# Patient Record
Sex: Female | Born: 1990 | Race: White | Hispanic: No
Health system: Southern US, Academic
[De-identification: ages and names within clinical notes are randomized; demographics above are authoritative.]

## PROBLEM LIST (undated history)

## (undated) ENCOUNTER — Encounter
Attending: Pharmacist Clinician (PhC)/ Clinical Pharmacy Specialist | Primary: Pharmacist Clinician (PhC)/ Clinical Pharmacy Specialist

## (undated) ENCOUNTER — Encounter

## (undated) ENCOUNTER — Encounter: Attending: Hematology & Oncology | Primary: Hematology & Oncology

## (undated) ENCOUNTER — Ambulatory Visit: Payer: PRIVATE HEALTH INSURANCE

## (undated) ENCOUNTER — Telehealth
Attending: Pharmacist Clinician (PhC)/ Clinical Pharmacy Specialist | Primary: Pharmacist Clinician (PhC)/ Clinical Pharmacy Specialist

## (undated) ENCOUNTER — Ambulatory Visit

## (undated) ENCOUNTER — Telehealth: Attending: Hematology & Oncology | Primary: Hematology & Oncology

## (undated) ENCOUNTER — Inpatient Hospital Stay: Payer: Self-pay

## (undated) DIAGNOSIS — F319 Bipolar disorder, unspecified: Secondary | ICD-10-CM

## (undated) DIAGNOSIS — J45909 Unspecified asthma, uncomplicated: Secondary | ICD-10-CM

## (undated) DIAGNOSIS — F329 Major depressive disorder, single episode, unspecified: Secondary | ICD-10-CM

## (undated) DIAGNOSIS — F1990 Other psychoactive substance use, unspecified, uncomplicated: Secondary | ICD-10-CM

## (undated) DIAGNOSIS — F32A Depression, unspecified: Secondary | ICD-10-CM

## (undated) DIAGNOSIS — F1290 Cannabis use, unspecified, uncomplicated: Secondary | ICD-10-CM

## (undated) DIAGNOSIS — R569 Unspecified convulsions: Secondary | ICD-10-CM

## (undated) DIAGNOSIS — F419 Anxiety disorder, unspecified: Secondary | ICD-10-CM

## (undated) DIAGNOSIS — D649 Anemia, unspecified: Secondary | ICD-10-CM

## (undated) DIAGNOSIS — I959 Hypotension, unspecified: Secondary | ICD-10-CM

## (undated) DIAGNOSIS — A6000 Herpesviral infection of urogenital system, unspecified: Secondary | ICD-10-CM

## (undated) HISTORY — DX: Bipolar disorder, unspecified: F31.9

## (undated) HISTORY — DX: Anxiety disorder, unspecified: F41.9

## (undated) HISTORY — DX: Depression, unspecified: F32.A

## (undated) HISTORY — DX: Cannabis use, unspecified, uncomplicated: F12.90

## (undated) HISTORY — DX: Major depressive disorder, single episode, unspecified: F32.9

---
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
## 2011-04-06 ENCOUNTER — Ambulatory Visit: Payer: Self-pay

## 2013-01-12 ENCOUNTER — Emergency Department: Payer: Self-pay | Admitting: Emergency Medicine

## 2013-01-12 LAB — CBC
HCT: 43.8 % (ref 35.0–47.0)
MCH: 31.7 pg (ref 26.0–34.0)
Platelet: 268 10*3/uL (ref 150–440)
RBC: 4.73 10*6/uL (ref 3.80–5.20)

## 2013-01-12 LAB — COMPREHENSIVE METABOLIC PANEL
Albumin: 4 g/dL (ref 3.4–5.0)
Alkaline Phosphatase: 87 U/L (ref 50–136)
Co2: 20 mmol/L — ABNORMAL LOW (ref 21–32)
EGFR (Non-African Amer.): >60
Glucose: 86 mg/dL (ref 65–99)
Osmolality: 277 (ref 275–301)
Potassium: 4 mmol/L (ref 3.5–5.1)
SGPT (ALT): 19 U/L (ref 12–78)
Sodium: 139 mmol/L (ref 136–145)
Total Protein: 8.3 g/dL — ABNORMAL HIGH (ref 6.4–8.2)

## 2013-01-12 LAB — URINALYSIS, COMPLETE
Bilirubin,UR: NEGATIVE
Blood: NEGATIVE
Glucose,UR: NEGATIVE mg/dL (ref 0–75)
Ketone: NEGATIVE
Nitrite: NEGATIVE
Protein: NEGATIVE
RBC,UR: 9 /HPF (ref 0–5)
Specific Gravity: 1.027 (ref 1.003–1.030)
WBC UR: 12 /HPF (ref 0–5)

## 2013-01-12 LAB — DRUG SCREEN, URINE
Amphetamines, Ur Screen: NEGATIVE (ref ?–1000)
Barbiturates, Ur Screen: NEGATIVE (ref ?–200)
Cocaine Metabolite,Ur ~~LOC~~: POSITIVE (ref ?–300)
MDMA (Ecstasy)Ur Screen: NEGATIVE (ref ?–500)
Methadone, Ur Screen: NEGATIVE (ref ?–300)
Phencyclidine (PCP) Ur S: NEGATIVE (ref ?–25)

## 2013-01-12 LAB — HCG, QUANTITATIVE, PREGNANCY: Beta Hcg, Quant.: <1 m[IU]/mL — ABNORMAL LOW

## 2013-01-12 LAB — TSH: Thyroid Stimulating Horm: 0.76 u[IU]/mL

## 2013-01-21 ENCOUNTER — Emergency Department: Payer: Self-pay | Admitting: Emergency Medicine

## 2013-01-21 LAB — BASIC METABOLIC PANEL
Anion Gap: 6 — ABNORMAL LOW (ref 7–16)
BUN: 7 mg/dL (ref 7–18)
Chloride: 104 mmol/L (ref 98–107)
Co2: 29 mmol/L (ref 21–32)
Creatinine: 0.78 mg/dL (ref 0.60–1.30)
Osmolality: 275 (ref 275–301)

## 2013-01-21 LAB — CBC
HCT: 38.9 % (ref 35.0–47.0)
MCHC: 34.6 g/dL (ref 32.0–36.0)
Platelet: 190 10*3/uL (ref 150–440)
RBC: 4.23 10*6/uL (ref 3.80–5.20)
WBC: 6 10*3/uL (ref 3.6–11.0)

## 2013-01-21 LAB — CARBAMAZEPINE LEVEL, TOTAL: Carbamazepine: 9.1 ug/mL (ref 4.0–12.0)

## 2013-02-06 ENCOUNTER — Emergency Department: Payer: Self-pay | Admitting: Emergency Medicine

## 2013-06-16 ENCOUNTER — Emergency Department: Payer: Self-pay | Admitting: Emergency Medicine

## 2013-06-16 LAB — URINALYSIS, COMPLETE
BACTERIA: NONE SEEN
BILIRUBIN, UR: NEGATIVE
Blood: NEGATIVE
Glucose,UR: NEGATIVE mg/dL (ref 0–75)
Ketone: NEGATIVE
NITRITE: NEGATIVE
PH: 7 (ref 4.5–8.0)
Protein: NEGATIVE
RBC, UR: NONE SEEN /HPF (ref 0–5)
Specific Gravity: 1.005 (ref 1.003–1.030)
Squamous Epithelial: 18
WBC UR: 4 /HPF (ref 0–5)

## 2013-06-16 LAB — COMPREHENSIVE METABOLIC PANEL
ALK PHOS: 70 U/L
ALT: 12 U/L (ref 12–78)
ANION GAP: 7 (ref 7–16)
AST: 10 U/L — AB (ref 15–37)
Albumin: 4.2 g/dL (ref 3.4–5.0)
BUN: 7 mg/dL (ref 7–18)
Bilirubin,Total: 0.4 mg/dL (ref 0.2–1.0)
CO2: 27 mmol/L (ref 21–32)
CREATININE: 0.83 mg/dL (ref 0.60–1.30)
Calcium, Total: 9.3 mg/dL (ref 8.5–10.1)
Chloride: 104 mmol/L (ref 98–107)
EGFR (Non-African Amer.): >60
Glucose: 83 mg/dL (ref 65–99)
OSMOLALITY: 273 (ref 275–301)
Potassium: 3.4 mmol/L — ABNORMAL LOW (ref 3.5–5.1)
Sodium: 138 mmol/L (ref 136–145)
TOTAL PROTEIN: 7.7 g/dL (ref 6.4–8.2)

## 2013-06-16 LAB — GC/CHLAMYDIA PROBE AMP

## 2013-06-16 LAB — CBC
HCT: 42.2 % (ref 35.0–47.0)
HGB: 14.3 g/dL (ref 12.0–16.0)
MCH: 31.3 pg (ref 26.0–34.0)
MCHC: 33.9 g/dL (ref 32.0–36.0)
MCV: 92 fL (ref 80–100)
PLATELETS: 236 10*3/uL (ref 150–440)
RBC: 4.58 10*6/uL (ref 3.80–5.20)
RDW: 12.9 % (ref 11.5–14.5)
WBC: 9.5 10*3/uL (ref 3.6–11.0)

## 2013-06-16 LAB — WET PREP, GENITAL

## 2013-06-16 LAB — LIPASE, BLOOD: LIPASE: 70 U/L — AB (ref 73–393)

## 2013-10-30 ENCOUNTER — Emergency Department: Payer: Self-pay | Admitting: Emergency Medicine

## 2013-10-30 LAB — URINALYSIS, COMPLETE
BILIRUBIN, UR: NEGATIVE
BLOOD: NEGATIVE
Glucose,UR: NEGATIVE mg/dL (ref 0–75)
Ketone: NEGATIVE
Nitrite: NEGATIVE
PH: 5 (ref 4.5–8.0)
RBC,UR: 27 /HPF (ref 0–5)
Specific Gravity: 1.03 (ref 1.003–1.030)
WBC UR: 196 /HPF (ref 0–5)

## 2013-10-30 LAB — PREGNANCY, URINE: PREGNANCY TEST, URINE: NEGATIVE m[IU]/mL

## 2013-12-23 ENCOUNTER — Emergency Department: Payer: Self-pay | Admitting: Emergency Medicine

## 2013-12-31 ENCOUNTER — Emergency Department: Payer: Self-pay | Admitting: Emergency Medicine

## 2014-01-01 LAB — CBC WITH DIFFERENTIAL/PLATELET
Basophil #: 0 10*3/uL (ref 0.0–0.1)
Basophil %: 0.1 %
EOS PCT: 0 %
Eosinophil #: 0 10*3/uL (ref 0.0–0.7)
HCT: 39.5 % (ref 35.0–47.0)
HGB: 13.6 g/dL (ref 12.0–16.0)
Lymphocyte #: 1.4 10*3/uL (ref 1.0–3.6)
Lymphocyte %: 12.5 %
MCH: 32.2 pg (ref 26.0–34.0)
MCHC: 34.3 g/dL (ref 32.0–36.0)
MCV: 94 fL (ref 80–100)
Monocyte #: 0.5 x10 3/mm (ref 0.2–0.9)
Monocyte %: 4.4 %
Neutrophil #: 9.6 10*3/uL — ABNORMAL HIGH (ref 1.4–6.5)
Neutrophil %: 83 %
PLATELETS: 226 10*3/uL (ref 150–440)
RBC: 4.21 10*6/uL (ref 3.80–5.20)
RDW: 12.8 % (ref 11.5–14.5)
WBC: 11.6 10*3/uL — ABNORMAL HIGH (ref 3.6–11.0)

## 2014-01-01 LAB — URINALYSIS, COMPLETE
Bacteria: NONE SEEN
Bilirubin,UR: NEGATIVE
Blood: NEGATIVE
GLUCOSE, UR: NEGATIVE mg/dL (ref 0–75)
Leukocyte Esterase: NEGATIVE
Nitrite: NEGATIVE
PH: 6 (ref 4.5–8.0)
PROTEIN: NEGATIVE
RBC,UR: <1 /HPF (ref 0–5)
Specific Gravity: 1.011 (ref 1.003–1.030)
Squamous Epithelial: 5
WBC UR: 2 /HPF (ref 0–5)

## 2014-01-01 LAB — COMPREHENSIVE METABOLIC PANEL
Albumin: 3.8 g/dL (ref 3.4–5.0)
Alkaline Phosphatase: 59 U/L
Anion Gap: 10 (ref 7–16)
BUN: 4 mg/dL — ABNORMAL LOW (ref 7–18)
Bilirubin,Total: 0.3 mg/dL (ref 0.2–1.0)
CHLORIDE: 104 mmol/L (ref 98–107)
CO2: 25 mmol/L (ref 21–32)
Calcium, Total: 8.9 mg/dL (ref 8.5–10.1)
Creatinine: 0.49 mg/dL — ABNORMAL LOW (ref 0.60–1.30)
GLUCOSE: 82 mg/dL (ref 65–99)
Osmolality: 274 (ref 275–301)
Potassium: 3.3 mmol/L — ABNORMAL LOW (ref 3.5–5.1)
SGOT(AST): 10 U/L — ABNORMAL LOW (ref 15–37)
SGPT (ALT): 15 U/L
SODIUM: 139 mmol/L (ref 136–145)
Total Protein: 7.6 g/dL (ref 6.4–8.2)

## 2014-01-01 LAB — HCG, QUANTITATIVE, PREGNANCY: BETA HCG, QUANT.: 87575 m[IU]/mL — AB

## 2014-01-01 LAB — LIPASE, BLOOD: Lipase: 63 U/L — ABNORMAL LOW (ref 73–393)

## 2014-02-10 ENCOUNTER — Emergency Department: Payer: Self-pay | Admitting: Student

## 2014-02-10 LAB — COMPREHENSIVE METABOLIC PANEL
ALT: 17 U/L
AST: 10 U/L — AB (ref 15–37)
Albumin: 3.3 g/dL — ABNORMAL LOW (ref 3.4–5.0)
Alkaline Phosphatase: 55 U/L
Anion Gap: 8 (ref 7–16)
BUN: 4 mg/dL — ABNORMAL LOW (ref 7–18)
Bilirubin,Total: 0.3 mg/dL (ref 0.2–1.0)
CHLORIDE: 105 mmol/L (ref 98–107)
Calcium, Total: 8.7 mg/dL (ref 8.5–10.1)
Co2: 25 mmol/L (ref 21–32)
Creatinine: 0.57 mg/dL — ABNORMAL LOW (ref 0.60–1.30)
EGFR (African American): >60
Glucose: 89 mg/dL (ref 65–99)
Osmolality: 272 (ref 275–301)
POTASSIUM: 3.3 mmol/L — AB (ref 3.5–5.1)
Sodium: 138 mmol/L (ref 136–145)
TOTAL PROTEIN: 7.2 g/dL (ref 6.4–8.2)

## 2014-02-10 LAB — URINALYSIS, COMPLETE
BILIRUBIN, UR: NEGATIVE
BLOOD: NEGATIVE
GLUCOSE, UR: NEGATIVE mg/dL (ref 0–75)
KETONE: NEGATIVE
Leukocyte Esterase: NEGATIVE
NITRITE: NEGATIVE
PH: 7 (ref 4.5–8.0)
Protein: NEGATIVE
RBC,UR: 2 /HPF (ref 0–5)
SPECIFIC GRAVITY: 1.017 (ref 1.003–1.030)
Squamous Epithelial: 2

## 2014-02-10 LAB — CBC WITH DIFFERENTIAL/PLATELET
BASOS ABS: 0 10*3/uL (ref 0.0–0.1)
BASOS PCT: 0.3 %
EOS PCT: 0 %
Eosinophil #: 0 10*3/uL (ref 0.0–0.7)
HCT: 38.6 % (ref 35.0–47.0)
HGB: 13.2 g/dL (ref 12.0–16.0)
Lymphocyte #: 1.4 10*3/uL (ref 1.0–3.6)
Lymphocyte %: 15.2 %
MCH: 33 pg (ref 26.0–34.0)
MCHC: 34.3 g/dL (ref 32.0–36.0)
MCV: 96 fL (ref 80–100)
MONOS PCT: 6.8 %
Monocyte #: 0.6 x10 3/mm (ref 0.2–0.9)
NEUTROS ABS: 7.1 10*3/uL — AB (ref 1.4–6.5)
Neutrophil %: 77.7 %
Platelet: 209 10*3/uL (ref 150–440)
RBC: 4.02 10*6/uL (ref 3.80–5.20)
RDW: 13.2 % (ref 11.5–14.5)
WBC: 9.1 10*3/uL (ref 3.6–11.0)

## 2014-02-10 LAB — HCG, QUANTITATIVE, PREGNANCY: BETA HCG, QUANT.: 28380 m[IU]/mL — AB

## 2014-02-10 LAB — LIPASE, BLOOD: LIPASE: 74 U/L (ref 73–393)

## 2014-02-12 ENCOUNTER — Emergency Department: Payer: Self-pay | Admitting: Emergency Medicine

## 2014-02-12 LAB — CBC WITH DIFFERENTIAL/PLATELET
BASOS ABS: 0 10*3/uL (ref 0.0–0.1)
BASOS PCT: 0.4 %
EOS ABS: 0 10*3/uL (ref 0.0–0.7)
Eosinophil %: 0.1 %
HCT: 36.2 % (ref 35.0–47.0)
HGB: 12.3 g/dL (ref 12.0–16.0)
Lymphocyte #: 1.1 10*3/uL (ref 1.0–3.6)
Lymphocyte %: 11.6 %
MCH: 32.5 pg (ref 26.0–34.0)
MCHC: 33.9 g/dL (ref 32.0–36.0)
MCV: 96 fL (ref 80–100)
MONOS PCT: 5.7 %
Monocyte #: 0.6 x10 3/mm (ref 0.2–0.9)
NEUTROS ABS: 8.1 10*3/uL — AB (ref 1.4–6.5)
Neutrophil %: 82.2 %
Platelet: 213 10*3/uL (ref 150–440)
RBC: 3.78 10*6/uL — AB (ref 3.80–5.20)
RDW: 13.3 % (ref 11.5–14.5)
WBC: 9.8 10*3/uL (ref 3.6–11.0)

## 2014-02-12 LAB — URINALYSIS, COMPLETE
BLOOD: NEGATIVE
Bilirubin,UR: NEGATIVE
Glucose,UR: NEGATIVE mg/dL (ref 0–75)
Ketone: NEGATIVE
Leukocyte Esterase: NEGATIVE
NITRITE: NEGATIVE
PH: 7 (ref 4.5–8.0)
Protein: NEGATIVE
SPECIFIC GRAVITY: 1.005 (ref 1.003–1.030)
Squamous Epithelial: 2
WBC UR: <1 /HPF (ref 0–5)

## 2014-02-12 LAB — COMPREHENSIVE METABOLIC PANEL
ALT: 17 U/L
ANION GAP: 5 — AB (ref 7–16)
Albumin: 3 g/dL — ABNORMAL LOW (ref 3.4–5.0)
Alkaline Phosphatase: 52 U/L
BILIRUBIN TOTAL: 0.2 mg/dL (ref 0.2–1.0)
BUN: 5 mg/dL — ABNORMAL LOW (ref 7–18)
CO2: 29 mmol/L (ref 21–32)
Calcium, Total: 8.5 mg/dL (ref 8.5–10.1)
Chloride: 106 mmol/L (ref 98–107)
Creatinine: 0.54 mg/dL — ABNORMAL LOW (ref 0.60–1.30)
EGFR (African American): >60
Glucose: 105 mg/dL — ABNORMAL HIGH (ref 65–99)
Osmolality: 277 (ref 275–301)
Potassium: 3.3 mmol/L — ABNORMAL LOW (ref 3.5–5.1)
SGOT(AST): 18 U/L (ref 15–37)
SODIUM: 140 mmol/L (ref 136–145)
Total Protein: 6.6 g/dL (ref 6.4–8.2)

## 2014-02-12 LAB — HCG, QUANTITATIVE, PREGNANCY: Beta Hcg, Quant.: 28935 m[IU]/mL — ABNORMAL HIGH

## 2014-03-16 NOTE — L&D Delivery Note (Signed)
Deliver Note   Date of Delivery:   07/20/2014 Primary OB:   [facility]  Gestational Age/EDD: 6071w3d by 07/24/2014, by Last Menstrual Period  Antepartum complications:  OB History    Gravida Para Term Preterm AB TAB SAB Ectopic Multiple Living   2 1 1       1       Delivered By:   Vena AustriaStaebler, Adhya Cocco MD  Delivery Type:  NSVD  Anesthesia:   Epidural    Intrapartum complications:  GBS:    Negative (05/06 0820) Laceration:     none Episiotomy:    none Placenta:    Spontaneous Estimated Blood Loss:   400mL Baby:    Liveborn female , APGAR (1 MIN): 9   APGAR (5 MINS): 9   APGAR (10 MINS):  , weight  7lbs 0oz   Deliver Details   At  a female infant was delivered via  NSVD (Presentation: OA  ).  APGAR: , ; weight 7lbs 0oz.   Placenta status delivered spontaneously and intact,  3 Vessel Cord,  with no complications: Marland Kitchen.    Mom to postpartum.  Baby to Couplet care / Skin to Skin.

## 2014-05-15 LAB — OB RESULTS CONSOLE ABO/RH: RH Type: POSITIVE

## 2014-05-15 LAB — OB RESULTS CONSOLE HGB/HCT, BLOOD
HCT: 33 %
Hemoglobin: 11.2 g/dL

## 2014-05-15 LAB — OB RESULTS CONSOLE VARICELLA ZOSTER ANTIBODY, IGG: Varicella: IMMUNE

## 2014-05-15 LAB — OB RESULTS CONSOLE RPR: RPR: NONREACTIVE

## 2014-05-15 LAB — OB RESULTS CONSOLE RUBELLA ANTIBODY, IGM: Rubella: IMMUNE

## 2014-05-15 LAB — OB RESULTS CONSOLE HEPATITIS B SURFACE ANTIGEN: Hepatitis B Surface Ag: NEGATIVE

## 2014-05-15 LAB — OB RESULTS CONSOLE GC/CHLAMYDIA
CHLAMYDIA, DNA PROBE: NEGATIVE
GC PROBE AMP, GENITAL: NEGATIVE

## 2014-05-15 LAB — OB RESULTS CONSOLE HIV ANTIBODY (ROUTINE TESTING): HIV: NONREACTIVE

## 2014-05-15 LAB — OB RESULTS CONSOLE ANTIBODY SCREEN: Antibody Screen: NEGATIVE

## 2014-05-18 ENCOUNTER — Observation Stay: Payer: Self-pay | Admitting: Obstetrics & Gynecology

## 2014-06-10 ENCOUNTER — Observation Stay: Payer: Self-pay | Admitting: Obstetrics and Gynecology

## 2014-06-10 LAB — BASIC METABOLIC PANEL
Anion Gap: 9 (ref 7–16)
BUN: 8 mg/dL
CHLORIDE: 104 mmol/L
Calcium, Total: 8.5 mg/dL — ABNORMAL LOW
Co2: 24 mmol/L
Creatinine: 0.41 mg/dL — ABNORMAL LOW
Glucose: 79 mg/dL
Potassium: 3.6 mmol/L
SODIUM: 137 mmol/L

## 2014-06-10 LAB — URINALYSIS, COMPLETE
BLOOD: NEGATIVE
Glucose,UR: NEGATIVE mg/dL (ref 0–75)
Leukocyte Esterase: NEGATIVE
NITRITE: NEGATIVE
PH: 6 (ref 4.5–8.0)
Specific Gravity: 1.031 (ref 1.003–1.030)
Squamous Epithelial: 3
WBC UR: 2 /HPF (ref 0–5)

## 2014-06-10 LAB — AMYLASE: Amylase: 44 U/L

## 2014-06-10 LAB — LIPASE, BLOOD: Lipase: 19 U/L — ABNORMAL LOW

## 2014-06-11 ENCOUNTER — Observation Stay: Payer: Self-pay

## 2014-06-11 LAB — HEPATIC FUNCTION PANEL A (ARMC)
ALT: 9 U/L — AB
AST: 15 U/L
Albumin: 2.9 g/dL — ABNORMAL LOW
Alkaline Phosphatase: 111 U/L
Bilirubin, Direct: <0.1 mg/dL
Bilirubin,Total: 0.4 mg/dL
Total Protein: 6 g/dL — ABNORMAL LOW

## 2014-06-11 LAB — URINALYSIS, COMPLETE
BLOOD: NEGATIVE
Bilirubin,UR: NEGATIVE
Glucose,UR: NEGATIVE mg/dL (ref 0–75)
NITRITE: NEGATIVE
Ph: 6 (ref 4.5–8.0)
RBC,UR: 2 /HPF (ref 0–5)
SPECIFIC GRAVITY: 1.023 (ref 1.003–1.030)
Squamous Epithelial: 33
WBC UR: 18 /HPF (ref 0–5)

## 2014-07-05 ENCOUNTER — Observation Stay
Admit: 2014-07-05 | Disposition: A | Discharge status: Home / Self Care | Payer: Self-pay | Attending: Obstetrics and Gynecology | Admitting: Obstetrics and Gynecology

## 2014-07-06 NOTE — Consult Note (Signed)
Brief Consult Note: Diagnosis: Bipolar disorder NOS, polysubstance abuse.   Patient was seen by consultant.   Consult note dictated.   Recommend further assessment or treatment.   Orders entered.   Comments: Ms. Bailey Cabrera has a h/o depression, mood instability, psychosis and substance abuse. She came to the ER asking for help with "bipolar". She declines substance abuse treatment. She is not suicidal or homicidal.   PLAN: 1. The patient does not meet criteria for IVC. Please discharge as appropriate.  2. Will start Tegretol and Risperdal for mood stabilization and Trazodone for sleep. RX given.  3. She will follow up with [facility]. Info was given.  Electronic Signatures: Kristine LineaPucilowska, Adama Ferber (MD)  (Signed 31-Oct-14 12:14)  Authored: Brief Consult Note   Last Updated: 31-Oct-14 12:14 by Kristine LineaPucilowska, Akeema Broder (MD)

## 2014-07-06 NOTE — Consult Note (Signed)
PATIENT NAME:  Bailey Cabrera, Bailey Cabrera MR#:  [id number] DATE OF BIRTH:  [birth year]  DATE OF ADMISSION:  01/12/2013 DATE OF CONSULTATION:  01/13/2013  REFERRING PHYSICIAN:  Dr. Francene Castle. CONSULTING PHYSICIAN:  Isrrael Fluckiger B. Montie Gelardi, MD  REASON FOR CONSULTATION: To evaluate a patient with substance abuse.     IDENTIFYING DATA: Bailey Cabrera is a 24 year old female with a history of substance abuse.   CHIEF COMPLAINT: "I want to see my baby."   HISTORY OF PRESENT ILLNESS: Bailey Cabrera had an argument with her husband. Her grandmother, who felt that the husband was threatening, called police, and the husband was taken to the hospital. The patient came to the hospital later asking for cocaine detox. She reportedly has a bad cocaine habit, and in the past 3 days, she used $1500 worth of cocaine. She stole the money from her grandmother to pay for the drug.  Once in the Emergency Room, however, she changed her mind and no longer wanted substance abuse treatment. To the contrary, she minimized her habit and wanted to be discharged to home to be with her son.  She believes that her grandmother needs to work tonight, and the baby will have no caretaker. She does not believe that social services have ever been involved, but our Education officer, museum did call DSS child protective services to check on this family and possibly provide them with additional resources. The patient reports that she has been diagnosed with bipolar disorder, and the past  was treated with Celexa. She reports having auditory hallucinations. At times, the voices telling her to get more drugs. She has passing thoughts of suicide, but denies any current suicidal ideations. The patient had been a patient at [facility] but had stopped going there 1 year ago, as she was unhappy with the services. She now believes that she must be started on medication for bipolar. Her husband is bipolar, and she seems to recognize symptoms of the illness in herself now. She  believes that the husband does better when on medications, but he has been off medications for 2 weeks, and she believes the argument last night was caused by medication noncompliance. She fails to acknowledge that they were using cocaine for the past 3 days to no end.   PAST PSYCHIATRIC HISTORY: She has never been hospitalized. There are no suicide attempts. She is no a patient at [facility], but has every intention to start working with [facility] and spoke with Lanae Boast about it already.   FAMILY PSYCHIATRIC HISTORY: Both her mother and father are bipolar.   PAST MEDICAL HISTORY: None.   ALLERGIES: No known drug allergies.   MEDICATIONS ON ADMISSION: Lexapro 20 mg, Xanax 0.5 mg as needed, albuterol. It is unlikely that the patient was prescribed Xanax or Lexapro, as she is unable to indicate a provider.   SOCIAL HISTORY: She is married. She has a 34-year-old. She is unemployed. She lives with her grandmother. There is a conflict with her husband, who is on probation and possibly in trouble with the law now.   REVIEW OF SYSTEMS:  CONSTITUTIONAL: No fevers or chills. No weight changes.  EYES: No double or blurred vision.  ENT: No hearing loss.  RESPIRATORY: No shortness of breath or cough.  CARDIOVASCULAR: No chest pain or orthopnea.  GASTROINTESTINAL: No abdominal pain, nausea, vomiting or diarrhea.   GENITOURINARY: No incontinence or frequency.  ENDOCRINE: No heat or cold intolerance.  LYMPHATIC: No anemia or easy bruising.  INTEGUMENTARY: No acne or rash.  MUSCULOSKELETAL: No  muscle or joint pain.  NEUROLOGIC: No tingling or weakness.  PSYCHIATRIC: See history of present illness for details.   PHYSICAL EXAMINATION: VITAL SIGNS: Blood pressure 99/62, pulse 76, respirations 18, temperature 97.8.  GENERAL: This is a young, slender female in no acute distress. The rest of the physical examination is deferred to her primary attending.   LABORATORY DATA: Chemistries are within normal limits. Blood  alcohol level is 0. LFTs within normal limits. TSH 0.76. Urine tox screen positive for cocaine and cannabinoids. CBC within normal limits. Urinalysis is suggestive of urinary tract infection with leukocyte esterase 1+ and 12 white blood cells per field. Urine pregnancy test is negative. Beta hCG subunit less than 1.   MENTAL STATUS EXAMINATION: The patient is alert and oriented to person, place, time and situation. She is pleasant, polite and cooperative. She is well groomed. She is wearing hospital scrubs. She maintains good eye contact. Her speech is of normal rate, rhythm and volume. Mood is anxious with tearful affect. Thought process is logical and goal oriented. Thought content: She denies suicidal or homicidal ideation. There are no delusions or paranoia. There are no auditory or visual hallucinations. Her cognition is grossly intact. She registers three out of three and recalls three out of three objects after five minutes. She can spell "world" forward and backward. She knows the current president. Her insight and judgment are limited.   DIAGNOSES: AXIS I: Mood disorder, not otherwise specified. Cocaine dependence, Cannabinoid dependence.  AXIS II: Deferred.  AXIS III: Deferred.  AXIS IV: Mental illness, treatment compliance, substance abuse, family conflict.  AXIS V: Global Assessment of Functioning 45.   PLAN: The patient does not meet criteria for involuntary inpatient psychiatric commitment. Please discharge as appropriate. I will start Tegretol and Risperdal for mood stabilization and trazodone for sleep. Prescriptions were given. The patient will follow up with [facility]. She received information from [place] from community support team of [facility], who met with her in the Emergency Room. The patient declines substance abuse treatment and minimizes her use, in spite of evidence to the contrary and the fact that she stole money from her grandmother to support her habit. Social worker DSS to make them  aware of the problem this young family may experience.    ____________________________ Wardell Honour. Bary Leriche, MD jbp:dmm D: 01/13/2013 20:39:03 ET T: 01/13/2013 21:16:34 ET JOB#: [id number]  cc: Darelle Kings B. Bary Leriche, MD, <Dictator> Clovis Fredrickson MD ELECTRONICALLY SIGNED 01/15/2013 22:09

## 2014-07-09 ENCOUNTER — Observation Stay
Admit: 2014-07-09 | Disposition: A | Discharge status: Home / Self Care | Payer: Self-pay | Attending: Certified Nurse Midwife | Admitting: Certified Nurse Midwife

## 2014-07-09 LAB — PIH PROFILE
Anion Gap: 8 (ref 7–16)
BUN: 10 mg/dL
CALCIUM: 8.4 mg/dL — AB
CHLORIDE: 104 mmol/L
CREATININE: 0.56 mg/dL
Co2: 22 mmol/L
EGFR (African American): >60
Glucose: 118 mg/dL — ABNORMAL HIGH
HCT: 35.4 % (ref 35.0–47.0)
HGB: 11.8 g/dL — ABNORMAL LOW (ref 12.0–16.0)
MCH: 29.6 pg (ref 26.0–34.0)
MCHC: 33.2 g/dL (ref 32.0–36.0)
MCV: 89 fL (ref 80–100)
PLATELETS: 260 10*3/uL (ref 150–440)
Potassium: 3.3 mmol/L — ABNORMAL LOW
RBC: 3.97 10*6/uL (ref 3.80–5.20)
RDW: 14.9 % — AB (ref 11.5–14.5)
SGOT(AST): 23 U/L
Sodium: 134 mmol/L — ABNORMAL LOW
Uric Acid: 5.7 mg/dL
WBC: 13.3 10*3/uL — ABNORMAL HIGH (ref 3.6–11.0)

## 2014-07-09 LAB — DRUG SCREEN, URINE
AMPHETAMINES, UR SCREEN: NEGATIVE
BENZODIAZEPINE, UR SCRN: NEGATIVE
Barbiturates, Ur Screen: NEGATIVE
Cannabinoid 50 Ng, Ur ~~LOC~~: NEGATIVE
Cocaine Metabolite,Ur ~~LOC~~: NEGATIVE
MDMA (Ecstasy)Ur Screen: NEGATIVE
METHADONE, UR SCREEN: NEGATIVE
OPIATE, UR SCREEN: POSITIVE
Phencyclidine (PCP) Ur S: NEGATIVE
Tricyclic, Ur Screen: NEGATIVE

## 2014-07-09 LAB — PROTEIN / CREATININE RATIO, URINE
Creatinine, Urine Random: 266 mg/dL (ref 30–125)
PROTEIN, URINE: 25 mg/dL (ref 0–9)
Protein/Creat. Ratio: 94 mg/gCREAT (ref 0–200)

## 2014-07-20 ENCOUNTER — Inpatient Hospital Stay: Payer: Medicaid Other | Admitting: Registered Nurse

## 2014-07-20 ENCOUNTER — Inpatient Hospital Stay
Admission: EM | Admit: 2014-07-20 | Discharge: 2014-07-22 | DRG: 775 | Disposition: A | Discharge status: Home / Self Care | Payer: Medicaid Other | Attending: Obstetrics and Gynecology | Admitting: Obstetrics and Gynecology

## 2014-07-20 DIAGNOSIS — F319 Bipolar disorder, unspecified: Secondary | ICD-10-CM | POA: Insufficient documentation

## 2014-07-20 DIAGNOSIS — Z3A39 39 weeks gestation of pregnancy: Secondary | ICD-10-CM | POA: Diagnosis present

## 2014-07-20 DIAGNOSIS — F1110 Opioid abuse, uncomplicated: Secondary | ICD-10-CM | POA: Diagnosis present

## 2014-07-20 DIAGNOSIS — O99344 Other mental disorders complicating childbirth: Secondary | ICD-10-CM | POA: Diagnosis present

## 2014-07-20 DIAGNOSIS — D62 Acute posthemorrhagic anemia: Secondary | ICD-10-CM | POA: Diagnosis present

## 2014-07-20 DIAGNOSIS — F1290 Cannabis use, unspecified, uncomplicated: Secondary | ICD-10-CM | POA: Diagnosis not present

## 2014-07-20 DIAGNOSIS — F419 Anxiety disorder, unspecified: Secondary | ICD-10-CM | POA: Diagnosis present

## 2014-07-20 DIAGNOSIS — A6000 Herpesviral infection of urogenital system, unspecified: Secondary | ICD-10-CM | POA: Diagnosis present

## 2014-07-20 DIAGNOSIS — F329 Major depressive disorder, single episode, unspecified: Secondary | ICD-10-CM | POA: Diagnosis present

## 2014-07-20 DIAGNOSIS — F32A Depression, unspecified: Secondary | ICD-10-CM | POA: Insufficient documentation

## 2014-07-20 DIAGNOSIS — F1210 Cannabis abuse, uncomplicated: Secondary | ICD-10-CM | POA: Diagnosis present

## 2014-07-20 DIAGNOSIS — O358XX0 Maternal care for other (suspected) fetal abnormality and damage, not applicable or unspecified: Principal | ICD-10-CM | POA: Diagnosis present

## 2014-07-20 DIAGNOSIS — O35EXX0 Maternal care for other (suspected) fetal abnormality and damage, fetal genitourinary anomalies, not applicable or unspecified: Secondary | ICD-10-CM | POA: Diagnosis present

## 2014-07-20 HISTORY — DX: Anemia, unspecified: D64.9

## 2014-07-20 HISTORY — DX: Herpesviral infection of urogenital system, unspecified: A60.00

## 2014-07-20 HISTORY — DX: Unspecified convulsions: R56.9

## 2014-07-20 LAB — CBC
HCT: 37.8 % (ref 35.0–47.0)
HEMOGLOBIN: 12.3 g/dL (ref 12.0–16.0)
MCH: 29 pg (ref 26.0–34.0)
MCHC: 32.6 g/dL (ref 32.0–36.0)
MCV: 89 fL (ref 80.0–100.0)
Platelets: 239 10*3/uL (ref 150–440)
RBC: 4.24 MIL/uL (ref 3.80–5.20)
RDW: 15.4 % — ABNORMAL HIGH (ref 11.5–14.5)
WBC: 11.4 10*3/uL — ABNORMAL HIGH (ref 3.6–11.0)

## 2014-07-20 LAB — TYPE AND SCREEN
ABO/RH(D): A POS
Antibody Screen: NEGATIVE

## 2014-07-20 LAB — OB RESULTS CONSOLE GBS: GBS: NEGATIVE

## 2014-07-20 MED ORDER — BUPIVACAINE HCL (PF) 0.25 % IJ SOLN
INTRAMUSCULAR | Status: DC | PRN
  Administered 2014-07-20 (×2): 4 mL

## 2014-07-20 MED ORDER — MISOPROSTOL 200 MCG PO TABS
800.0000 ug | ORAL_TABLET | Freq: Once | ORAL | Status: DC | PRN
  Filled 2014-07-20: qty 4

## 2014-07-20 MED ORDER — SENNOSIDES-DOCUSATE SODIUM 8.6-50 MG PO TABS
2.0000 | ORAL_TABLET | ORAL | Status: DC
  Administered 2014-07-20 – 2014-07-22 (×2): 2 via ORAL
  Filled 2014-07-20 (×2): qty 2

## 2014-07-20 MED ORDER — ONDANSETRON HCL 4 MG/2ML IJ SOLN: 4.0000 mg | INTRAMUSCULAR | Status: DC | PRN

## 2014-07-20 MED ORDER — LIDOCAINE HCL (PF) 1 % IJ SOLN
30.0000 mL | INTRAMUSCULAR | Status: DC | PRN
Start: 2014-07-20 — End: 2014-07-20
  Filled 2014-07-20: qty 30

## 2014-07-20 MED ORDER — OXYCODONE-ACETAMINOPHEN 5-325 MG PO TABS: 1.0000 | ORAL_TABLET | ORAL | Status: DC | PRN

## 2014-07-20 MED ORDER — IBUPROFEN 600 MG PO TABS
600.0000 mg | ORAL_TABLET | Freq: Four times a day (QID) | ORAL | Status: DC
  Administered 2014-07-21 – 2014-07-22 (×6): 600 mg via ORAL
  Filled 2014-07-20 (×6): qty 1

## 2014-07-20 MED ORDER — LIDOCAINE-EPINEPHRINE (PF) 1.5 %-1:200000 IJ SOLN
INTRAMUSCULAR | Status: DC | PRN
  Administered 2014-07-20: 3 mL

## 2014-07-20 MED ORDER — SIMETHICONE 80 MG PO CHEW: 80.0000 mg | CHEWABLE_TABLET | ORAL | Status: DC | PRN

## 2014-07-20 MED ORDER — OXYCODONE-ACETAMINOPHEN 5-325 MG PO TABS
2.0000 | ORAL_TABLET | ORAL | Status: DC | PRN
  Administered 2014-07-20 – 2014-07-22 (×4): 2 via ORAL
  Filled 2014-07-20 (×4): qty 2

## 2014-07-20 MED ORDER — OXYTOCIN 10 UNIT/ML IJ SOLN
INTRAMUSCULAR | Status: AC
  Filled 2014-07-20: qty 2

## 2014-07-20 MED ORDER — FENTANYL 2.5 MCG/ML W/ROPIVACAINE 0.2% IN NS 100 ML EPIDURAL INFUSION (ARMC-ANES)
EPIDURAL | Status: AC
  Administered 2014-07-20: 9 mL/h via EPIDURAL
  Filled 2014-07-20: qty 100

## 2014-07-20 MED ORDER — METHYLERGONOVINE MALEATE 0.2 MG/ML IJ SOLN
INTRAMUSCULAR | Status: AC
  Filled 2014-07-20: qty 1

## 2014-07-20 MED ORDER — AMMONIA AROMATIC IN INHA
RESPIRATORY_TRACT | Status: AC
  Filled 2014-07-20: qty 10

## 2014-07-20 MED ORDER — LANOLIN HYDROUS EX OINT: TOPICAL_OINTMENT | CUTANEOUS | Status: DC | PRN

## 2014-07-20 MED ORDER — BUTORPHANOL TARTRATE 1 MG/ML IJ SOLN: 1.0000 mg | INTRAMUSCULAR | Status: DC | PRN

## 2014-07-20 MED ORDER — ACETAMINOPHEN 325 MG PO TABS: 650.0000 mg | ORAL_TABLET | ORAL | Status: DC | PRN

## 2014-07-20 MED ORDER — LACTATED RINGERS IV SOLN
500.0000 mL | INTRAVENOUS | Status: DC | PRN
  Administered 2014-07-20: 1000 mL via INTRAVENOUS
  Administered 2014-07-20 (×2): 200 mL via INTRAVENOUS

## 2014-07-20 MED ORDER — BENZOCAINE-MENTHOL 20-0.5 % EX AERO: 1.0000 "application " | INHALATION_SPRAY | CUTANEOUS | Status: DC | PRN

## 2014-07-20 MED ORDER — CEFAZOLIN SODIUM-DEXTROSE 2-3 GM-% IV SOLR
INTRAVENOUS | Status: AC
  Filled 2014-07-20: qty 50

## 2014-07-20 MED ORDER — TERBUTALINE SULFATE 1 MG/ML IJ SOLN
0.2500 mg | Freq: Once | INTRAMUSCULAR | Status: DC | PRN
  Filled 2014-07-20: qty 1

## 2014-07-20 MED ORDER — OXYTOCIN 40 UNITS IN LACTATED RINGERS INFUSION - SIMPLE MED: 62.5000 mL/h | INTRAVENOUS | Status: DC

## 2014-07-20 MED ORDER — DIPHENHYDRAMINE HCL 25 MG PO CAPS: 25.0000 mg | ORAL_CAPSULE | Freq: Four times a day (QID) | ORAL | Status: DC | PRN

## 2014-07-20 MED ORDER — OXYTOCIN 40 UNITS IN LACTATED RINGERS INFUSION - SIMPLE MED
INTRAVENOUS | Status: AC
  Filled 2014-07-20: qty 1000

## 2014-07-20 MED ORDER — IBUPROFEN 600 MG PO TABS
ORAL_TABLET | ORAL | Status: AC
  Administered 2014-07-20: 21:00:00
  Filled 2014-07-20: qty 1

## 2014-07-20 MED ORDER — LACTATED RINGERS IV SOLN
INTRAVENOUS | Status: DC
  Administered 2014-07-20 (×3): via INTRAVENOUS

## 2014-07-20 MED ORDER — ONDANSETRON HCL 4 MG/2ML IJ SOLN: 4.0000 mg | Freq: Four times a day (QID) | INTRAMUSCULAR | Status: DC | PRN

## 2014-07-20 MED ORDER — PRENATAL MULTIVITAMIN CH
1.0000 | ORAL_TABLET | Freq: Every day | ORAL | Status: DC
  Administered 2014-07-21: 1 via ORAL
  Filled 2014-07-20 (×2): qty 1

## 2014-07-20 MED ORDER — MISOPROSTOL 200 MCG PO TABS
ORAL_TABLET | ORAL | Status: AC
  Filled 2014-07-20: qty 4

## 2014-07-20 MED ORDER — LIDOCAINE HCL (PF) 1 % IJ SOLN
INTRAMUSCULAR | Status: AC
  Filled 2014-07-20: qty 30

## 2014-07-20 MED ORDER — ONDANSETRON HCL 4 MG PO TABS: 4.0000 mg | ORAL_TABLET | ORAL | Status: DC | PRN

## 2014-07-20 MED ORDER — OXYTOCIN 40 UNITS IN LACTATED RINGERS INFUSION - SIMPLE MED
1.0000 m[IU]/min | INTRAVENOUS | Status: DC
  Administered 2014-07-20: 1 m[IU]/min via INTRAVENOUS

## 2014-07-20 MED ORDER — CITRIC ACID-SODIUM CITRATE 334-500 MG/5ML PO SOLN
ORAL | Status: AC
  Filled 2014-07-20: qty 15

## 2014-07-20 MED ORDER — OXYTOCIN BOLUS FROM INFUSION
500.0000 mL | INTRAVENOUS | Status: DC
  Administered 2014-07-20: 500 mL via INTRAVENOUS

## 2014-07-20 MED ORDER — SODIUM CHLORIDE 0.9 % IJ SOLN
INTRAMUSCULAR | Status: AC
  Filled 2014-07-20: qty 6

## 2014-07-20 MED ORDER — OXYTOCIN 10 UNIT/ML IJ SOLN: 10.0000 [IU] | Freq: Once | INTRAMUSCULAR | Status: DC | PRN

## 2014-07-20 NOTE — Plan of Care (Signed)
CRNA here for epidural placement/time out called and verified

## 2014-07-20 NOTE — H&P (Signed)
Pt admitted for IOL for advanced cervical dilation and fetal hydronephrosis at 6363w3d. H&P in chart and reviewed. No changes notes. Pt is A+, VI, RI, GBS negative, and has received her TDAP.

## 2014-07-20 NOTE — Anesthesia Procedure Notes (Signed)
Epidural Patient location during procedure: OB Start time: 07/20/2014 1:35 PM End time: 07/20/2014 1:49 PM  Staffing Resident/CRNA: Stormy FabianURTIS, Aqib Lough Performed by: resident/CRNA   Preanesthetic Checklist Completed: patient identified, site marked, surgical consent, pre-op evaluation, timeout performed, IV checked, risks and benefits discussed and monitors and equipment checked  Epidural Patient position: sitting Prep: Betadine Patient monitoring: heart rate, continuous pulse ox and blood pressure Approach: midline Location: L3-L4 Injection technique: LOR saline  Needle:  Needle type: Tuohy  Needle gauge: 18 G Needle length: 9 cm and 9 Needle insertion depth: 5 cm Catheter type: closed end flexible Catheter size: 20 Guage Catheter at skin depth: 9 cm Test dose: negative and 1.5% lidocaine with Epi 1:200 K  Assessment Sensory level: T10 Events: blood not aspirated, injection not painful, no injection resistance, negative IV test and no paresthesia  Additional Notes Pt's history reviewed and consent obtained as per OB consent Patient tolerated the insertion well without complications. Negative SATD, negative IVTD All VSS were obtained and monitored through OBIX and nursing protocols followed.Reason for block:procedure for pain

## 2014-07-20 NOTE — Plan of Care (Signed)
Pt is on left lat side following epidural procedure. Deceleration noted as pt was turned to back for placement of foley catheter.  Pt returned to left side. SVE per RN. 3cm 60 -2. MD paged.  FHR returning to 160's following deceleration.  Pitocin turned off at 1422 per RN next contraction followed again with late deceleration to 110 bpm lasting 50 seconds.  Foley placed at 1424 per RN with pt on left tilt side position.  Initial 300 ml bolus given at beginning of deceleration with second bolus at 1424 of again 300 ml. Will report to Dr. Bonney AidStaebler.

## 2014-07-20 NOTE — Plan of Care (Signed)
Report to next shift RN staff 

## 2014-07-20 NOTE — Progress Notes (Signed)
Subjective:  Feeling comfortable since placement of epidural   Objective:   Filed Vitals:   07/20/14 1545 07/20/14 1555 07/20/14 1605 07/20/14 1722  BP: 108/62 96/56 101/56 101/48  Pulse: 87 76 77 67  Temp:    98 F (36.7 C)  TempSrc:    Oral  Resp:    24  Height:      Weight:       General:  Abdomen: Cervical Exam:  Dilation: 7 Effacement (%): 100 Cervical Position: Anterior Station: 0 Presentation: Vertex Exam by:: Dr. Chauncey CruelStabler 7/C/0   FHT: 125, moderate, +accels,decel to 90's lating 3mins return to baseline with good variability Toco: q765min couplets  Results for orders placed or performed during the hospital encounter of 07/20/14 (from the past 24 hour(s))  OB RESULT CONSOLE Group B Strep     Status: None   Collection Time: 07/20/14  8:20 AM  Result Value Ref Range   GBS Negative   CBC     Status: Abnormal   Collection Time: 07/20/14  8:45 AM  Result Value Ref Range   WBC 11.4 (H) 3.6 - 11.0 K/uL   RBC 4.24 3.80 - 5.20 MIL/uL   Hemoglobin 12.3 12.0 - 16.0 g/dL   HCT 40.937.8 81.135.0 - 91.447.0 %   MCV 89.0 80.0 - 100.0 fL   MCH 29.0 26.0 - 34.0 pg   MCHC 32.6 32.0 - 36.0 g/dL   RDW 78.215.4 (H) 95.611.5 - 21.314.5 %   Platelets 239 150 - 440 K/uL  Type and screen     Status: None   Collection Time: 07/20/14  8:47 AM  Result Value Ref Range   ABO/RH(D) A POS    Antibody Screen NEG    Sample Expiration 07/23/2014     Assessment:   23 y.o. G2P1001 1815w3d elective IOL   Plan:   1) Labor - continue active management.  FSE, IUPC in place pitocin off  2) Fetus - category II tracing, continue to monitor closely - fetal hydronephrosis follow up postpartum - 19lbs weight gain this pregnancy - Growth at 38 weeks 33289g (7lbs 4oz) c/w 53.4%ile - Pelvis tested to 7lbs 14oz (Vacuum assisted)    3) Substance use - THC positive previously this pregnancy, opiate positive 07/09/14 - send UDS this admission  4) TDAP given 05/28/14  5) Anxiety/Depression - needs follow up  postpartum  6) Disposition - pending delivery

## 2014-07-20 NOTE — Plan of Care (Signed)
Dr. Chauncey CruelStabler to room with decele. SVE 7 cm dilation, Zero station 100% effacement, reposition patient.

## 2014-07-20 NOTE — Progress Notes (Signed)
Subjective:  Feeling comfortable since placement of epidural   Objective:   Vitals: Blood pressure 101/54, pulse 78, temperature 98 F (36.7 C), temperature source Oral, resp. rate 20, height 5\' 1"  (1.549 m), weight 154 lb (69.854 kg), last menstrual period 10/17/2013. General:  Abdomen: Cervical Exam:  Dilation: 5 Effacement (%): 70 Station: -1 Presentation: Vertex Exam by:: Dr. Chauncey CruelStabler 5/80/-1   FHT: 125, moderate, +accels,decel to 60's lasting 4 mins return to baseline with good variability Toco: q845min  Results for orders placed or performed during the hospital encounter of 07/20/14 (from the past 24 hour(s))  OB RESULT CONSOLE Group B Strep     Status: None   Collection Time: 07/20/14  8:20 AM  Result Value Ref Range   GBS Negative   CBC     Status: Abnormal   Collection Time: 07/20/14  8:45 AM  Result Value Ref Range   WBC 11.4 (H) 3.6 - 11.0 K/uL   RBC 4.24 3.80 - 5.20 MIL/uL   Hemoglobin 12.3 12.0 - 16.0 g/dL   HCT 82.937.8 56.235.0 - 13.047.0 %   MCV 89.0 80.0 - 100.0 fL   MCH 29.0 26.0 - 34.0 pg   MCHC 32.6 32.0 - 36.0 g/dL   RDW 86.515.4 (H) 78.411.5 - 69.614.5 %   Platelets 239 150 - 440 K/uL  Type and screen     Status: None   Collection Time: 07/20/14  8:47 AM  Result Value Ref Range   ABO/RH(D) A POS    Antibody Screen NEG    Sample Expiration 07/23/2014     Assessment:   24 y.o. G2P1001 3267w3d elective IOL   Plan:   1) Labor - continue active management.  FSE, IUPC placed, pitocin off at present  2) Fetus - category II tracing, continue to monitor closely - fetal hydronephrosis follow up postpartum - 19lbs weight gain this pregnancy - Growth at 38 weeks 33289g (7lbs 4oz) c/w 53.4%ile - Pelvis tested to 7lbs 14oz (Vacuum assisted)    3) Substance use - THC positive previously this pregnancy, opiate positive 07/09/14 - send UDS this admission  4) TDAP given 05/28/14  5) Anxiety/Depression - needs follow up postpartum  6) Disposition - pending delivery

## 2014-07-20 NOTE — Anesthesia Preprocedure Evaluation (Addendum)
Anesthesia Evaluation  Patient identified by MRN, date of birth, ID band Patient awake    Reviewed: Allergy & Precautions, NPO status , Patient's Chart, lab work & pertinent test results  Airway Mallampati: I  TM Distance: >3 FB     Dental no notable dental hx.    Pulmonary neg pulmonary ROS, Current Smoker,  breath sounds clear to auscultation  Pulmonary exam normal       Cardiovascular hypertension, Normal cardiovascular exam    Neuro/Psych Seizures -,  PSYCHIATRIC DISORDERS    GI/Hepatic GERD-  Medicated and Controlled,  Endo/Other  negative endocrine ROS  Renal/GU The patient denies any renal issues.   negative genitourinary   Musculoskeletal   Abdominal   Peds  Hematology  (+) anemia ,   Anesthesia Other Findings   Reproductive/Obstetrics (+) Pregnancy                           Anesthesia Physical Anesthesia Plan  ASA: II  Anesthesia Plan: Epidural   Post-op Pain Management:    Induction:   Airway Management Planned:   Additional Equipment:   Intra-op Plan:   Post-operative Plan:   Informed Consent: I have reviewed the patients History and Physical, chart, labs and discussed the procedure including the risks, benefits and alternatives for the proposed anesthesia with the patient or authorized representative who has indicated his/her understanding and acceptance.     Plan Discussed with: Anesthesiologist  Anesthesia Plan Comments:        Anesthesia Quick Evaluation

## 2014-07-20 NOTE — Progress Notes (Signed)
Subjective:  Feeling comfortable since placement of epidural   Objective:   Vitals: Blood pressure 101/54, pulse 78, temperature 98 F (36.7 C), temperature source Oral, resp. rate 20, height 5\' 1"  (1.549 m), weight 154 lb (69.854 kg), last menstrual period 10/17/2013. General:  Abdomen: Cervical Exam:  Dilation: 4 Effacement (%): 50 Station: -2 Presentation: Vertex Exam by:: AMS 4/50/-2 AROM blood tinged fluid, FSE and IUPC placed  FHT: 125, moderate, +accels, recent decels to 90's with a series of 3 similar decels right after the epidural.  Good response to fetal scalp stim Toco: q2-553min  Results for orders placed or performed during the hospital encounter of 07/20/14 (from the past 24 hour(s))  OB RESULT CONSOLE Group B Strep     Status: None   Collection Time: 07/20/14  8:20 AM  Result Value Ref Range   GBS Negative   CBC     Status: Abnormal   Collection Time: 07/20/14  8:45 AM  Result Value Ref Range   WBC 11.4 (H) 3.6 - 11.0 K/uL   RBC 4.24 3.80 - 5.20 MIL/uL   Hemoglobin 12.3 12.0 - 16.0 g/dL   HCT 16.137.8 09.635.0 - 04.547.0 %   MCV 89.0 80.0 - 100.0 fL   MCH 29.0 26.0 - 34.0 pg   MCHC 32.6 32.0 - 36.0 g/dL   RDW 40.915.4 (H) 81.111.5 - 91.414.5 %   Platelets 239 150 - 440 K/uL  Type and screen     Status: None   Collection Time: 07/20/14  8:47 AM  Result Value Ref Range   ABO/RH(D) A POS    Antibody Screen NEG    Sample Expiration 07/23/2014     Assessment:   23 y.o. G2P1001 8652w3d elective IOL   Plan:   1) Labor - continue active management.  FSE, IUPC placed, pitocin off at present  2) Fetus - category II tracing, continue to monitor closely - fetal hydronephrosis follow up postpartum - 19lbs weight gain this pregnancy - Growth at 38 weeks 33289g (7lbs 4oz) c/w 53.4%ile - Pelvis tested to 7lbs 14oz (Vacuum assisted)    3) Substance use - THC positive previously this pregnancy, opiate positive 07/09/14 - send UDS this admission  4) TDAP given 05/28/14  5)  Anxiety/Depression - needs follow up postpartum  6) Disposition - pending delivery

## 2014-07-21 LAB — CBC
HCT: 32.4 % — ABNORMAL LOW (ref 35.0–47.0)
Hemoglobin: 10.6 g/dL — ABNORMAL LOW (ref 12.0–16.0)
MCH: 28.9 pg (ref 26.0–34.0)
MCHC: 32.8 g/dL (ref 32.0–36.0)
MCV: 88 fL (ref 80.0–100.0)
PLATELETS: 180 10*3/uL (ref 150–440)
RBC: 3.68 MIL/uL — ABNORMAL LOW (ref 3.80–5.20)
RDW: 15.4 % — AB (ref 11.5–14.5)
WBC: 12.2 10*3/uL — ABNORMAL HIGH (ref 3.6–11.0)

## 2014-07-21 LAB — RPR: RPR: NONREACTIVE

## 2014-07-21 MED ORDER — ALBUTEROL SULFATE (2.5 MG/3ML) 0.083% IN NEBU: 2.5000 mg | INHALATION_SOLUTION | RESPIRATORY_TRACT | Status: DC | PRN

## 2014-07-21 MED ORDER — ALBUTEROL SULFATE HFA 108 (90 BASE) MCG/ACT IN AERS: 2.0000 | INHALATION_SPRAY | RESPIRATORY_TRACT | Status: DC | PRN

## 2014-07-21 MED ORDER — PRENATAL MULTIVITAMIN CH
1.0000 | ORAL_TABLET | Freq: Every day | ORAL | Status: DC
  Administered 2014-07-22: 1 via ORAL

## 2014-07-21 NOTE — Anesthesia Postprocedure Evaluation (Signed)
  Anesthesia Post-op Note  Patient: Bailey Cabrera  Procedure(s) Performed: * No procedures listed *  Anesthesia type:Epidural  Patient location: mother baby unit  Post pain: Pain level controlled  Post assessment: Post-op Vital signs reviewed, Patient's Cardiovascular Status Stable, Respiratory Function Stable, Patent Airway and No signs of Nausea or vomiting  Post vital signs: Reviewed and stable  Last Vitals:  Filed Vitals:   07/21/14 1226  BP: 112/68  Pulse: 78  Temp: 36.7 C  Resp: 20    Level of consciousness: awake, alert  and patient cooperative  Complications: No apparent anesthesia complications

## 2014-07-21 NOTE — Progress Notes (Signed)
   Obstetric Postpartum Daily Progress Note Subjective:  24 y.o. [id number] status post vaginal delivery.  She is ambulating, is tolerating po, is voiding spontaneously.  Her pain is well controlled on PO pain medications. Her lochia is less than menses.   Medications SCHEDULED MEDICATIONS  . ibuprofen  600 mg Oral 4 times per day  . prenatal multivitamin  1 tablet Oral Q1200  . prenatal multivitamin  1 tablet Oral Q1200  . senna-docusate  2 tablet Oral Q24H    MEDICATION INFUSIONS   none  PRN MEDICATIONS  acetaminophen, albuterol, benzocaine-Menthol, diphenhydrAMINE, lanolin, ondansetron **OR** ondansetron (ZOFRAN) IV, oxyCODONE-acetaminophen, oxyCODONE-acetaminophen, simethicone    Objective:    Current Vital Signs 24h Vital Sign Ranges  T 98.1 F (36.7 C) Temp  Avg: 97.9 F (36.6 C)  Min: 97.8 F (36.6 C)  Max: 98.1 F (36.7 C)  BP 112/68 mmHg BP  Min: 84/58  Max: 136/68  HR 78 Pulse  Avg: 82.7  Min: 65  Max: 144  RR 20 Resp  Avg: 19.2  Min: 18  Max: 24  SaO2 99 % Simple Mask SpO2  Avg: 99 %  Min: 99 %  Max: 99 %       24 Hour I/O Current Shift I/O  Time Ins Outs 05/06 0701 - 05/07 0700 In: 762.6 [I.V.:762.6] Out: 1920 [Urine:1420]    General: NAD Pulmonary: no increased work of breathing Abdomen: non-distended, non-tender, fundus firm at level of umbilicus -1 Extremities: no edema, no erythema, no tenderness  Labs:   Recent Labs Lab 07/20/14 0845 07/21/14 0524  WBC 11.4* 12.2*  HGB 12.3 10.6*  HCT 37.8 32.4*  PLT 239 180    Assessment:   23 y.o. G2P2002 postpartum day # 1 s/p SVD   Plan:   1) Acute blood loss anemia - hemodynamically stable and asymptomatic - po ferrous sulfate  2) A POS (05/06 0847) / Rubella Immune / Varicella Immune  3) TDAP status - received during pregnancy  4) Breast feeding / Contraception is TBD  5) history of depression/anxiety/bipolar - 2 week follow up  6) Disposition - home tomorrow.  Conard NovakStephen D. Kyndal Gloster, MD,  FACOG 07/21/2014 12:44 PM

## 2014-07-21 NOTE — Discharge Instructions (Signed)

## 2014-07-21 NOTE — Anesthesia Post-op Follow-up Note (Signed)
  Anesthesia Pain Follow-up Note  Patient: Bailey Cabrera  Day #: 1  Date of Follow-up: 07/21/2014 Time: 9:01 AM  Last Vitals:  Filed Vitals:   07/21/14 0852  BP: 136/68  Pulse: 80  Temp: 36.6 C  Resp: 20    Level of Consciousness: alert  Pain: none   Side Effects:None  Catheter Site Exam:clean, dry, no drainage  Plan: D/C from anesthesia care  Basilio CairoHarvey,  Calina Patrie E

## 2014-07-22 LAB — ABO/RH: ABO/RH(D): A POS

## 2014-07-22 MED ORDER — OXYCODONE-ACETAMINOPHEN 5-325 MG PO TABS: 1.0000 | ORAL_TABLET | Freq: Four times a day (QID) | ORAL | Status: DC | PRN

## 2014-07-22 MED ORDER — IBUPROFEN 600 MG PO TABS: 600.0000 mg | ORAL_TABLET | Freq: Four times a day (QID) | ORAL | Status: DC

## 2014-07-22 NOTE — Discharge Summary (Signed)
Bailey Cabrera    MRN: [id number]   Date of admission: 07/20/2014   Obstetric Discharge Summary Reason for Admission: induction of labor for advanced cervical dilation. Prenatal Procedures: none Intrapartum Procedures: spontaneous vaginal delivery Postpartum Procedures: none Complications-Operative and Postpartum: none  Recent Labs     07/20/14  0845  07/21/14  0524  HCT  37.8  32.4*     Physical Exam:  General: alert and no distress Lochia: appropriate Uterine Fundus: firm at umb Incision: none DVT Evaluation: No evidence of DVT seen on physical exam.  Discharge Diagnoses: Term Pregnancy-delivered  Discharge Information: Date: 07/22/2014 Activity: pelvic rest Diet: routine Medications: Ibuprofen and Percocet Condition: stable Instructions: no driving while taking narcotic pain medication, pelvic rest for six weeks Discharge to: home Follow-up Information    Follow up with Lorrene ReidSTAEBLER, ANDREAS M, MD. Call in 2 weeks.   Specialty:  Obstetrics and Gynecology   Why:  call Monday for an appointment in 2 weeks to follow up for screening for anxiety/depression. Also, schedule six week postpartum appointment.   Contact information:   [address] [place] [id number] [phone]       Newborn Data: Live born female  Birth Weight: 6 lb 15.8 oz (3170 g) APGAR: 9, 9  Conard NovakStephen D. Jackson, MD, [facility] 07/22/2014 2:46 PM

## 2014-07-22 NOTE — Progress Notes (Signed)
Patient discharged home with infant. Vital signs stable, bleeding within normal limits, uterus firm. Discharge instructions, prescriptions, and follow up appointment given to and reviewed with patient. Patient verbalized understanding, all questions answered. Escorted in wheelchair by auxiliary.    

## 2014-07-24 NOTE — H&P (Signed)
L&D Evaluation:  History:  HPI 24yo G2P1001 @ 30.3 by LMP c/w 1st trimester US with EDC of 07/24/14 presents to triage with complaints of nausea/vomiting and groin pain that began around 4pm today after eating [employer].  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   Presents with nausea/vomiting   Patient's Medical History as above   Patient's Surgical History none   Medications Pre Natal Vitamins   Allergies PCN   Social History drugs  +marijuana   Family History Non-Contributory   ROS:  ROS All systems were reviewed.  HEENT, CNS, GI, GU, Respiratory, CV, Renal and Musculoskeletal systems were found to be normal., except as listed in HPI   Exam:  Vital Signs stable   Urine Protein negative dipstick   General no apparent distress   Mental Status clear   Chest clear   Heart normal sinus rhythm   Abdomen gravid, non-tender   Estimated Fetal Weight Average for gestational age   Back no CVAT   Edema no edema   Mebranes Intact   FHT normal rate with no decels, 130 mod +accels no decels   Impression:  Impression dehydration, reactive NST   Plan:  Comments 24yo G2P1001 @ 30.3 with GI upset, dehydration  1. IVF bolus LR 2. phenergan 25 IV q6hrs 3. send cbc and cmp to eval for WBC and electrolyte abnromalities 4. UA reflex UCX   Electronic Signatures: Venora Kautzman, Elenora Fenderhelsea C (MD)  (Signed [id number]-Mar-16 06:28)  Authored: L&D Evaluation   Last Updated: 04-Mar-16 06:28 by Yates Weisgerber, Elenora Fenderhelsea C (MD)

## 2014-07-24 NOTE — H&P (Signed)
L&D Evaluation:  History Expanded:  HPI 23yo G2P1001 with EDD of 5/10/]16 by LMP and 1st trimester US, presents at 34 wks with c/o by LMP c/w 1st trimester US with EDC of 07/24/14 presents with c/o nausea and vomiting. Pt was seen last night in triage for same, able to tolerate some po fluids and d/c home. 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   Blood Type (Maternal) A positive   Group B Strep Results Maternal (Result >5wks must be treated as unknown) unknown/result > 5 weeks ago   Maternal HIV Negative   Maternal Syphilis Ab Nonreactive   Maternal Varicella Immune   Rubella Results (Maternal) immune   Presents with nausea/vomiting   Patient's Medical History seizures during pregnancy   Patient's Surgical History none   Medications Pre Natal Vitamins  albuterol, zofran, valtrex, antacid (?protonix)   Allergies PCN   Social History drugs  +marijuana   Family History Non-Contributory   ROS:  ROS All systems were reviewed.  HEENT, CNS, GI, GU, Respiratory, CV, Renal and Musculoskeletal systems were found to be normal., except as listed in HPI   Exam:  Vital Signs stable   Urine Protein negative dipstick   General no apparent distress   Mental Status clear   Abdomen gravid, non-tender   Estimated Fetal Weight Average for gestational age   Back no CVAT   Edema no edema   Mebranes Intact   FHT normal rate with no decels, 130 mod +accels no decels   Other UA: 1+ ketones   Impression:  Impression reactive NST, IUP at 34 wks, NVP   Plan:  Comments IV fluid bolus  given. Pt able to rest after this and reports feeling much better. Po trial - will d/c home if tolerated Refill Rx'd for Zofran   Electronic Signatures: Vella KohlerBrothers, Auden Wettstein K (CNM)  (Signed 28-Mar-16 15:31)  Authored: L&D Evaluation   Last Updated: 28-Mar-16 15:31 by Vella KohlerBrothers, Rosalyn Archambault K (CNM)

## 2014-07-24 NOTE — H&P (Signed)
L&D Evaluation:  History:  HPI 24yo G2P1001 with EDD of 5/10/]16 by LMP and 1st trimester US, presents at 37.6 weeks presents with c/o green discharge and feeling uncomfortable "down there".  Feeling badly today, reports seeing black spots in vision, headache relieved with tylenol, nausea and vomiting. Having some contractions and noticed a blood tinged mucoid discharge. Vaginal area sore. BP 120/80 at office visit today.  +FM-but less than usual today. Last kept down solids last night"could not eat enough". Has been able to keep down Gatorade and water today.   pregnancy issues: recurrent nausea & vomiting and heartburn. Takes Zofran and Protonix (last took meds yesterday). TWG 17# history of anxiety/depression/bipolar - sees therapist at [facility], no meds history of pre-eclampsia in previous pregnancy with mag toxicity MJ use in this pregnancy-last UDS negative. Currently taking occasional 7.5mg m Vicodin for tooth pain. 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 ?vasovagal episodes. Has not made neuro appts.   Presents with nausea/vomiting, green discharge and soreness/discomfort in vaginal area   Patient's Medical History HSV II, "seizures in pregnancy", anxiety/depression/bipolar   Patient's Surgical History none   Medications Pre Natal Vitamins  albuterol, zofran, valtrex, antacid (?protonix)) and Vicodin 7.5 mgm   Allergies PCN   Social History drugs  +marijuana   Family History Non-Contributory   ROS:  ROS see HPI   Exam:  Vital Signs initial BP 132/102, then 123/74, 114/81,117/75, 109/69, 111/75, 110/70,100/52,   Urine Protein P/C ratio=266mg m   General no apparent distress   Mental Status clear   Chest clear   Heart normal sinus rhythm, no murmur/gallop/rubs   Abdomen gravid, non-tender   Fetal Position vtx on US   Edema no  edema   Reflexes 1+   Pelvic no external lesions, introitus appears hyperemic. Spec exam: white mucoepithelial discharge. Wet prep negative for yeast, clue cells or Trich. CX: tight 3/60%/-1 and very posterior   Mebranes Intact   FHT 140s with accels to 160s to 170s. One questionable mild decel to 120 after accel. Mod variability   Ucx irregular, inconsistent strenth and duration. 2ctxs seen in the last 40 minutes.   Skin dry   Other LABS: BUN/cr=10/0.56, plt=260, SGOT=23, NA+ 134, K+ 3.3 AFI= 4.1+ 4.8+ 2.8+2.4=14.1cm. Left fetal hydronephrosis 24mm with distended fetal bladder   Impression:  Impression IUP at 38 weeks with no evidence of preeclampsia.   Plan:  Comments Will have patient rtn to office in 2 days for BP check. Contractions have spaced out with po hydration. No further N/V while being observed Fetal hydronephrosis-advise peds after delivery. Will see if can get formal ultrasound for growth and to FU  Labor precautions Discussed case and POM with Dr Gomez CleverlyPickins   Electronic Signatures: Trinna BalloonGutierrez, Ahnaf Caponi L (CNM)  (Signed 26-Apr-16 02:37)  Authored: L&D Evaluation   Last Updated: 26-Apr-16 02:37 by Trinna BalloonGutierrez, Delvin Hedeen L (CNM)

## 2014-11-27 ENCOUNTER — Ambulatory Visit
Admission: EM | Admit: 2014-11-27 | Discharge: 2014-11-27 | Disposition: A | Discharge status: Home / Self Care | Payer: Medicaid Other | Attending: Family Medicine | Admitting: Family Medicine

## 2014-11-27 DIAGNOSIS — H6501 Acute serous otitis media, right ear: Secondary | ICD-10-CM | POA: Diagnosis not present

## 2014-11-27 DIAGNOSIS — J4520 Mild intermittent asthma, uncomplicated: Secondary | ICD-10-CM

## 2014-11-27 DIAGNOSIS — J069 Acute upper respiratory infection, unspecified: Secondary | ICD-10-CM | POA: Diagnosis not present

## 2014-11-27 HISTORY — DX: Unspecified asthma, uncomplicated: J45.909

## 2014-11-27 HISTORY — DX: Hypotension, unspecified: I95.9

## 2014-11-27 MED ORDER — ALBUTEROL SULFATE HFA 108 (90 BASE) MCG/ACT IN AERS: 2.0000 | INHALATION_SPRAY | RESPIRATORY_TRACT | Status: DC | PRN

## 2014-11-27 MED ORDER — AZITHROMYCIN 250 MG PO TABS: ORAL_TABLET | ORAL | Status: DC

## 2014-11-27 NOTE — ED Notes (Signed)
Pt states "my blood pressure is always low, that is normal for me."

## 2014-11-27 NOTE — ED Notes (Signed)
Pt states "I have been sick with a cold since Friday. Coughing up green, yellow stuff. No fevers. Severe nasal congestion."

## 2014-11-27 NOTE — ED Provider Notes (Signed)
CSN: [id number]     Arrival date & time 11/27/14  1052 History   First MD Initiated Contact with Patient 11/27/14 1148     Chief Complaint  Patient presents with  . URI   (Consider location/radiation/quality/duration/timing/severity/associated sxs/prior Treatment) HPI Comments: 24 yo female, smoker, with a h/o mild, intermittent asthma, presents with a 4 days h/o productive cough, increased wheezing, nasal congestion, runny nose, pressure in ears, sore throat. Denies any fevers, chills, chest pains, vomiting, diarrhea, rash. States had run out of her albuterol inhaler. States smokes half a pack per day.   The history is provided by the patient.    Past Medical History  Diagnosis Date  . Anemia   . Seizures     During pregnancy; most recent April 2016  . 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 Hypotension   . Asthma    Past Surgical History  Procedure Laterality Date  . Wisdom tooth extraction Bilateral 2011    Post op infection   No family history on file. Social History  Substance Use Topics  . Smoking status: Current Every Day Smoker -- 1.00 packs/day for 5 years    Types: Cigarettes  . Smokeless tobacco: Never Used  . Alcohol Use: No   OB History    Gravida Para Term Preterm AB TAB SAB Ectopic Multiple Living   2 2 2       0 2     Review of Systems  Allergies  Penicillins  Home Medications   Prior to Admission medications   Medication Sig Start Date End Date Taking? Authorizing Provider  valACYclovir (VALTREX) 500 MG tablet Take 500 mg by mouth 2 (two) times daily.   Yes Historical Provider, MD  albuterol (PROVENTIL HFA;VENTOLIN HFA) 108 (90 BASE) MCG/ACT inhaler Inhale 2 puffs into the lungs every 4 (four) hours as needed for wheezing or shortness of breath. 11/27/14   Payton Mccallum, MD  azithromycin (ZITHROMAX Z-PAK) 250 MG tablet 2 tabs po once day 1, then 1 tab po qd for next 4 days 11/27/14   Payton Mccallum, MD  ibuprofen  (ADVIL,MOTRIN) 600 MG tablet Take 1 tablet (600 mg total) by mouth every 6 (six) hours. 07/22/14   Conard Novak, MD  oxyCODONE-acetaminophen (PERCOCET/ROXICET) 5-325 MG per tablet Take 1 tablet by mouth every 6 (six) hours as needed (for pain scale greater than 7). 07/22/14   Conard Novak, MD  Prenatal Vit-Fe Fumarate-FA (MULTIVITAMIN-PRENATAL) 27-0.8 MG TABS tablet Take 1 tablet by mouth daily at 12 noon.    Historical Provider, MD   Meds Ordered and Administered this Visit  Medications - No data to display  BP 88/69 mmHg  Pulse 90  Temp(Src) 97.8 F (36.6 C) (Oral)  Resp 16  Ht 5\' 1"  (1.549 m)  Wt 138 lb (62.596 kg)  BMI 26.09 kg/m2  SpO2 100%  LMP 11/20/2014 No data found.   Physical Exam  Constitutional: She appears well-developed and well-nourished. No distress.  HENT:  Head: Normocephalic and atraumatic.  Right Ear: External ear and ear canal normal. Tympanic membrane is injected, erythematous and bulging. A middle ear effusion is present.  Left Ear: Tympanic membrane, external ear and ear canal normal.  Nose: Mucosal edema and rhinorrhea present. No nose lacerations, sinus tenderness, nasal deformity, septal deviation or nasal septal hematoma. No epistaxis.  No foreign bodies.  Mouth/Throat: Uvula is midline and mucous membranes are normal. Posterior oropharyngeal erythema present. No oropharyngeal exudate, posterior oropharyngeal  edema or tonsillar abscesses.  Eyes: Conjunctivae and EOM are normal. Pupils are equal, round, and reactive to light. Right eye exhibits no discharge. Left eye exhibits no discharge. No scleral icterus.  Neck: Normal range of motion. Neck supple. No thyromegaly present.  Cardiovascular: Normal rate, regular rhythm and normal heart sounds.   Pulmonary/Chest: Effort normal. No respiratory distress. She has wheezes (few mild diffuse expiratory wheezes). She has no rales.  Lymphadenopathy:    She has no cervical adenopathy.  Skin: No rash noted.  She is not diaphoretic.  Nursing note and vitals reviewed.   ED Course  Procedures (including critical care time)  Labs Review Labs Reviewed - No data to display  Imaging Review No results found.   Visual Acuity Review  Right Eye Distance:   Left Eye Distance:   Bilateral Distance:    Right Eye Near:   Left Eye Near:    Bilateral Near:         MDM   1. Right acute serous otitis media, recurrence not specified   2. URI, acute   3. Asthma, mild intermittent, uncomplicated    New Prescriptions   ALBUTEROL (PROVENTIL HFA;VENTOLIN HFA) 108 (90 BASE) MCG/ACT INHALER    Inhale 2 puffs into the lungs every 4 (four) hours as needed for wheezing or shortness of breath.   AZITHROMYCIN (ZITHROMAX Z-PAK) 250 MG TABLET    2 tabs po once day 1, then 1 tab po qd for next 4 days  Plan: 1. diagnosis reviewed with patient 2. rx as per orders; risks, benefits, potential side effects reviewed with patient 3. Recommend supportive treatment with increased fluids, otc analgesics prn 4. Recommend smoking cessation 5.  F/u prn if symptoms worsen or don't improve    Payton Mccallum, MD 11/27/14 1222

## 2015-03-17 NOTE — L&D Delivery Note (Signed)
Operative Delivery Note At 9:12 AM a viable female was delivered via Vaginal, Spontaneous Delivery.  Presentation: vertex; Position: Right,, Occiput,, Anterior; Station: +5.  Delivery of the head: [place]   APGAR: 8, 9; weight  pending.   Placenta status: spontaneous, intact.   Cord:  3VC loose nuchal x 1with the following complications: mild shoulder.  Cord pH: N/A  Anesthesia: Spinal/Epidural Episiotomy:  none Lacerations:  none Suture Repair: N/A Est. Blood Loss (mL):  300mL  Mom to postpartum.  Baby to Couplet care / Skin to Skin.  Krithi Bray M 02/28/2016, 9:27 AM

## 2015-06-19 ENCOUNTER — Encounter: Payer: Self-pay | Admitting: Emergency Medicine

## 2015-06-19 ENCOUNTER — Emergency Department
Admission: EM | Admit: 2015-06-19 | Discharge: 2015-06-19 | Disposition: A | Discharge status: Home / Self Care | Payer: Medicaid Other | Attending: Emergency Medicine | Admitting: Emergency Medicine

## 2015-06-19 DIAGNOSIS — Y929 Unspecified place or not applicable: Secondary | ICD-10-CM | POA: Insufficient documentation

## 2015-06-19 DIAGNOSIS — M545 Low back pain: Secondary | ICD-10-CM | POA: Diagnosis present

## 2015-06-19 DIAGNOSIS — Y93F2 Activity, caregiving, lifting: Secondary | ICD-10-CM | POA: Insufficient documentation

## 2015-06-19 DIAGNOSIS — X500XXA Overexertion from strenuous movement or load, initial encounter: Secondary | ICD-10-CM | POA: Diagnosis not present

## 2015-06-19 DIAGNOSIS — S39012A Strain of muscle, fascia and tendon of lower back, initial encounter: Secondary | ICD-10-CM

## 2015-06-19 DIAGNOSIS — Y999 Unspecified external cause status: Secondary | ICD-10-CM | POA: Diagnosis not present

## 2015-06-19 DIAGNOSIS — Z79899 Other long term (current) drug therapy: Secondary | ICD-10-CM | POA: Insufficient documentation

## 2015-06-19 DIAGNOSIS — F17210 Nicotine dependence, cigarettes, uncomplicated: Secondary | ICD-10-CM | POA: Diagnosis not present

## 2015-06-19 DIAGNOSIS — G40909 Epilepsy, unspecified, not intractable, without status epilepticus: Secondary | ICD-10-CM | POA: Insufficient documentation

## 2015-06-19 DIAGNOSIS — N3000 Acute cystitis without hematuria: Secondary | ICD-10-CM | POA: Insufficient documentation

## 2015-06-19 DIAGNOSIS — J45909 Unspecified asthma, uncomplicated: Secondary | ICD-10-CM | POA: Insufficient documentation

## 2015-06-19 LAB — URINALYSIS COMPLETE WITH MICROSCOPIC (ARMC ONLY)
Bilirubin Urine: NEGATIVE
Glucose, UA: NEGATIVE mg/dL
HGB URINE DIPSTICK: NEGATIVE
Ketones, ur: NEGATIVE mg/dL
Nitrite: NEGATIVE
PH: 6 (ref 5.0–8.0)
PROTEIN: NEGATIVE mg/dL
SPECIFIC GRAVITY, URINE: 1.025 (ref 1.005–1.030)

## 2015-06-19 MED ORDER — DIAZEPAM 5 MG/ML IJ SOLN
5.0000 mg | Freq: Once | INTRAMUSCULAR | Status: AC
  Administered 2015-06-19: 5 mg via INTRAMUSCULAR
  Filled 2015-06-19: qty 2

## 2015-06-19 MED ORDER — SULFAMETHOXAZOLE-TRIMETHOPRIM 800-160 MG PO TABS: 1.0000 | ORAL_TABLET | Freq: Two times a day (BID) | ORAL | Status: DC

## 2015-06-19 MED ORDER — KETOROLAC TROMETHAMINE 30 MG/ML IJ SOLN
30.0000 mg | Freq: Once | INTRAMUSCULAR | Status: AC
  Administered 2015-06-19: 30 mg via INTRAMUSCULAR
  Filled 2015-06-19: qty 1

## 2015-06-19 MED ORDER — OXYCODONE-ACETAMINOPHEN 5-325 MG PO TABS
2.0000 | ORAL_TABLET | Freq: Once | ORAL | Status: AC
  Administered 2015-06-19: 2 via ORAL

## 2015-06-19 MED ORDER — OXYCODONE-ACETAMINOPHEN 5-325 MG PO TABS
ORAL_TABLET | ORAL | Status: AC
  Filled 2015-06-19: qty 2

## 2015-06-19 MED ORDER — IBUPROFEN 800 MG PO TABS: 800.0000 mg | ORAL_TABLET | Freq: Three times a day (TID) | ORAL | Status: DC | PRN

## 2015-06-19 MED ORDER — OXYCODONE-ACETAMINOPHEN 5-325 MG PO TABS: 1.0000 | ORAL_TABLET | ORAL | Status: DC | PRN

## 2015-06-19 MED ORDER — CYCLOBENZAPRINE HCL 10 MG PO TABS: 10.0000 mg | ORAL_TABLET | Freq: Three times a day (TID) | ORAL | Status: DC | PRN

## 2015-06-19 NOTE — ED Notes (Signed)
Pt to ed with c/o right side back pain,  Pt denies injury.  Pt states any movement or activity makes pain worse.

## 2015-06-19 NOTE — ED Provider Notes (Signed)
[facility] Emergency Department Provider Note  ____________________________________________  Time seen: Approximately 2:55 PM  I have reviewed the triage vital signs and the nursing notes.   HISTORY  Chief Complaint Back Pain    HPI Bailey Cabrera is a 25 y.o. female presents for evaluation of back pain which started yesterday. Patient thinks that she pulled a muscle in her back lifting her daughter. Denies any dysuria or painful urination. States no relief with Tylenol or ibuprofen over-the-counter. Describes pain as 8/10 nonradiating.   Past Medical History  Diagnosis Date  . Anemia   . Seizures (HCC)     During pregnancy; most recent April 2016  . 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 Hypotension   . Asthma     Patient Active Problem List   Diagnosis Date Noted  . Labor and delivery indication for care or intervention 07/20/2014  . Marijuana use 07/20/2014  . 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 Anxiety 07/20/2014  . Depression 07/20/2014  . Bipolar 1 disorder (HCC) 07/20/2014  . Fetal hydronephrosis during pregnancy, antepartum 07/20/2014  . Labor and delivery, indication for care 07/20/2014    Past Surgical History  Procedure Laterality Date  . Wisdom tooth extraction Bilateral 2011    Post op infection    Current Outpatient Rx  Name  Route  Sig  Dispense  Refill  . cetirizine (ZYRTEC) 10 MG tablet   Oral   Take 10 mg by mouth daily.         . cyclobenzaprine (FLEXERIL) 10 MG tablet   Oral   Take 1 tablet (10 mg total) by mouth every 8 (eight) hours as needed for muscle spasms.   30 tablet   1   . ibuprofen (ADVIL,MOTRIN) 800 MG tablet   Oral   Take 1 tablet (800 mg total) by mouth every 8 (eight) hours as needed.   30 tablet   0   . oxyCODONE-acetaminophen (ROXICET) 5-325 MG tablet   Oral   Take 1-2 tablets by mouth every 4 (four) hours as needed for severe pain.   15 tablet    0   . Prenatal Vit-Fe Fumarate-FA (MULTIVITAMIN-PRENATAL) 27-0.8 MG TABS tablet   Oral   Take 1 tablet by mouth daily at 12 noon.         . sulfamethoxazole-trimethoprim (BACTRIM DS,SEPTRA DS) 800-160 MG tablet   Oral   Take 1 tablet by mouth 2 (two) times daily.   14 tablet   0   . valACYclovir (VALTREX) 500 MG tablet   Oral   Take 500 mg by mouth 2 (two) times daily.           Allergies Penicillins  History reviewed. No pertinent family history.  Social History Social History  Substance Use Topics  . Smoking status: Current Every Day Smoker -- 1.00 packs/day for 5 years    Types: Cigarettes  . Smokeless tobacco: Never Used  . Alcohol Use: No    Review of Systems Constitutional: No fever/chills Cardiovascular: Denies chest pain. Respiratory: Denies shortness of breath. Gastrointestinal: No abdominal pain.  No nausea, no vomiting.  No diarrhea.  No constipation. Genitourinary: Negative for dysuria. Musculoskeletal: Positive for low back pain. Skin: Negative for rash.  10-point ROS otherwise negative.  ____________________________________________   PHYSICAL EXAM:  VITAL SIGNS: ED Triage Vitals  Enc Vitals Group     BP 06/19/15 1418 115/69 mmHg     Pulse Rate 06/19/15 1418 96  Resp 06/19/15 1418 18     Temp 06/19/15 1418 97.7 F (36.5 C)     Temp Source 06/19/15 1418 Oral     SpO2 06/19/15 1418 100 %     Weight 06/19/15 1418 157 lb (71.215 kg)     Height 06/19/15 1418 5\' 1"  (1.549 m)     Head Cir --      Peak Flow --      Pain Score 06/19/15 1419 8     Pain Loc --      Pain Edu? --      Excl. in GC? --     Constitutional: Alert and oriented. Well appearing and in no acute distress.   Cardiovascular: Normal rate, regular rhythm. Grossly normal heart sounds.  Good peripheral circulation. Respiratory: Normal respiratory effort.  No retractions. Lungs CTAB. Gastrointestinal: Soft and nontender. No distention. No abdominal bruits. No CVA  tenderness. Negative suprapubic tenderness. Musculoskeletal: Positive right flank pain. Neurologic:  Normal speech and language. No gross focal neurologic deficits are appreciated. No gait instability. Skin:  Skin is warm, dry and intact. No rash noted. Psychiatric: Mood and affect are normal. Speech and behavior are normal.  ____________________________________________   LABS (all labs ordered are listed, but only abnormal results are displayed)  Labs Reviewed  URINALYSIS COMPLETEWITH MICROSCOPIC ([facility] ONLY) - Abnormal; Notable for the following:    Color, Urine YELLOW (*)    APPearance CLOUDY (*)    Leukocytes, UA 3+ (*)    Bacteria, UA RARE (*)    Squamous Epithelial / LPF 0-5 (*)    All other components within normal limits    RADIOLOGY  Deferred at this time. ____________________________________________   PROCEDURES  Procedure(s) performed: None  Critical Care performed: No  ____________________________________________   INITIAL IMPRESSION / ASSESSMENT AND PLAN / ED COURSE  Pertinent labs & imaging results that were available during my care of the patient were reviewed by me and considered in my medical decision making (see chart for details).  Acute lumbar sacral strain, UTI. Rx given for Bactrim DS twice a day, except 5/325, I'm 100 mg and Flexeril 10 mg. Patient to follow up PCP or return to ER with any worsening symptomology. Patient reports minimal relief with Toradol and Valium IM given while in the ED. ____________________________________________   FINAL CLINICAL IMPRESSION(S) / ED DIAGNOSES  Final diagnoses:  Lumbar strain, initial encounter  Acute cystitis without hematuria     This chart was dictated using voice recognition software/Dragon. Despite best efforts to proofread, errors can occur which can change the meaning. Any change was purely unintentional.   Evangeline Dakinharles M Beers, PA-C 06/19/15 1541  Evangeline Dakinharles M Beers, PA-C 06/19/15 [phone]

## 2015-06-19 NOTE — Discharge Instructions (Signed)
Lumbosacral Strain Lumbosacral strain is a strain of any of the parts that make up your lumbosacral vertebrae. Your lumbosacral vertebrae are the bones that make up the lower third of your backbone. Your lumbosacral vertebrae are held together by muscles and tough, fibrous tissue (ligaments).  CAUSES  A sudden blow to your back can cause lumbosacral strain. Also, anything that causes an excessive stretch of the muscles in the low back can cause this strain. This is typically seen when people exert themselves strenuously, fall, lift heavy objects, bend, or crouch repeatedly. RISK FACTORS  Physically demanding work.  Participation in pushing or pulling sports or sports that require a sudden twist of the back (tennis, golf, baseball).  Weight lifting.  Excessive lower back curvature.  Forward-tilted pelvis.  Weak back or abdominal muscles or both.  Tight hamstrings. SIGNS AND SYMPTOMS  Lumbosacral strain may cause pain in the area of your injury or pain that moves (radiates) down your leg.  DIAGNOSIS Your health care provider can often diagnose lumbosacral strain through a physical exam. In some cases, you may need tests such as X-ray exams.  TREATMENT  Treatment for your lower back injury depends on many factors that your clinician will have to evaluate. However, most treatment will include the use of anti-inflammatory medicines. HOME CARE INSTRUCTIONS   Avoid hard physical activities (tennis, racquetball, waterskiing) if you are not in proper physical condition for it. This may aggravate or create problems.  If you have a back problem, avoid sports requiring sudden body movements. Swimming and walking are generally safer activities.  Maintain good posture.  Maintain a healthy weight.  For acute conditions, you may put ice on the injured area.  Put ice in a plastic bag.  Place a towel between your skin and the bag.  Leave the ice on for 20 minutes, 2-3 times a day.  When the  low back starts healing, stretching and strengthening exercises may be recommended. SEEK MEDICAL CARE IF:  Your back pain is getting worse.  You experience severe back pain not relieved with medicines. SEEK IMMEDIATE MEDICAL CARE IF:   You have numbness, tingling, weakness, or problems with the use of your arms or legs.  There is a change in bowel or bladder control.  You have increasing pain in any area of the body, including your belly (abdomen).  You notice shortness of breath, dizziness, or feel faint.  You feel sick to your stomach (nauseous), are throwing up (vomiting), or become sweaty.  You notice discoloration of your toes or legs, or your feet get very cold. MAKE SURE YOU:   Understand these instructions.  Will watch your condition.  Will get help right away if you are not doing well or get worse.   This information is not intended to replace advice given to you by your health care provider. Make sure you discuss any questions you have with your health care provider.   Document Released: 12/10/2004 Document Revised: 03/23/2014 Document Reviewed: 10/19/2012 Elsevier Interactive Patient Education 2016 Elsevier Inc.  Urinary Tract Infection Urinary tract infections (UTIs) can develop anywhere along your urinary tract. Your urinary tract is your body's drainage system for removing wastes and extra water. Your urinary tract includes two kidneys, two ureters, a bladder, and a urethra. Your kidneys are a pair of bean-shaped organs. Each kidney is about the size of your fist. They are located below your ribs, one on each side of your spine. CAUSES Infections are caused by microbes, which are microscopic  organisms, including fungi, viruses, and bacteria. These organisms are so small that they can only be seen through a microscope. Bacteria are the microbes that most commonly cause UTIs. SYMPTOMS  Symptoms of UTIs may vary by age and gender of the patient and by the location of  the infection. Symptoms in young women typically include a frequent and intense urge to urinate and a painful, burning feeling in the bladder or urethra during urination. Older women and men are more likely to be tired, shaky, and weak and have muscle aches and abdominal pain. A fever may mean the infection is in your kidneys. Other symptoms of a kidney infection include pain in your back or sides below the ribs, nausea, and vomiting. DIAGNOSIS To diagnose a UTI, your caregiver will ask you about your symptoms. Your caregiver will also ask you to provide a urine sample. The urine sample will be tested for bacteria and white blood cells. White blood cells are made by your body to help fight infection. TREATMENT  Typically, UTIs can be treated with medication. Because most UTIs are caused by a bacterial infection, they usually can be treated with the use of antibiotics. The choice of antibiotic and length of treatment depend on your symptoms and the type of bacteria causing your infection. HOME CARE INSTRUCTIONS  If you were prescribed antibiotics, take them exactly as your caregiver instructs you. Finish the medication even if you feel better after you have only taken some of the medication.  Drink enough water and fluids to keep your urine clear or pale yellow.  Avoid caffeine, tea, and carbonated beverages. They tend to irritate your bladder.  Empty your bladder often. Avoid holding urine for long periods of time.  Empty your bladder before and after sexual intercourse.  After a bowel movement, women should cleanse from front to back. Use each tissue only once. SEEK MEDICAL CARE IF:   You have back pain.  You develop a fever.  Your symptoms do not begin to resolve within 3 days. SEEK IMMEDIATE MEDICAL CARE IF:   You have severe back pain or lower abdominal pain.  You develop chills.  You have nausea or vomiting.  You have continued burning or discomfort with urination. MAKE SURE  YOU:   Understand these instructions.  Will watch your condition.  Will get help right away if you are not doing well or get worse.   This information is not intended to replace advice given to you by your health care provider. Make sure you discuss any questions you have with your health care provider.   Document Released: 12/10/2004 Document Revised: 11/21/2014 Document Reviewed: 04/10/2011 Elsevier Interactive Patient Education 2016 Elsevier Inc.  Urinary Tract Infection Urinary tract infections (UTIs) can develop anywhere along your urinary tract. Your urinary tract is your body's drainage system for removing wastes and extra water. Your urinary tract includes two kidneys, two ureters, a bladder, and a urethra. Your kidneys are a pair of bean-shaped organs. Each kidney is about the size of your fist. They are located below your ribs, one on each side of your spine. CAUSES Infections are caused by microbes, which are microscopic organisms, including fungi, viruses, and bacteria. These organisms are so small that they can only be seen through a microscope. Bacteria are the microbes that most commonly cause UTIs. SYMPTOMS  Symptoms of UTIs may vary by age and gender of the patient and by the location of the infection. Symptoms in young women typically include a frequent and  intense urge to urinate and a painful, burning feeling in the bladder or urethra during urination. Older women and men are more likely to be tired, shaky, and weak and have muscle aches and abdominal pain. A fever may mean the infection is in your kidneys. Other symptoms of a kidney infection include pain in your back or sides below the ribs, nausea, and vomiting. DIAGNOSIS To diagnose a UTI, your caregiver will ask you about your symptoms. Your caregiver will also ask you to provide a urine sample. The urine sample will be tested for bacteria and white blood cells. White blood cells are made by your body to help fight  infection. TREATMENT  Typically, UTIs can be treated with medication. Because most UTIs are caused by a bacterial infection, they usually can be treated with the use of antibiotics. The choice of antibiotic and length of treatment depend on your symptoms and the type of bacteria causing your infection. HOME CARE INSTRUCTIONS  If you were prescribed antibiotics, take them exactly as your caregiver instructs you. Finish the medication even if you feel better after you have only taken some of the medication.  Drink enough water and fluids to keep your urine clear or pale yellow.  Avoid caffeine, tea, and carbonated beverages. They tend to irritate your bladder.  Empty your bladder often. Avoid holding urine for long periods of time.  Empty your bladder before and after sexual intercourse.  After a bowel movement, women should cleanse from front to back. Use each tissue only once. SEEK MEDICAL CARE IF:   You have back pain.  You develop a fever.  Your symptoms do not begin to resolve within 3 days. SEEK IMMEDIATE MEDICAL CARE IF:   You have severe back pain or lower abdominal pain.  You develop chills.  You have nausea or vomiting.  You have continued burning or discomfort with urination. MAKE SURE YOU:   Understand these instructions.  Will watch your condition.  Will get help right away if you are not doing well or get worse.   This information is not intended to replace advice given to you by your health care provider. Make sure you discuss any questions you have with your health care provider.   Document Released: 12/10/2004 Document Revised: 11/21/2014 Document Reviewed: 04/10/2011 Elsevier Interactive Patient Education [employer]2016 Elsevier Inc.

## 2015-06-19 NOTE — ED Notes (Signed)
States she developed right mid back pain which is non radiating yesterday.  Denies any n/v/d or fever   No trauma or urinary sx's

## 2015-08-23 ENCOUNTER — Observation Stay
Admission: AD | Admit: 2015-08-23 | Discharge: 2015-08-23 | Disposition: A | Discharge status: Home / Self Care | Payer: Medicaid Other | Source: Ambulatory Visit | Attending: Obstetrics and Gynecology | Admitting: Obstetrics and Gynecology

## 2015-08-23 ENCOUNTER — Encounter: Payer: Self-pay | Admitting: Advanced Practice Midwife

## 2015-08-23 DIAGNOSIS — F1290 Cannabis use, unspecified, uncomplicated: Secondary | ICD-10-CM | POA: Diagnosis not present

## 2015-08-23 DIAGNOSIS — O99511 Diseases of the respiratory system complicating pregnancy, first trimester: Secondary | ICD-10-CM | POA: Diagnosis not present

## 2015-08-23 DIAGNOSIS — O210 Mild hyperemesis gravidarum: Secondary | ICD-10-CM | POA: Diagnosis present

## 2015-08-23 DIAGNOSIS — F419 Anxiety disorder, unspecified: Secondary | ICD-10-CM | POA: Insufficient documentation

## 2015-08-23 DIAGNOSIS — R569 Unspecified convulsions: Secondary | ICD-10-CM | POA: Diagnosis not present

## 2015-08-23 DIAGNOSIS — O99331 Smoking (tobacco) complicating pregnancy, first trimester: Secondary | ICD-10-CM | POA: Insufficient documentation

## 2015-08-23 DIAGNOSIS — R51 Headache: Secondary | ICD-10-CM | POA: Insufficient documentation

## 2015-08-23 DIAGNOSIS — J45909 Unspecified asthma, uncomplicated: Secondary | ICD-10-CM | POA: Insufficient documentation

## 2015-08-23 DIAGNOSIS — O99321 Drug use complicating pregnancy, first trimester: Secondary | ICD-10-CM | POA: Diagnosis not present

## 2015-08-23 DIAGNOSIS — Z3A13 13 weeks gestation of pregnancy: Secondary | ICD-10-CM | POA: Diagnosis not present

## 2015-08-23 DIAGNOSIS — F319 Bipolar disorder, unspecified: Secondary | ICD-10-CM | POA: Insufficient documentation

## 2015-08-23 DIAGNOSIS — O99341 Other mental disorders complicating pregnancy, first trimester: Secondary | ICD-10-CM | POA: Insufficient documentation

## 2015-08-23 DIAGNOSIS — Z880 Allergy status to penicillin: Secondary | ICD-10-CM | POA: Diagnosis not present

## 2015-08-23 LAB — COMPREHENSIVE METABOLIC PANEL
ALBUMIN: 3.6 g/dL (ref 3.5–5.0)
ALK PHOS: 59 U/L (ref 38–126)
ALT: 17 U/L (ref 14–54)
ANION GAP: 10 (ref 5–15)
AST: 17 U/L (ref 15–41)
BUN: 8 mg/dL (ref 6–20)
CALCIUM: 8.6 mg/dL — AB (ref 8.9–10.3)
CHLORIDE: 105 mmol/L (ref 101–111)
CO2: 21 mmol/L — AB (ref 22–32)
Creatinine, Ser: 0.4 mg/dL — ABNORMAL LOW (ref 0.44–1.00)
GFR calc non Af Amer: >60 mL/min (ref >60)
GLUCOSE: 86 mg/dL (ref 65–99)
POTASSIUM: 3.3 mmol/L — AB (ref 3.5–5.1)
Sodium: 136 mmol/L (ref 135–145)
Total Bilirubin: 0.4 mg/dL (ref 0.3–1.2)
Total Protein: 7 g/dL (ref 6.5–8.1)

## 2015-08-23 LAB — URINALYSIS COMPLETE WITH MICROSCOPIC (ARMC ONLY)
Bilirubin Urine: NEGATIVE
GLUCOSE, UA: NEGATIVE mg/dL
HGB URINE DIPSTICK: NEGATIVE
NITRITE: NEGATIVE
PH: 6 (ref 5.0–8.0)
Protein, ur: 30 mg/dL — AB
SPECIFIC GRAVITY, URINE: 1.029 (ref 1.005–1.030)

## 2015-08-23 MED ORDER — PROMETHAZINE HCL 25 MG/ML IJ SOLN
12.5000 mg | Freq: Four times a day (QID) | INTRAMUSCULAR | Status: DC | PRN
  Administered 2015-08-23: 12.5 mg via INTRAVENOUS
  Filled 2015-08-23 (×2): qty 1

## 2015-08-23 MED ORDER — LACTATED RINGERS IV SOLN
INTRAVENOUS | Status: DC
  Administered 2015-08-23: 19:00:00 via INTRAVENOUS

## 2015-08-23 MED ORDER — PROMETHAZINE HCL 25 MG PO TABS: 12.5000 mg | ORAL_TABLET | Freq: Four times a day (QID) | ORAL | Status: DC | PRN

## 2015-08-23 MED ORDER — ONDANSETRON HCL 4 MG/2ML IJ SOLN
4.0000 mg | Freq: Four times a day (QID) | INTRAMUSCULAR | Status: DC | PRN
  Administered 2015-08-23: 4 mg via INTRAVENOUS
  Filled 2015-08-23: qty 2

## 2015-08-23 MED ORDER — LACTATED RINGERS IV BOLUS (SEPSIS)
1000.0000 mL | Freq: Once | INTRAVENOUS | Status: AC
  Administered 2015-08-23: 1000 mL via INTRAVENOUS

## 2015-08-23 MED ORDER — ACETAMINOPHEN 10 MG/ML IV SOLN
1000.0000 mg | Freq: Once | INTRAVENOUS | Status: AC
  Administered 2015-08-23: 1000 mg via INTRAVENOUS
  Filled 2015-08-23: qty 100

## 2015-08-23 MED ORDER — PROMETHAZINE HCL 25 MG RE SUPP: 12.5000 mg | Freq: Four times a day (QID) | RECTAL | Status: DC | PRN

## 2015-08-23 NOTE — H&P (Signed)
Obstetric History and Physical  Bailey PoliceBrittany S Cabrera is a 25 y.o. G3P2002 at 3748w2d presenting for in-patient management of hyperemesis. Pt was seen in ER on 6/7 pm and received IV fluids and IV Zofran with good relief. However once medication wore off pt did not tolerate po Zofran. Unsure of when last po intake was (possibly 6/7 pm.) Now also c/o headache. 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   Also recently dx with BV and Trichomonas in ER, has not tolerated Flagyl Rx.   Prenatal Course Source of Care: [facility]  with onset of care at 6 weeks Pregnancy complications or risks: Patient Active Problem List   Diagnosis Date Noted  . Labor and delivery indication for care or intervention 07/20/2014  . Marijuana use 07/20/2014  . 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 Anxiety 07/20/2014  . Depression 07/20/2014  . Bipolar 1 disorder (HCC) 07/20/2014  . Fetal hydronephrosis during pregnancy, antepartum 07/20/2014  . Labor and delivery, indication for care 07/20/2014     Prenatal Transfer Tool   Past Medical History  Diagnosis Date  . Anemia   . Seizures (HCC)     During pregnancy; most recent April 2016  . 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 Hypotension   . Asthma     Past Surgical History  Procedure Laterality Date  . Wisdom tooth extraction Bilateral 2011    Post op infection  . Sonohystogram  2012    OB History  Gravida Para Term Preterm AB SAB TAB Ectopic Multiple Living  3 2 2       0 2    # Outcome Date GA Lbr Len/2nd Weight Sex Delivery Anes PTL Lv  3 Current           2 Term 07/20/14 7179w3d / 00:16 6 lb 15.8 oz (3.17 kg) M Vag-Spont EPI  Y  1 Term 04/14/09 698w0d  7 lb 13 oz (3.544 kg) M Vag-Spont EPI N Y      Social History   Social History  . Marital Status: Married    Spouse Name: N/A  . Number of Children: N/A  . Years of Education: N/A   Social History Main Topics  . Smoking status: Current Every Day Smoker -- 1.00 packs/day  for 5 years    Types: Cigarettes  . Smokeless tobacco: Never Used  . Alcohol Use: No  . Drug Use: Yes    Special: Marijuana     Comment: Marijuana, last used x1 month ago  . Sexual Activity: Not Currently    Birth Control/ Protection: Pill   Other Topics Concern  . None   Social History Narrative    History reviewed. No pertinent family history.  Medications: unable to tolerate   Allergies  Allergen Reactions  . Penicillins Hives and Swelling    Throat swells.    Review of Systems: Negative except for what is mentioned in HPI.  Physical Exam: Wt 150 lb (68.04 kg)  LMP 05/19/2015 GENERAL: appears to feel uncomfortable    Pertinent Labs/Studies:   No results found for this or any previous visit (from the past 24 hour(s)).  Assessment : IUP at 5948w2d, hyperemesis  Plan: IV Fluid bolus then maintenance  CMP and UA IV Zofran and Phenergan NPO for now

## 2015-08-23 NOTE — Progress Notes (Signed)
Discharge instructions reviewed with patient and perscription given. Patient verbalized understanding and discharged home.

## 2015-08-23 NOTE — Discharge Summary (Signed)
S: Pt reports feeling much improved after IV fluid bolus and phenergan and zofran. Desires to go home as older children "crying because she's not there." States she's tolerated po fluids and chicken nuggets without problems.   O: VSS Recent Results (from the past 2160 hour(s))  Urinalysis complete, with microscopic     Status: Abnormal  Comprehensive metabolic panel     Status: Abnormal   Collection Time: 08/23/15  4:22 PM  Result Value Ref Range   Sodium 136 135 - 145 mmol/L   Potassium 3.3 (L) 3.5 - 5.1 mmol/L   Chloride 105 101 - 111 mmol/L   CO2 21 (L) 22 - 32 mmol/L   Glucose, Bld 86 65 - 99 mg/dL   BUN 8 6 - 20 mg/dL   Creatinine, Ser 0.40 (L) 0.44 - 1.00 mg/dL   Calcium 8.6 (L) 8.9 - 10.3 mg/dL   Total Protein 7.0 6.5 - 8.1 g/dL   Albumin 3.6 3.5 - 5.0 g/dL   AST 17 15 - 41 U/L   ALT 17 14 - 54 U/L   Alkaline Phosphatase 59 38 - 126 U/L   Total Bilirubin 0.4 0.3 - 1.2 mg/dL   GFR calc non Af Amer >60 >60 mL/min   GFR calc Af Amer >60 >60 mL/min    Comment: (NOTE) The eGFR has been calculated using the CKD EPI equation. This calculation has not been validated in all clinical situations. eGFR's persistently <60 mL/min signify possible Chronic Kidney Disease.    Anion gap 10 5 - 15  Urinalysis complete, with microscopic ([facility] only)     Status: Abnormal   Collection Time: 08/23/15  4:30 PM  Result Value Ref Range   Color, Urine AMBER (A) YELLOW   APPearance HAZY (A) CLEAR   Glucose, UA NEGATIVE NEGATIVE mg/dL   Bilirubin Urine NEGATIVE NEGATIVE   Ketones, ur 2+ (A) NEGATIVE mg/dL   Specific Gravity, Urine 1.029 1.005 - 1.030   Hgb urine dipstick NEGATIVE NEGATIVE   pH 6.0 5.0 - 8.0   Protein, ur 30 (A) NEGATIVE mg/dL   Nitrite NEGATIVE NEGATIVE   Leukocytes, UA 1+ (A) NEGATIVE   RBC / HPF 0-5 0 - 5 RBC/hpf   WBC, UA 6-30 0 - 5 WBC/hpf   Bacteria, UA RARE (A) NONE SEEN   Squamous Epithelial / LPF 6-30 (A) NONE SEEN   Mucous PRESENT    A: IUP at [redacted]w[redacted]d hyperemesis  (resolved)   P: Reviewed original POM to gradually advance diet and try po meds prior to discharge. Pt reaffirms that she wants to go home now and but will return prn. Has Rx for Zofran ODT already at home. Will Rx for Potassium Chloride 20 meq po BID x 2 wks. F/u as scheduled at office next week.  Encouraged to maintain po hydration.

## 2015-11-12 ENCOUNTER — Emergency Department
Admission: EM | Admit: 2015-11-12 | Discharge: 2015-11-12 | Discharge status: Against Medical Advice | Payer: Medicaid Other

## 2015-11-12 NOTE — ED Notes (Signed)
No answer when called from lobby 

## 2015-12-02 ENCOUNTER — Encounter: Payer: Self-pay | Admitting: *Deleted

## 2015-12-02 ENCOUNTER — Inpatient Hospital Stay
Admission: EM | Admit: 2015-12-02 | Discharge: 2015-12-02 | Disposition: A | Discharge status: Home / Self Care | Payer: Medicaid Other | Attending: Advanced Practice Midwife | Admitting: Advanced Practice Midwife

## 2015-12-02 DIAGNOSIS — I959 Hypotension, unspecified: Secondary | ICD-10-CM | POA: Insufficient documentation

## 2015-12-02 DIAGNOSIS — Z8619 Personal history of other infectious and parasitic diseases: Secondary | ICD-10-CM | POA: Insufficient documentation

## 2015-12-02 DIAGNOSIS — Z880 Allergy status to penicillin: Secondary | ICD-10-CM | POA: Diagnosis not present

## 2015-12-02 DIAGNOSIS — M549 Dorsalgia, unspecified: Secondary | ICD-10-CM | POA: Diagnosis not present

## 2015-12-02 DIAGNOSIS — O99333 Smoking (tobacco) complicating pregnancy, third trimester: Secondary | ICD-10-CM | POA: Insufficient documentation

## 2015-12-02 DIAGNOSIS — O26893 Other specified pregnancy related conditions, third trimester: Secondary | ICD-10-CM | POA: Diagnosis present

## 2015-12-02 DIAGNOSIS — R0981 Nasal congestion: Secondary | ICD-10-CM | POA: Insufficient documentation

## 2015-12-02 DIAGNOSIS — R569 Unspecified convulsions: Secondary | ICD-10-CM | POA: Insufficient documentation

## 2015-12-02 DIAGNOSIS — D649 Anemia, unspecified: Secondary | ICD-10-CM | POA: Diagnosis not present

## 2015-12-02 DIAGNOSIS — F17210 Nicotine dependence, cigarettes, uncomplicated: Secondary | ICD-10-CM | POA: Diagnosis not present

## 2015-12-02 DIAGNOSIS — R102 Pelvic and perineal pain: Secondary | ICD-10-CM | POA: Diagnosis not present

## 2015-12-02 DIAGNOSIS — Z3A28 28 weeks gestation of pregnancy: Secondary | ICD-10-CM | POA: Diagnosis not present

## 2015-12-02 NOTE — OB Triage Note (Addendum)
Patient states she has been having back and pelvic pressure for over a week that comes and goes. Pt also complains of head congestion and green mucus from sinus infection that wont go away that also makes her nauseated and feel short of breath at times. Patient thinks she is also having an HSV out breaking and is taking her valtrex- denies any pain or discomfort associated. Denies bleeding, LOF, or any other concerns.

## 2015-12-02 NOTE — Discharge Summary (Signed)
Patient discharged home, discharge instructions given, patient states understanding. Patient left floor in stable condition, denies any other needs at this time. Patient to keep next scheduled OB appointment 9/20

## 2015-12-02 NOTE — Discharge Instructions (Signed)
Drink plenty of fluid and get plenty of rest. Call your provider for any other concerns °

## 2015-12-02 NOTE — Discharge Summary (Signed)
Physician Final Progress Note  Patient ID: DEKLYN TRACHTENBERG MRN: [id number] DOB/AGE: 07-Feb-1991 25 y.o.  Admit date: 12/02/2015 Admitting provider: Tresea Mall, CNM Discharge date: 12/02/2015   Admission Diagnoses: [id number] at [redacted]w[redacted]d with complaints of pelvic pressure, back pain, nasal congestion with drainage that is sometimes green and sometimes clear, cough. She admits positive fetal movement. She denies contractions, LOF, VB. Pt states she was treated for a sinus infection about a month ago, but her symptoms are back. She denies HA, SOB, F/C. Pt admits orange colored urine recently.  Discharge Diagnoses:  Active Problems: IUP with reassuring NST at [redacted]w[redacted]d. Nasal congestion, round ligament pain, back pain  Hospital Course: Pt was admitted for observation and placed on monitors and labs sent  Past Medical History:  Diagnosis Date  . Anemia   . Asthma   . Herpes genitalis 07/20/2014   Has been tx'd since 32weeks for suppresion  . Hypotension   . Seizures (HCC)    During pregnancy; most recent April 2016    Past Surgical History:  Procedure Laterality Date  . sonohystogram  2012  . WISDOM TOOTH EXTRACTION Bilateral 2011   Post op infection    No current facility-administered medications on file prior to encounter.    Current Outpatient Prescriptions on File Prior to Encounter  Medication Sig Dispense Refill  . Prenatal Vit-Fe Fumarate-FA (MULTIVITAMIN-PRENATAL) 27-0.8 MG TABS tablet Take 1 tablet by mouth daily at 12 noon.    . [DISCONTINUED] albuterol (PROVENTIL HFA;VENTOLIN HFA) 108 (90 BASE) MCG/ACT inhaler Inhale 2 puffs into the lungs every 4 (four) hours as needed for wheezing or shortness of breath. 1 Inhaler 0    Allergies  Allergen Reactions  . Penicillins Hives and Swelling    Throat swells.    Social History   Social History  . Marital status: Married    Spouse name: N/A  . Number of children: N/A  . Years of education: N/A   Occupational History   . Not on file.   Social History Main Topics  . Smoking status: Current Every Day Smoker    Packs/day: 1.00    Years: 5.00    Types: Cigarettes  . Smokeless tobacco: Never Used  . Alcohol use No  . Drug use:     Types: Marijuana     Comment: Marijuana, last used x1 month ago  . Sexual activity: Not Currently    Birth control/ protection: Pill   Other Topics Concern  . Not on file   Social History Narrative  . No narrative on file    Physical Exam: BP 114/65 (BP Location: Right Arm)   Pulse (!) 108   Temp 98.1 F (36.7 C) (Oral)   Resp 18   LMP 05/19/2015   Gen: NAD CV: RRR Pulm: CTAB Pelvic: deferred Toco: negative Fetal Well Being: 135 bpm, moderate variability, +accelerations 10x10, -decelerations Ext: no evidence of DVT  Consults: None  Significant Findings/ Diagnostic Studies: labs: pending  Procedures: NST  Discharge Condition: good  Disposition: discharge to home  Diet: Regular diet  Discharge Activity: Activity as tolerated Comfort measures and safe medications of pregnancy reviewed for back pain, round ligament pain and nasal congestion  Discharge Instructions    Discharge activity:  No Restrictions    Complete by:  As directed    Discharge diet:  No restrictions    Complete by:  As directed    No sexual activity restrictions    Complete by:  As directed    Notify physician  for a general feeling that "something is not right"    Complete by:  As directed    Notify physician for increase or change in vaginal discharge    Complete by:  As directed    Notify physician for intestinal cramps, with or without diarrhea, sometimes described as "gas pain"    Complete by:  As directed    Notify physician for leaking of fluid    Complete by:  As directed    Notify physician for low, dull backache, unrelieved by heat or Tylenol    Complete by:  As directed    Notify physician for menstrual like cramps    Complete by:  As directed    Notify physician  for pelvic pressure    Complete by:  As directed    Notify physician for uterine contractions.  These may be painless and feel like the uterus is tightening or the baby is  "balling up"    Complete by:  As directed    Notify physician for vaginal bleeding    Complete by:  As directed    PRETERM LABOR:  Includes any of the follwing symptoms that occur between 20 - [redacted] weeks gestation.  If these symptoms are not stopped, preterm labor can result in preterm delivery, placing your baby at risk    Complete by:  As directed        Medication List    TAKE these medications   multivitamin-prenatal 27-0.8 MG Tabs tablet Take 1 tablet by mouth daily at 12 noon.      Follow-up Information    [facility] [place] [facility]   Why:  go to regular scheduled prenatal appointment Contact information: [address] [place] [address] [phone]           Total time spent taking care of this patient: 15 minutes  Signed: Tresea MallGLEDHILL,Boston Cookson, CNM  12/02/2015, 2:59 PM

## 2015-12-04 LAB — OB RESULTS CONSOLE HIV ANTIBODY (ROUTINE TESTING): HIV: NONREACTIVE

## 2015-12-19 ENCOUNTER — Observation Stay
Admission: EM | Admit: 2015-12-19 | Discharge: 2015-12-19 | Disposition: A | Discharge status: Home / Self Care | Payer: Medicaid Other | Attending: Obstetrics & Gynecology | Admitting: Obstetrics & Gynecology

## 2015-12-19 DIAGNOSIS — A6000 Herpesviral infection of urogenital system, unspecified: Secondary | ICD-10-CM | POA: Diagnosis not present

## 2015-12-19 DIAGNOSIS — O26893 Other specified pregnancy related conditions, third trimester: Secondary | ICD-10-CM | POA: Diagnosis not present

## 2015-12-19 DIAGNOSIS — Z87891 Personal history of nicotine dependence: Secondary | ICD-10-CM | POA: Diagnosis not present

## 2015-12-19 DIAGNOSIS — R109 Unspecified abdominal pain: Secondary | ICD-10-CM | POA: Diagnosis not present

## 2015-12-19 DIAGNOSIS — E860 Dehydration: Secondary | ICD-10-CM | POA: Insufficient documentation

## 2015-12-19 DIAGNOSIS — O98313 Other infections with a predominantly sexual mode of transmission complicating pregnancy, third trimester: Secondary | ICD-10-CM | POA: Insufficient documentation

## 2015-12-19 DIAGNOSIS — Z79899 Other long term (current) drug therapy: Secondary | ICD-10-CM | POA: Insufficient documentation

## 2015-12-19 DIAGNOSIS — Z3A30 30 weeks gestation of pregnancy: Secondary | ICD-10-CM | POA: Insufficient documentation

## 2015-12-19 DIAGNOSIS — O26899 Other specified pregnancy related conditions, unspecified trimester: Secondary | ICD-10-CM | POA: Diagnosis present

## 2015-12-19 MED ORDER — ONDANSETRON HCL 4 MG/2ML IJ SOLN: 4.0000 mg | Freq: Four times a day (QID) | INTRAMUSCULAR | Status: DC | PRN

## 2015-12-19 MED ORDER — ACETAMINOPHEN 325 MG PO TABS: 650.0000 mg | ORAL_TABLET | ORAL | Status: DC | PRN

## 2015-12-19 NOTE — Discharge Summary (Signed)
Physician Discharge Summary  Patient ID: Bailey Cabrera MRN: [id number] DOB/AGE: 1990/05/02 24 y.o.  Admit date: 12/19/2015 Discharge date: 12/19/2015  Admission Diagnoses:  Discharge Diagnoses:  Active Problems:   Abdominal pain affecting pregnancy   Discharged Condition: good  Hospital Course: See H&P.  No s/sx PTL or fetal concerns.  Consults: None  Significant Diagnostic Studies: A NST procedure was performed with FHR monitoring and a normal baseline established, appropriate time of 20-40 minutes of evaluation, and accels >2 seen w 15x15 characteristics.  Results show a REACTIVE NST.   Treatments: none  Discharge Exam: Blood pressure 115/64, pulse (!) 106, temperature 97.7 F (36.5 C), temperature source Oral, resp. rate 14, last menstrual period 05/19/2015, unknown if currently breastfeeding.  Disposition: 01-Home or Self Care     Medication List    TAKE these medications   multivitamin-prenatal 27-0.8 MG Tabs tablet Take 1 tablet by mouth daily at 12 noon.        Signed: Letitia Libraobert Paul Harris 12/19/2015, 5:18 PM

## 2015-12-19 NOTE — H&P (Signed)
Obstetrics  History & Physical   CC: Abdominal pain in pregnancy  HPI:  25 y.o. [id number] @ 4155w4d (02/23/2016, by Last Menstrual Period). Admitted on 12/19/2015:   Patient Active Problem List   Diagnosis Date Noted  . Abdominal pain affecting pregnancy 12/19/2015  . Hyperemesis affecting pregnancy, antepartum 08/23/2015  . Labor and delivery indication for care or intervention 07/20/2014  . Marijuana use 07/20/2014  . 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 Anxiety 07/20/2014  . Depression 07/20/2014  . Bipolar 1 disorder (HCC) 07/20/2014  . Fetal hydronephrosis during pregnancy, antepartum 07/20/2014  . Labor and delivery, indication for care 07/20/2014     Presents for abdominal pain starting 1 hour ago, no d/c or bleeding or LOF.  Started ZPack yesterday for sinus infection and has been having some nausea related to that.  Decreased fetal movements, yet still some mvts felt.  Pain may be work related, very active. [facility]  PMHx:  Past Medical History:  Diagnosis Date  . Anemia   . Asthma   . Herpes genitalis 07/20/2014   Has been tx'd since 32weeks for suppresion  . Hypotension   . Seizures (HCC)    During pregnancy; most recent April 2016   PSHx:  Past Surgical History:  Procedure Laterality Date  . sonohystogram  2012  . WISDOM TOOTH EXTRACTION Bilateral 2011   Post op infection   Medications:  Prescriptions Prior to Admission  Medication Sig Dispense Refill Last Dose  . Prenatal Vit-Fe Fumarate-FA (MULTIVITAMIN-PRENATAL) 27-0.8 MG TABS tablet Take 1 tablet by mouth daily at 12 noon.   Unknown at Unknown time   Allergies: is allergic to penicillins. OBHx:  OB History  Gravida Para Term Preterm AB Living  4 2 2   1 2   SAB TAB Ectopic Multiple Live Births        0 2    # Outcome Date GA Lbr Len/2nd Weight Sex Delivery Anes PTL Lv  4 Current           3 Term 07/20/14 8421w3d / 00:16 6 lb 15.8 oz (3.17 kg) M Vag-Spont EPI  LIV  2 Term 04/14/09 4672w0d   7 lb 13 oz (3.544 kg) M Vag-Spont EPI N LIV  1 AB              NGE:XBMWUXLK/GMWNUUVOZDGUFHx:Negative/unremarkable except as detailed in HPI. Soc Hx: Former smoker, Alcohol: none, Recreational drug use: none and Pregnancy welcomed  Objective:   Vitals:   12/19/15 1451  BP: 115/64  Pulse: (!) 106  Resp: 14  Temp: 97.7 F (36.5 C)   General: Well nourished, well developed female in no acute distress.  Skin: Warm and dry.  Cardiovascular:Regular rate and rhythm. Respiratory: Clear to auscultation bilateral. Normal respiratory effort Abdomen: mild Back- no CVAT or flank T Extr: no edema Neuro/Psych: Normal mood and affect.  Pelvic deferred EFM:FHR: 130s bpm, variability: moderate,  accelerations:  Present,  decelerations:  Absent Toco: None  Assessment & Plan:   25 y.o. [id number] @ 10055w4d, Admitted on 12/19/2015: Abdominal pain affecting pregnancy, dehydration, decreased fetal movement    Fluids, rest, fetal mvt monitoing

## 2015-12-19 NOTE — OB Triage Note (Signed)
Ms. Bailey Cabrera here with c/o abdominal/back pain for 1 hour, reports she started taking z pack yesterday for sinus infections, vomited multiple times yesterday, has only been able to keep down water today. Denies bleeding, LOF, reports decreased but positive fetal movement. Cannot describe how often the pain occurs.

## 2016-01-27 ENCOUNTER — Observation Stay
Admission: EM | Admit: 2016-01-27 | Discharge: 2016-01-27 | Disposition: A | Discharge status: Home / Self Care | Payer: Medicaid Other | Attending: Obstetrics & Gynecology | Admitting: Obstetrics & Gynecology

## 2016-01-27 DIAGNOSIS — Z79899 Other long term (current) drug therapy: Secondary | ICD-10-CM | POA: Insufficient documentation

## 2016-01-27 DIAGNOSIS — A6000 Herpesviral infection of urogenital system, unspecified: Secondary | ICD-10-CM | POA: Diagnosis not present

## 2016-01-27 DIAGNOSIS — O98513 Other viral diseases complicating pregnancy, third trimester: Secondary | ICD-10-CM | POA: Diagnosis not present

## 2016-01-27 DIAGNOSIS — O99333 Smoking (tobacco) complicating pregnancy, third trimester: Secondary | ICD-10-CM | POA: Insufficient documentation

## 2016-01-27 DIAGNOSIS — Z3A36 36 weeks gestation of pregnancy: Secondary | ICD-10-CM | POA: Insufficient documentation

## 2016-01-27 DIAGNOSIS — O219 Vomiting of pregnancy, unspecified: Secondary | ICD-10-CM | POA: Diagnosis not present

## 2016-01-27 DIAGNOSIS — F17210 Nicotine dependence, cigarettes, uncomplicated: Secondary | ICD-10-CM | POA: Insufficient documentation

## 2016-01-27 LAB — URINALYSIS COMPLETE WITH MICROSCOPIC (ARMC ONLY)
BACTERIA UA: NONE SEEN
Bilirubin Urine: NEGATIVE
Glucose, UA: NEGATIVE mg/dL
Hgb urine dipstick: NEGATIVE
KETONES UR: NEGATIVE mg/dL
Leukocytes, UA: NEGATIVE
NITRITE: NEGATIVE
PH: 8 (ref 5.0–8.0)
PROTEIN: NEGATIVE mg/dL
Specific Gravity, Urine: 1.013 (ref 1.005–1.030)

## 2016-01-27 MED ORDER — PANTOPRAZOLE SODIUM 40 MG PO TBEC
40.0000 mg | DELAYED_RELEASE_TABLET | Freq: Every day | ORAL | Status: DC
  Administered 2016-01-27: 40 mg via ORAL
  Filled 2016-01-27: qty 1

## 2016-01-27 MED ORDER — ONDANSETRON HCL 4 MG/2ML IJ SOLN: 4.0000 mg | Freq: Four times a day (QID) | INTRAMUSCULAR | Status: DC

## 2016-01-27 MED ORDER — ONDANSETRON HCL 4 MG/2ML IJ SOLN
INTRAMUSCULAR | Status: AC
Start: 2016-01-27 — End: 2016-01-27
  Administered 2016-01-27: 4 mg
  Filled 2016-01-27: qty 2

## 2016-01-27 MED ORDER — LACTATED RINGERS IV BOLUS (SEPSIS)
500.0000 mL | Freq: Once | INTRAVENOUS | Status: AC
  Administered 2016-01-27: 500 mL via INTRAVENOUS

## 2016-01-27 MED ORDER — LACTATED RINGERS IV SOLN: INTRAVENOUS | Status: DC

## 2016-01-27 NOTE — Discharge Instructions (Signed)
Discharge instructions given. Drink plenty of fluid and rest. Please return if period like bleeding occurs, water breaks, or contractions 3-5 minutes apart with increasing intensity.

## 2016-01-27 NOTE — Discharge Summary (Signed)
Physician Final Progress Note  Patient ID: Bailey PoliceBrittany S Jhaveri MRN: [id number] DOB/AGE: [birth year] 25 y.o.  Admit date: 01/27/2016 Admitting provider: Nadara Mustardobert P Harris, MD Discharge date: 01/27/2016   Admission Diagnoses: [id number] at 3989w1d with c/o nausea and vomiting since last night. Pt states she ate [place] stew yesterday and started vomiting last night. She admits positive fetal movement. She denies contractions, LOF, VB.  Discharge Diagnoses:  Active Problems:   Indication for care in labor or delivery IUP at 6889w1d with reactive nst, nausea and vomiting improving  Hospital Course: pt was admitted for observation, placed on monitors, IV fluids, medications  Past Medical History:  Diagnosis Date  . Anemia   . Asthma   . Herpes genitalis 07/20/2014   Has been tx'd since 32weeks for suppresion  . Hypotension   . Seizures (HCC)    During pregnancy; most recent April 2016    Past Surgical History:  Procedure Laterality Date  . sonohystogram  2012  . WISDOM TOOTH EXTRACTION Bilateral 2011   Post op infection    No current facility-administered medications on file prior to encounter.    Current Outpatient Prescriptions on File Prior to Encounter  Medication Sig Dispense Refill  . Prenatal Vit-Fe Fumarate-FA (MULTIVITAMIN-PRENATAL) 27-0.8 MG TABS tablet Take 1 tablet by mouth daily at 12 noon.    . [DISCONTINUED] albuterol (PROVENTIL HFA;VENTOLIN HFA) 108 (90 BASE) MCG/ACT inhaler Inhale 2 puffs into the lungs every 4 (four) hours as needed for wheezing or shortness of breath. 1 Inhaler 0    Allergies  Allergen Reactions  . Penicillins Hives and Swelling    Throat swells.    Social History   Social History  . Marital status: Married    Spouse name: N/A  . Number of children: N/A  . Years of education: N/A   Occupational History  . Not on file.   Social History Main Topics  . Smoking status: Current Every Day Smoker    Packs/day: 1.00    Years: 5.00   Types: Cigarettes  . Smokeless tobacco: Never Used  . Alcohol use No  . Drug use:     Types: Marijuana     Comment: Marijuana, last used x1 month ago  . Sexual activity: Not Currently    Birth control/ protection: Pill   Other Topics Concern  . Not on file   Social History Narrative  . No narrative on file    Physical Exam: Temp 97.5 F (36.4 C) (Oral)   Resp 18   Ht 5\' 1"  (1.549 m)   Wt 163 lb (73.9 kg)   LMP 05/19/2015   BMI 30.80 kg/m   Gen: NAD CV: RRR Pulm: CTAB Pelvic: deferred Ext: no evidence of DVT Toco: negative Fetal Well Being: 120 bpm, moderate variability, +accelerations, -decelerations  Consults: None  Significant Findings/ Diagnostic Studies: labs:   Results for Bailey PoliceSHAMBLEY, Amijah S (MRN [id number]) as of 01/27/2016 12:38  Ref. Range 01/27/2016 09:12  Appearance Latest Ref Range: CLEAR  CLEAR (A)  Bacteria, UA Latest Ref Range: NONE SEEN  NONE SEEN  Bilirubin Urine Latest Ref Range: NEGATIVE  NEGATIVE  Color, Urine Latest Ref Range: YELLOW  YELLOW (A)  Glucose Latest Ref Range: NEGATIVE mg/dL NEGATIVE  Hgb urine dipstick Latest Ref Range: NEGATIVE  NEGATIVE  Hyaline Casts, UA Unknown PRESENT  Ketones, ur Latest Ref Range: NEGATIVE mg/dL NEGATIVE  Leukocytes, UA Latest Ref Range: NEGATIVE  NEGATIVE  Mucous Unknown PRESENT  Nitrite Latest Ref Range: NEGATIVE  NEGATIVE  pH Latest  Ref Range: 5.0 - 8.0  8.0  Protein Latest Ref Range: NEGATIVE mg/dL NEGATIVE  RBC / HPF Latest Ref Range: 0 - 5 RBC/hpf 0-5  Specific Gravity, Urine Latest Ref Range: 1.005 - 1.030  1.013  Squamous Epithelial / LPF Latest Ref Range: NONE SEEN  0-5 (A)  WBC, UA Latest Ref Range: 0 - 5 WBC/hpf 0-5    Procedures: NST  Discharge Condition: good  Disposition: 01-Home or Self Care  Diet: advance diet as tolerated to regular diet  Discharge Activity: Activity as tolerated  Discharge Instructions    Discharge activity:  No Restrictions    Complete by:  As directed     Discharge diet:  No restrictions    Complete by:  As directed    Fetal Kick Count:  Lie on our left side for one hour after a meal, and count the number of times your baby kicks.  If it is less than 5 times, get up, move around and drink some juice.  Repeat the test 30 minutes later.  If it is still less than 5 kicks in an hour, notify your doctor.    Complete by:  As directed    No sexual activity restrictions    Complete by:  As directed    Notify physician for a general feeling that "something is not right"    Complete by:  As directed    Notify physician for increase or change in vaginal discharge    Complete by:  As directed    Notify physician for intestinal cramps, with or without diarrhea, sometimes described as "gas pain"    Complete by:  As directed    Notify physician for leaking of fluid    Complete by:  As directed    Notify physician for low, dull backache, unrelieved by heat or Tylenol    Complete by:  As directed    Notify physician for menstrual like cramps    Complete by:  As directed    Notify physician for pelvic pressure    Complete by:  As directed    Notify physician for uterine contractions.  These may be painless and feel like the uterus is tightening or the baby is  "balling up"    Complete by:  As directed    Notify physician for vaginal bleeding    Complete by:  As directed    PRETERM LABOR:  Includes any of the follwing symptoms that occur between 20 - [redacted] weeks gestation.  If these symptoms are not stopped, preterm labor can result in preterm delivery, placing your baby at risk    Complete by:  As directed        Medication List    TAKE these medications   multivitamin-prenatal 27-0.8 MG Tabs tablet Take 1 tablet by mouth daily at 12 noon.   promethazine 25 MG tablet Commonly known as:  PHENERGAN Take 25 mg by mouth every 6 (six) hours as needed for nausea or vomiting.      Follow-up Information    [facility].   Why:   go to regular scheduled prenatal appointment Contact information: [address] [place] [id number] [phone]          Total time spent taking care of this patient: 20 minutes  Signed: Obie DredgeGLEDHILL,Saqib Cazarez, CNM  01/27/2016, 12:34 PM

## 2016-01-27 NOTE — OB Triage Note (Addendum)
Presents with complaint of nausea/ vomiting that started Saturday night and has progressively  Gotten worse. Pt states that she is unable to keep fluids down. States " I feel so dehydrated"   Denies any contractions. . States she is lightheaded as of this morning.

## 2016-01-31 LAB — OB RESULTS CONSOLE RPR: RPR: NONREACTIVE

## 2016-01-31 LAB — OB RESULTS CONSOLE GC/CHLAMYDIA
CHLAMYDIA, DNA PROBE: NEGATIVE
GC PROBE AMP, GENITAL: NEGATIVE

## 2016-01-31 LAB — OB RESULTS CONSOLE GBS: GBS: POSITIVE

## 2016-02-03 ENCOUNTER — Observation Stay
Admission: EM | Admit: 2016-02-03 | Discharge: 2016-02-03 | Disposition: A | Discharge status: Home / Self Care | Payer: Medicaid Other | Attending: Certified Nurse Midwife | Admitting: Certified Nurse Midwife

## 2016-02-03 DIAGNOSIS — R1030 Lower abdominal pain, unspecified: Secondary | ICD-10-CM | POA: Diagnosis not present

## 2016-02-03 DIAGNOSIS — O26893 Other specified pregnancy related conditions, third trimester: Secondary | ICD-10-CM | POA: Diagnosis present

## 2016-02-03 DIAGNOSIS — Z3A37 37 weeks gestation of pregnancy: Secondary | ICD-10-CM | POA: Insufficient documentation

## 2016-02-03 DIAGNOSIS — R102 Pelvic and perineal pain: Secondary | ICD-10-CM | POA: Diagnosis not present

## 2016-02-03 LAB — URINALYSIS COMPLETE WITH MICROSCOPIC (ARMC ONLY)
BACTERIA UA: NONE SEEN
Bilirubin Urine: NEGATIVE
GLUCOSE, UA: NEGATIVE mg/dL
HGB URINE DIPSTICK: NEGATIVE
LEUKOCYTES UA: NEGATIVE
Nitrite: NEGATIVE
PH: 7 (ref 5.0–8.0)
Protein, ur: 30 mg/dL — AB
RBC / HPF: NONE SEEN RBC/hpf (ref 0–5)
Specific Gravity, Urine: 1.017 (ref 1.005–1.030)

## 2016-02-03 MED ORDER — GUAIFENESIN-CODEINE 100-10 MG/5ML PO SOLN
10.0000 mL | Freq: Once | ORAL | Status: AC
  Administered 2016-02-03: 10 mL via ORAL
  Filled 2016-02-03: qty 10

## 2016-02-03 NOTE — Final Progress Note (Addendum)
Physician Final Progress Note  Patient ID: Bailey Cabrera MRN: [id number] DOB/AGE: September 07, 1990 24 y.o.  Admit date: 02/03/2016 Admitting provider: Vena AustriaAndreas Staebler, MD Discharge date: 02/03/2016   Admission Diagnoses: IUP at 37wk1day Lower abdominal pain  Discharge Diagnoses: same as above and  Round ligament pain Consults: none  Significant Findings/ Diagnostic Studies: 25 year old G3 P2002 with EDC= 02/26/2016 presents at 36.5 weeks with complaints of bilateral lower abdominal pains intermittently through the day. "Has been lying in bed all day!" Pains improve with rest, worsen with movement. Is currently been diagnosed with sinusitis and has been coughing. Has also had issues with persistent nausea and vomiting this entire pregnancy. Has not vomited today. Coughing increases pain bilaterally also. No vaginal bleeding. Baby active.  [facility] also remarkable for smoking, marijuana use, Trichimonas, and  HSV. Current medications: valtrex 500 mgm daily, ZPAK, promethazine prn, Zofran prn, protonix daily, Mucinex 12 hr. Social history: smoking cigarettes, marijuana Family history: no breast or ovarian cancer in family Past Surgical HX: none Exam:: BP 116/76   Pulse (!) 106   Temp 98 F (36.7 C) (Oral)   Resp 20   Ht 5\' 1"  (1.549 m)   Wt 72.1 kg (159 lb)   LMP 05/22/2015   BMI 30.04 kg/m   General: appears tired, no acute distress Heart: RRR Lungs: CTA Abdomen: gravid, cephalic presentation, displacing uterus to left or right reproduces pain. Tenderness over lower borders of uterus FHR: 120s baseline with accelerations to 160s to 170s, moderate variability Toco: occasional mild contraction  Results for orders placed or performed during the hospital encounter of 02/03/16 (from the past 24 hour(s))  Urinalysis complete, with microscopic ([facility] only)     Status: Abnormal   Collection Time: 02/03/16  7:46 PM  Result Value Ref Range   Color, Urine YELLOW (A)  YELLOW   APPearance CLEAR (A) CLEAR   Glucose, UA NEGATIVE NEGATIVE mg/dL   Bilirubin Urine NEGATIVE NEGATIVE   Ketones, ur 2+ (A) NEGATIVE mg/dL   Specific Gravity, Urine 1.017 1.005 - 1.030   Hgb urine dipstick NEGATIVE NEGATIVE   pH 7.0 5.0 - 8.0   Protein, ur 30 (A) NEGATIVE mg/dL   Nitrite NEGATIVE NEGATIVE   Leukocytes, UA NEGATIVE NEGATIVE   RBC / HPF NONE SEEN 0 - 5 RBC/hpf   WBC, UA 0-5 0 - 5 WBC/hpf   Bacteria, UA NONE SEEN NONE SEEN   Squamous Epithelial / LPF 0-5 (A) NONE SEEN   Mucous PRESENT   A: Round ligament pain probably exacerbated by cough P: RX for Robitussin with codeine 2 tsp q4 hours prn cough DC home Discussed Tylenol/ maternity support/ warm bath and rest for treatment of round ligament pain.  Procedures: NST-reactive  Discharge Condition: stable  Disposition: 01-Home or Self Care  Diet: Regular diet  Discharge Activity: Activity as tolerated      Total time spent taking care of this patient: 20 minutes  Signed: Farrel ConnersGUTIERREZ, Tashona Calk 02/03/2016, 8:58 PM

## 2016-02-03 NOTE — Discharge Instructions (Signed)
Call provider or return to birthplace with: ° °1. Strong regular contractions every 5 minutes. °2. Leaking of fluid from your vagina °3. Vaginal bleeding: Bright red or heavy like a period °4. Decreased Fetal movement ° °

## 2016-02-03 NOTE — OB Triage Note (Signed)
Pt presents to L&D with c/o abdominal pain and losing her mucous plug. Pt reports abdominal pain all day but reports that she is unsure if they are contractions. Denies LOF or vaginal bleeding, and report good fetal movement. Urine obtained upon arrival, and efm and toco applied and explained. Plan to monitor fetal and maternal well being and assess for labor.

## 2016-02-12 ENCOUNTER — Observation Stay
Admission: EM | Admit: 2016-02-12 | Discharge: 2016-02-12 | Disposition: A | Discharge status: Home / Self Care | Payer: Medicaid Other | Attending: Obstetrics & Gynecology | Admitting: Obstetrics & Gynecology

## 2016-02-12 DIAGNOSIS — Z79899 Other long term (current) drug therapy: Secondary | ICD-10-CM | POA: Diagnosis not present

## 2016-02-12 DIAGNOSIS — R1030 Lower abdominal pain, unspecified: Secondary | ICD-10-CM | POA: Diagnosis not present

## 2016-02-12 DIAGNOSIS — R109 Unspecified abdominal pain: Secondary | ICD-10-CM

## 2016-02-12 DIAGNOSIS — R110 Nausea: Secondary | ICD-10-CM | POA: Diagnosis not present

## 2016-02-12 DIAGNOSIS — O26899 Other specified pregnancy related conditions, unspecified trimester: Secondary | ICD-10-CM | POA: Diagnosis present

## 2016-02-12 MED ORDER — ONDANSETRON HCL 4 MG/2ML IJ SOLN: 4.0000 mg | Freq: Four times a day (QID) | INTRAMUSCULAR | Status: DC | PRN

## 2016-02-12 MED ORDER — ACETAMINOPHEN 325 MG PO TABS: 650.0000 mg | ORAL_TABLET | ORAL | Status: DC | PRN

## 2016-02-12 NOTE — OB Triage Note (Signed)
Pt was walking up steps and fell up the stairs on right side. Now with lower back pain and decreased fetal movement. Pt placed on monitor and oriented to room.

## 2016-02-12 NOTE — Discharge Instructions (Signed)
Take 1000mg  Tylenol and warm compress to back for back pain.

## 2016-02-13 NOTE — Discharge Summary (Signed)
  See FPN 

## 2016-02-13 NOTE — Final Progress Note (Signed)
Physician Final Progress Note  Patient ID: Bailey Cabrera MRN: [id number] DOB/AGE: [birth year] 24 y.o.  Admit date: 02/12/2016 Admitting provider: Nadara Mustardobert P Rohnan Bartleson, MD Discharge date: 02/13/2016   Admission Diagnoses: Nausea, lower abdominal pain  Discharge Diagnoses:  Active Problems:   Abdominal pain affecting pregnancy  Discharge Condition: good  Disposition: 01-Home or Self Care  Diet: Regular diet  Discharge Activity: Activity as tolerated     Medication List    ASK your doctor about these medications   azithromycin 250 MG tablet Commonly known as:  ZITHROMAX Take 250 mg by mouth daily.   multivitamin-prenatal 27-0.8 MG Tabs tablet Take 1 tablet by mouth daily at 12 noon.   ondansetron 8 MG disintegrating tablet Commonly known as:  ZOFRAN-ODT Take 8 mg by mouth every 8 (eight) hours as needed for nausea or vomiting.   promethazine 25 MG tablet Commonly known as:  PHENERGAN Take 25 mg by mouth every 6 (six) hours as needed for nausea or vomiting.   valACYclovir 500 MG tablet Commonly known as:  VALTREX Take 500 mg by mouth daily.        Total time spent taking care of this patient: --- Triage (nurse)   Letitia Libraobert Paul Catrice Zuleta 02/13/2016, 7:21 AM

## 2016-02-23 ENCOUNTER — Observation Stay
Admission: EM | Admit: 2016-02-23 | Discharge: 2016-02-23 | Disposition: A | Discharge status: Home / Self Care | Payer: Medicaid Other | Attending: Obstetrics and Gynecology | Admitting: Obstetrics and Gynecology

## 2016-02-23 ENCOUNTER — Encounter: Payer: Self-pay | Admitting: *Deleted

## 2016-02-23 DIAGNOSIS — O471 False labor at or after 37 completed weeks of gestation: Secondary | ICD-10-CM | POA: Diagnosis present

## 2016-02-23 DIAGNOSIS — Z3A39 39 weeks gestation of pregnancy: Secondary | ICD-10-CM | POA: Diagnosis not present

## 2016-02-23 DIAGNOSIS — R109 Unspecified abdominal pain: Secondary | ICD-10-CM

## 2016-02-23 DIAGNOSIS — O26899 Other specified pregnancy related conditions, unspecified trimester: Secondary | ICD-10-CM

## 2016-02-23 NOTE — OB Triage Note (Signed)
C/O ctx since approx noon yesterday worsening this am at approx 0900. C/o abdominal and back pain. Denies any LOF. Elaina HoopsElks, Alvis Edgell S

## 2016-02-23 NOTE — Discharge Summary (Signed)
Pt evaluated today at 5510w4d with c/o contractions. Cervix was 2/60-70%/-2 by RN's exam. Pt was discharged home with labor precautions.  FHR cat 1 tracing, 150 baseline, + accelerations, no decels Ctx: irregular I did not personally evaluate pt, but received report from RN.

## 2016-02-26 ENCOUNTER — Observation Stay
Admission: EM | Admit: 2016-02-26 | Discharge: 2016-02-26 | Disposition: A | Discharge status: Home / Self Care | Payer: Medicaid Other | Attending: Obstetrics and Gynecology | Admitting: Obstetrics and Gynecology

## 2016-02-26 DIAGNOSIS — Z3A40 40 weeks gestation of pregnancy: Secondary | ICD-10-CM | POA: Diagnosis not present

## 2016-02-26 DIAGNOSIS — O479 False labor, unspecified: Secondary | ICD-10-CM | POA: Diagnosis present

## 2016-02-26 DIAGNOSIS — O471 False labor at or after 37 completed weeks of gestation: Secondary | ICD-10-CM | POA: Diagnosis present

## 2016-02-26 NOTE — Discharge Summary (Signed)
See final progress note. 

## 2016-02-26 NOTE — Final Progress Note (Signed)
Physician Final Progress Note  Patient ID: Bailey Cabrera MRN: [id number] DOB/AGE: [birth year] 24 y.o.  Admit date: 02/26/2016 Admitting provider: Vena AustriaAndreas Neidra Girvan, MD Discharge date: 02/26/2016   Admission Diagnoses: Uterine contractions  Discharge Diagnoses:  Active Problems:   Uterine contractions during pregnancy  25 yo G4P2012 at 1467w0d presenting with irregular uterine contractions cervix unchanged from clinic 3/50/-3 per my exam and still somewhat posterior  Consults: None  Significant Findings/ Diagnostic Studies: none  Procedures: NST 125, moderate variability, +accels, no decels reactive tracing  Discharge Condition: good  Disposition: 01-Home or Self Care  Diet: Regular diet  Discharge Activity: Activity as tolerated  Discharge Instructions    Discharge activity:  No Restrictions    Complete by:  As directed    Fetal Kick Count:  Lie on our left side for one hour after a meal, and count the number of times your baby kicks.  If it is less than 5 times, get up, move around and drink some juice.  Repeat the test 30 minutes later.  If it is still less than 5 kicks in an hour, notify your doctor.    Complete by:  As directed    LABOR:  When conractions begin, you should start to time them from the beginning of one contraction to the beginning  of the next.  When contractions are 5 - 10 minutes apart or less and have been regular for at least an hour, you should call your health care provider.    Complete by:  As directed    No sexual activity restrictions    Complete by:  As directed    Notify physician for bleeding from the vagina    Complete by:  As directed    Notify physician for blurring of vision or spots before the eyes    Complete by:  As directed    Notify physician for chills or fever    Complete by:  As directed    Notify physician for fainting spells, "black outs" or loss of consciousness    Complete by:  As directed    Notify physician for  increase in vaginal discharge    Complete by:  As directed    Notify physician for leaking of fluid    Complete by:  As directed    Notify physician for pain or burning when urinating    Complete by:  As directed    Notify physician for pelvic pressure (sudden increase)    Complete by:  As directed    Notify physician for severe or continued nausea or vomiting    Complete by:  As directed    Notify physician for sudden gushing of fluid from the vagina (with or without continued leaking)    Complete by:  As directed    Notify physician for sudden, constant, or occasional abdominal pain    Complete by:  As directed    Notify physician if baby moving less than usual    Complete by:  As directed        Medication List    TAKE these medications   azithromycin 250 MG tablet Commonly known as:  ZITHROMAX Take 250 mg by mouth daily.   guaiFENesin-codeine 100-10 MG/5ML syrup Take 10 mLs by mouth at bedtime as needed for cough.   multivitamin-prenatal 27-0.8 MG Tabs tablet Take 1 tablet by mouth daily at 12 noon.   ondansetron 8 MG disintegrating tablet Commonly known as:  ZOFRAN-ODT Take 8 mg by mouth every 8 (eight)  hours as needed for nausea or vomiting.   pantoprazole 40 MG tablet Commonly known as:  PROTONIX Take 40 mg by mouth daily.   promethazine 25 MG tablet Commonly known as:  PHENERGAN Take 25 mg by mouth every 6 (six) hours as needed for nausea or vomiting.   valACYclovir 500 MG tablet Commonly known as:  VALTREX Take 500 mg by mouth daily.        Total time spent taking care of this patient: 15 minutes  Signed: Lorrene ReidSTAEBLER, Fadumo Heng M 02/26/2016, 2:58 PM

## 2016-02-26 NOTE — Discharge Instructions (Signed)
Please get plenty of rest and water. Please return if you have increasing intensity with contractions and have a 3-5 minute pattern for over an hour. Please keep your appointment for you induction of labor for 03/04/2016.

## 2016-02-26 NOTE — Plan of Care (Signed)
Pt presents to l/d with c/o contractions that started after having her membranes stripped yesterday. Pt states she is having a small amount of bleeding.

## 2016-02-28 ENCOUNTER — Inpatient Hospital Stay: Payer: Medicaid Other | Admitting: Anesthesiology

## 2016-02-28 ENCOUNTER — Encounter: Payer: Self-pay | Admitting: *Deleted

## 2016-02-28 ENCOUNTER — Inpatient Hospital Stay
Admission: EM | Admit: 2016-02-28 | Discharge: 2016-03-01 | DRG: 767 | Disposition: A | Discharge status: Home / Self Care | Payer: Medicaid Other | Attending: Obstetrics and Gynecology | Admitting: Obstetrics and Gynecology

## 2016-02-28 DIAGNOSIS — R109 Unspecified abdominal pain: Secondary | ICD-10-CM

## 2016-02-28 DIAGNOSIS — O358XX0 Maternal care for other (suspected) fetal abnormality and damage, not applicable or unspecified: Secondary | ICD-10-CM | POA: Diagnosis present

## 2016-02-28 DIAGNOSIS — A6000 Herpesviral infection of urogenital system, unspecified: Secondary | ICD-10-CM | POA: Diagnosis present

## 2016-02-28 DIAGNOSIS — O4212 Full-term premature rupture of membranes, onset of labor more than 24 hours following rupture: Secondary | ICD-10-CM | POA: Diagnosis present

## 2016-02-28 DIAGNOSIS — F17210 Nicotine dependence, cigarettes, uncomplicated: Secondary | ICD-10-CM | POA: Diagnosis present

## 2016-02-28 DIAGNOSIS — O9832 Other infections with a predominantly sexual mode of transmission complicating childbirth: Secondary | ICD-10-CM | POA: Diagnosis present

## 2016-02-28 DIAGNOSIS — O99824 Streptococcus B carrier state complicating childbirth: Secondary | ICD-10-CM | POA: Diagnosis present

## 2016-02-28 DIAGNOSIS — O9952 Diseases of the respiratory system complicating childbirth: Secondary | ICD-10-CM | POA: Diagnosis present

## 2016-02-28 DIAGNOSIS — Z302 Encounter for sterilization: Secondary | ICD-10-CM

## 2016-02-28 DIAGNOSIS — Z3A40 40 weeks gestation of pregnancy: Secondary | ICD-10-CM

## 2016-02-28 DIAGNOSIS — Z3493 Encounter for supervision of normal pregnancy, unspecified, third trimester: Secondary | ICD-10-CM | POA: Diagnosis present

## 2016-02-28 DIAGNOSIS — O99324 Drug use complicating childbirth: Secondary | ICD-10-CM | POA: Diagnosis present

## 2016-02-28 DIAGNOSIS — F1290 Cannabis use, unspecified, uncomplicated: Secondary | ICD-10-CM | POA: Diagnosis present

## 2016-02-28 DIAGNOSIS — Z880 Allergy status to penicillin: Secondary | ICD-10-CM

## 2016-02-28 DIAGNOSIS — O99334 Smoking (tobacco) complicating childbirth: Secondary | ICD-10-CM | POA: Diagnosis present

## 2016-02-28 DIAGNOSIS — O26899 Other specified pregnancy related conditions, unspecified trimester: Secondary | ICD-10-CM

## 2016-02-28 LAB — PROTEIN / CREATININE RATIO, URINE
Creatinine, Urine: 86 mg/dL
PROTEIN CREATININE RATIO: 0.8 mg/mg{creat} — AB (ref 0.00–0.15)
TOTAL PROTEIN, URINE: 69 mg/dL

## 2016-02-28 LAB — CBC
HCT: 31 % — ABNORMAL LOW (ref 35.0–47.0)
Hemoglobin: 10.1 g/dL — ABNORMAL LOW (ref 12.0–16.0)
MCH: 25.8 pg — ABNORMAL LOW (ref 26.0–34.0)
MCHC: 32.4 g/dL (ref 32.0–36.0)
MCV: 79.5 fL — ABNORMAL LOW (ref 80.0–100.0)
Platelets: 200 10*3/uL (ref 150–440)
RBC: 3.91 MIL/uL (ref 3.80–5.20)
RDW: 17.2 % — ABNORMAL HIGH (ref 11.5–14.5)
WBC: 11.6 10*3/uL — ABNORMAL HIGH (ref 3.6–11.0)

## 2016-02-28 LAB — TYPE AND SCREEN
ABO/RH(D): A POS
ANTIBODY SCREEN: NEGATIVE

## 2016-02-28 LAB — COMPREHENSIVE METABOLIC PANEL
ALT: 13 U/L — ABNORMAL LOW (ref 14–54)
ANION GAP: 7 (ref 5–15)
AST: 32 U/L (ref 15–41)
Albumin: 3.1 g/dL — ABNORMAL LOW (ref 3.5–5.0)
Alkaline Phosphatase: 241 U/L — ABNORMAL HIGH (ref 38–126)
BUN: 6 mg/dL (ref 6–20)
CALCIUM: 8.7 mg/dL — AB (ref 8.9–10.3)
CHLORIDE: 105 mmol/L (ref 101–111)
CO2: 22 mmol/L (ref 22–32)
Creatinine, Ser: 0.59 mg/dL (ref 0.44–1.00)
GFR calc non Af Amer: >60 mL/min (ref >60)
Glucose, Bld: 89 mg/dL (ref 65–99)
Potassium: 4.1 mmol/L (ref 3.5–5.1)
SODIUM: 134 mmol/L — AB (ref 135–145)
Total Bilirubin: 0.5 mg/dL (ref 0.3–1.2)
Total Protein: 6.7 g/dL (ref 6.5–8.1)

## 2016-02-28 LAB — URINE DRUG SCREEN, QUALITATIVE (ARMC ONLY)
Amphetamines, Ur Screen: NOT DETECTED
BARBITURATES, UR SCREEN: NOT DETECTED
Benzodiazepine, Ur Scrn: NOT DETECTED
COCAINE METABOLITE, UR ~~LOC~~: NOT DETECTED
Cannabinoid 50 Ng, Ur ~~LOC~~: POSITIVE — AB
MDMA (ECSTASY) UR SCREEN: NOT DETECTED
METHADONE SCREEN, URINE: NOT DETECTED
OPIATE, UR SCREEN: NOT DETECTED
Phencyclidine (PCP) Ur S: NOT DETECTED
Tricyclic, Ur Screen: NOT DETECTED

## 2016-02-28 MED ORDER — ACETAMINOPHEN 325 MG PO TABS: 650.0000 mg | ORAL_TABLET | ORAL | Status: DC | PRN

## 2016-02-28 MED ORDER — OXYTOCIN 10 UNIT/ML IJ SOLN
INTRAMUSCULAR | Status: AC
  Filled 2016-02-28: qty 2

## 2016-02-28 MED ORDER — FENTANYL 2.5 MCG/ML W/ROPIVACAINE 0.2% IN NS 100 ML EPIDURAL INFUSION (ARMC-ANES)
EPIDURAL | Status: AC
  Filled 2016-02-28: qty 100

## 2016-02-28 MED ORDER — WITCH HAZEL-GLYCERIN EX PADS: 1.0000 "application " | MEDICATED_PAD | CUTANEOUS | Status: DC | PRN

## 2016-02-28 MED ORDER — FENTANYL 2.5 MCG/ML W/ROPIVACAINE 0.2% IN NS 100 ML EPIDURAL INFUSION (ARMC-ANES): 10.0000 mL/h | EPIDURAL | Status: DC

## 2016-02-28 MED ORDER — DIPHENHYDRAMINE HCL 25 MG PO CAPS: 25.0000 mg | ORAL_CAPSULE | Freq: Four times a day (QID) | ORAL | Status: DC | PRN

## 2016-02-28 MED ORDER — DIPHENHYDRAMINE HCL 25 MG PO CAPS: 25.0000 mg | ORAL_CAPSULE | ORAL | Status: DC | PRN

## 2016-02-28 MED ORDER — ONDANSETRON HCL 4 MG PO TABS: 4.0000 mg | ORAL_TABLET | ORAL | Status: DC | PRN

## 2016-02-28 MED ORDER — OXYCODONE-ACETAMINOPHEN 5-325 MG PO TABS
2.0000 | ORAL_TABLET | ORAL | Status: DC | PRN
  Administered 2016-02-28 – 2016-03-01 (×8): 2 via ORAL
  Filled 2016-02-28 (×8): qty 2

## 2016-02-28 MED ORDER — LACTATED RINGERS IV SOLN: 500.0000 mL | INTRAVENOUS | Status: DC | PRN

## 2016-02-28 MED ORDER — PRENATAL MULTIVITAMIN CH
1.0000 | ORAL_TABLET | Freq: Every day | ORAL | Status: DC
  Administered 2016-02-28 – 2016-03-01 (×2): 1 via ORAL
  Filled 2016-02-28 (×2): qty 1

## 2016-02-28 MED ORDER — MISOPROSTOL 200 MCG PO TABS
ORAL_TABLET | ORAL | Status: AC
  Filled 2016-02-28: qty 4

## 2016-02-28 MED ORDER — LACTATED RINGERS IV SOLN
INTRAVENOUS | Status: DC
  Administered 2016-02-28: 18:00:00 via INTRAVENOUS

## 2016-02-28 MED ORDER — NALOXONE HCL 0.4 MG/ML IJ SOLN: 0.4000 mg | INTRAMUSCULAR | Status: DC | PRN

## 2016-02-28 MED ORDER — NALBUPHINE HCL 10 MG/ML IJ SOLN: 5.0000 mg | Freq: Once | INTRAMUSCULAR | Status: DC | PRN

## 2016-02-28 MED ORDER — VANCOMYCIN HCL IN DEXTROSE 1-5 GM/200ML-% IV SOLN
1000.0000 mg | Freq: Two times a day (BID) | INTRAVENOUS | Status: DC
  Administered 2016-02-28: 1000 mg via INTRAVENOUS
  Filled 2016-02-28 (×2): qty 200

## 2016-02-28 MED ORDER — NALBUPHINE HCL 10 MG/ML IJ SOLN: 5.0000 mg | INTRAMUSCULAR | Status: DC | PRN

## 2016-02-28 MED ORDER — METOCLOPRAMIDE HCL 10 MG PO TABS: 10.0000 mg | ORAL_TABLET | Freq: Once | ORAL | Status: DC

## 2016-02-28 MED ORDER — LIDOCAINE HCL (PF) 1 % IJ SOLN
INTRAMUSCULAR | Status: DC | PRN
  Administered 2016-02-28: 3 mL via SUBCUTANEOUS

## 2016-02-28 MED ORDER — SIMETHICONE 80 MG PO CHEW: 80.0000 mg | CHEWABLE_TABLET | ORAL | Status: DC | PRN

## 2016-02-28 MED ORDER — BUPIVACAINE HCL (PF) 0.25 % IJ SOLN
INTRAMUSCULAR | Status: DC | PRN
  Administered 2016-02-28: 1 mL via INTRATHECAL

## 2016-02-28 MED ORDER — OXYTOCIN 10 UNIT/ML IJ SOLN: 10.0000 [IU] | Freq: Once | INTRAMUSCULAR | Status: DC

## 2016-02-28 MED ORDER — OXYTOCIN 40 UNITS IN LACTATED RINGERS INFUSION - SIMPLE MED: 2.5000 [IU]/h | INTRAVENOUS | Status: DC

## 2016-02-28 MED ORDER — SOD CITRATE-CITRIC ACID 500-334 MG/5ML PO SOLN: 30.0000 mL | ORAL | Status: DC | PRN

## 2016-02-28 MED ORDER — OXYCODONE-ACETAMINOPHEN 5-325 MG PO TABS
1.0000 | ORAL_TABLET | ORAL | Status: DC | PRN
  Administered 2016-02-29 (×2): 1 via ORAL
  Filled 2016-02-28 (×2): qty 1

## 2016-02-28 MED ORDER — DIBUCAINE 1 % RE OINT: 1.0000 "application " | TOPICAL_OINTMENT | RECTAL | Status: DC | PRN

## 2016-02-28 MED ORDER — FENTANYL 2.5 MCG/ML W/ROPIVACAINE 0.2% IN NS 100 ML EPIDURAL INFUSION (ARMC-ANES)
EPIDURAL | Status: DC | PRN
  Administered 2016-02-28: 9 mL/h via EPIDURAL

## 2016-02-28 MED ORDER — DIPHENHYDRAMINE HCL 50 MG/ML IJ SOLN: 12.5000 mg | INTRAMUSCULAR | Status: DC | PRN

## 2016-02-28 MED ORDER — OXYTOCIN 40 UNITS IN LACTATED RINGERS INFUSION - SIMPLE MED
INTRAVENOUS | Status: AC
  Administered 2016-02-28: 500 mL via INTRAVENOUS
  Filled 2016-02-28: qty 1000

## 2016-02-28 MED ORDER — FAMOTIDINE 20 MG PO TABS: 40.0000 mg | ORAL_TABLET | Freq: Once | ORAL | Status: DC

## 2016-02-28 MED ORDER — BENZOCAINE-MENTHOL 20-0.5 % EX AERO: 1.0000 "application " | INHALATION_SPRAY | CUTANEOUS | Status: DC | PRN

## 2016-02-28 MED ORDER — LIDOCAINE HCL (PF) 1 % IJ SOLN
INTRAMUSCULAR | Status: AC
  Filled 2016-02-28: qty 30

## 2016-02-28 MED ORDER — SODIUM CHLORIDE 0.9% FLUSH: 3.0000 mL | INTRAVENOUS | Status: DC | PRN

## 2016-02-28 MED ORDER — COCONUT OIL OIL: 1.0000 "application " | TOPICAL_OIL | Status: DC | PRN

## 2016-02-28 MED ORDER — NALOXONE HCL 2 MG/2ML IJ SOSY
1.0000 ug/kg/h | PREFILLED_SYRINGE | INTRAVENOUS | Status: DC | PRN
  Filled 2016-02-28: qty 2

## 2016-02-28 MED ORDER — AMMONIA AROMATIC IN INHA
RESPIRATORY_TRACT | Status: AC
  Filled 2016-02-28: qty 10

## 2016-02-28 MED ORDER — LIDOCAINE HCL (PF) 1 % IJ SOLN: 30.0000 mL | INTRAMUSCULAR | Status: DC | PRN

## 2016-02-28 MED ORDER — LACTATED RINGERS IV SOLN
INTRAVENOUS | Status: DC
  Administered 2016-02-28: 07:00:00 via INTRAVENOUS

## 2016-02-28 MED ORDER — IBUPROFEN 600 MG PO TABS
600.0000 mg | ORAL_TABLET | Freq: Four times a day (QID) | ORAL | Status: DC
  Administered 2016-02-28 – 2016-03-01 (×8): 600 mg via ORAL
  Filled 2016-02-28 (×8): qty 1

## 2016-02-28 MED ORDER — ONDANSETRON HCL 4 MG/2ML IJ SOLN: 4.0000 mg | INTRAMUSCULAR | Status: DC | PRN

## 2016-02-28 MED ORDER — ONDANSETRON HCL 4 MG/2ML IJ SOLN: 4.0000 mg | Freq: Four times a day (QID) | INTRAMUSCULAR | Status: DC | PRN

## 2016-02-28 MED ORDER — OXYTOCIN BOLUS FROM INFUSION
500.0000 mL | Freq: Once | INTRAVENOUS | Status: AC
  Administered 2016-02-28: 500 mL via INTRAVENOUS

## 2016-02-28 MED ORDER — SENNOSIDES-DOCUSATE SODIUM 8.6-50 MG PO TABS
2.0000 | ORAL_TABLET | ORAL | Status: DC
  Administered 2016-02-28: 2 via ORAL
  Filled 2016-02-28: qty 2

## 2016-02-28 NOTE — Anesthesia Procedure Notes (Signed)
Epidural Patient location during procedure: OB  Staffing Anesthesiologist: Priscella MannPENWARDEN, AMY Resident/CRNA: Mathews ArgyleLOGAN, Navya Timmons Performed: resident/CRNA   Preanesthetic Checklist Completed: patient identified, site marked, surgical consent, pre-op evaluation, timeout performed, IV checked, risks and benefits discussed and monitors and equipment checked  Epidural Patient position: sitting Prep: ChloraPrep and site prepped and draped Patient monitoring: heart rate, continuous pulse ox and blood pressure Approach: midline Location: L4-L5 Injection technique: LOR saline  Needle:  Needle type: Tuohy  Needle gauge: 17 G Needle length: 9 cm and 9 Needle insertion depth: 8 cm Catheter type: closed end flexible Catheter size: 19 Gauge Catheter at skin depth: 12 cm  Assessment Sensory level: T10 Events: blood not aspirated, injection not painful, no injection resistance, negative IV test and no paresthesia  Additional Notes Performed CSE with 25ga pencil tip needle thru 17 ga tuohy.  LORS at 8cm, then spinal needle inserted with CSF return.  1ml 0.25% bupivacaine admin. Followed by epidural cath insertion via tuohy needle to 12  Cm at skin.   Pt. Evaluated and documentation done after procedure finished. Patient identified. Risks/Benefits/Options discussed with patient including but not limited to bleeding, infection, nerve damage, paralysis, failed block, incomplete pain control, headache, blood pressure changes, nausea, vomiting, reactions to medication both or allergic, itching and postpartum back pain. Confirmed with bedside nurse the patient's most recent platelet count. Confirmed with patient that they are not currently taking any anticoagulation, have any bleeding history or any family history of bleeding disorders. Patient expressed understanding and wished to proceed. All questions were answered. Sterile technique was used throughout the entire procedure. Please see nursing notes for vital  signs. Test dose was given through epidural catheter and negative prior to continuing to dose epidural or start infusion. Warning signs of high block given to the patient including shortness of breath, tingling/numbness in hands, complete motor block, or any concerning symptoms with instructions to call for help. Patient was given instructions on fall risk and not to get out of bed. All questions and concerns addressed with instructions to call with any issues or inadequate analgesia.   Patient tolerated the insertion well without immediate complications.Reason for block:procedure for pain

## 2016-02-28 NOTE — Anesthesia Preprocedure Evaluation (Addendum)
Anesthesia Evaluation  Patient identified by MRN, date of birth, ID band Patient awake    Reviewed: Allergy & Precautions, NPO status , Patient's Chart, lab work & pertinent test results  History of Anesthesia Complications Negative for: history of anesthetic complications  Airway Mallampati: II  TM Distance: >3 FB Neck ROM: Full    Dental no notable dental hx.    Pulmonary asthma , Current Smoker,    breath sounds clear to auscultation- rhonchi (-) wheezing      Cardiovascular Exercise Tolerance: Good (-) hypertension(-) CAD and (-) Past MI  Rhythm:Regular Rate:Normal - Systolic murmurs and - Diastolic murmurs    Neuro/Psych Seizures -,  Anxiety Depression Bipolar Disorder    GI/Hepatic negative GI ROS, Neg liver ROS,   Endo/Other  negative endocrine ROSneg diabetes  Renal/GU negative Renal ROS     Musculoskeletal negative musculoskeletal ROS (+)   Abdominal Gravid abdomen  Peds  Hematology  (+) anemia ,   Anesthesia Other Findings Past Medical History: No date: Anemia No date: Asthma 07/20/2014: Herpes genitalis     Comment: Has been tx'd since 32weeks for suppresion No date: Hypotension No date: Seizures (HCC)     Comment: During pregnancy; most recent April 2016   Reproductive/Obstetrics (+) Pregnancy                             Anesthesia Physical Anesthesia Plan  ASA: III  Anesthesia Plan: Combined Spinal and Epidural   Post-op Pain Management:    Induction:   Airway Management Planned:   Additional Equipment:   Intra-op Plan:   Post-operative Plan:   Informed Consent: I have reviewed the patients History and Physical, chart, labs and discussed the procedure including the risks, benefits and alternatives for the proposed anesthesia with the patient or authorized representative who has indicated his/her understanding and acceptance.     Plan Discussed with:  Anesthesiologist and CRNA  Anesthesia Plan Comments: (Plan for CSE for labor, discussed epidural vs spinal vs GA if need for csection)        Anesthesia Quick Evaluation

## 2016-02-28 NOTE — Discharge Summary (Signed)
Obstetric Discharge Summary Reason for Admission: onset of labor Prenatal Procedures: none Intrapartum Procedures: spontaneous vaginal delivery Postpartum Procedures: P.P. tubal ligation Complications-Operative and Postpartum: none Hemoglobin  Date Value Ref Range Status  02/28/2016 10.1 (L) 12.0 - 16.0 g/dL Final  16/10/960403/03/2014 54.011.2 g/dL Final   HGB  Date Value Ref Range Status  07/09/2014 11.8 (L) 12.0 - 16.0 g/dL Final   HCT  Date Value Ref Range Status  02/28/2016 31.0 (L) 35.0 - 47.0 % Final  07/09/2014 35.4 35.0 - 47.0 % Final  05/15/2014 33 % Final    Physical Exam:  BP 135/78 (BP Location: Right Arm)   Pulse 84   Temp 98.2 F (36.8 C) (Oral)   Resp 18   Ht 5\' 1"  (1.549 m)   Wt 160 lb (72.6 kg)   LMP 05/22/2015   SpO2 99%   Breastfeeding? Unknown   BMI 30.23 kg/m   General: alert and no distress Lochia: appropriate Uterine Fundus: firm Incision: healing well DVT Evaluation: No evidence of DVT seen on physical exam. No cords or calf tenderness. No significant calf/ankle edema.  Discharge Diagnoses: Term Pregnancy-delivered  Discharge Information: Date: 02/28/2016 Activity: pelvic rest Diet: routine Medications: PNV, Ibuprofen and Percocet Condition: stable Discharge to: home Follow-up Information    Lorrene ReidSTAEBLER, ANDREAS M, MD Follow up in 6 week(s).   Specialty:  Obstetrics and Gynecology Contact information: [address] [place] [address] [phone]           Newborn Data: Live born female  Birth Weight:   APGAR: 8, 9  Home with mother.   Thomasene MohairStephen Jackson, MD 03/01/2016 11:59 AM

## 2016-02-28 NOTE — Progress Notes (Signed)
Mild range BP, otherwise asymptomatic.  Will check CMP and P/C ratio

## 2016-02-28 NOTE — Progress Notes (Signed)
Dr. Bonney AidStaebler notified of PCR of 0.8 and patients recent Bp's. Verbal order given to call for blood pressures greater than or equal to 150 systolic and/or 110 diastolic. Will continue to monitor.    Oswald HillockAbigail Garner, RN

## 2016-02-28 NOTE — H&P (Signed)
OB History & Physical   History of Present Illness:  Chief Complaint: contractions  HPI:  Nicola PoliceBrittany S Stafford is a 25 y.o. [phone] female at 2757w2d dated by LMP consistent with her 7 week ultrasound.  Her pregnancy has been complicated by hyperemesis, smoking, +cannabanoid use, trichomonas, mild right fetal pelviectasis.    She reports contractions.   She reports leakage of fluid (she had a large gush of amniotic fluid just after arrival on L&D).   She denies vaginal bleeding.   She reports fetal movement.    Maternal Medical History:   Past Medical History:  Diagnosis Date  . Anemia   . Asthma   . Herpes genitalis 07/20/2014   Has been tx'd since 32weeks for suppresion  . Hypotension   . Seizures (HCC)    During pregnancy; most recent April 2016    Past Surgical History:  Procedure Laterality Date  . sonohystogram  2012  . WISDOM TOOTH EXTRACTION Bilateral 2011   Post op infection    Allergies  Allergen Reactions  . Penicillins Hives and Swelling    Throat swells.    Prior to Admission medications   Medication Sig Start Date End Date Taking? Authorizing Provider  ondansetron (ZOFRAN-ODT) 8 MG disintegrating tablet Take 8 mg by mouth every 8 (eight) hours as needed for nausea or vomiting.   Yes Historical Provider, MD  pantoprazole (PROTONIX) 40 MG tablet Take 40 mg by mouth daily.   Yes Historical Provider, MD  Prenatal Vit-Fe Fumarate-FA (MULTIVITAMIN-PRENATAL) 27-0.8 MG TABS tablet Take 1 tablet by mouth daily at 12 noon.   Yes Historical Provider, MD  promethazine (PHENERGAN) 25 MG tablet Take 25 mg by mouth every 6 (six) hours as needed for nausea or vomiting.   Yes Historical Provider, MD  valACYclovir (VALTREX) 500 MG tablet Take 500 mg by mouth daily.   Yes Historical Provider, MD    OB History  Gravida Para Term Preterm AB Living  4 2 2   1 2   SAB TAB Ectopic Multiple Live Births        0 2    # Outcome Date GA Lbr Len/2nd Weight Sex Delivery Anes PTL Lv  4  Current           3 Term 07/20/14 4359w3d / 00:16 6 lb 15.8 oz (3.17 kg) M Vag-Spont EPI  LIV  2 Term 04/14/09 4967w0d  7 lb 13 oz (3.544 kg) M Vag-Spont EPI N LIV  1 AB               Prenatal care site: [facility]  Social History: She  reports that she has been smoking Cigarettes.  She has a 2.50 pack-year smoking history. She has never used smokeless tobacco. She reports that she uses drugs, including Marijuana. She reports that she does not drink alcohol.  Family History: family history is not on file.   Review of Systems: Negative x 10 systems reviewed except as noted in the HPI.    Physical Exam:  Vital Signs: BP (!) 130/93   Pulse 86   Temp 97.5 F (36.4 C) (Oral)   Resp 16   Ht 5\' 1"  (1.549 m)   Wt 160 lb (72.6 kg)   LMP 05/22/2015   BMI 30.23 kg/m  General: moderate distress.  HEENT: normocephalic, atraumatic Lungs: moves air well Abdomen: soft, gravid, non-tender;  EFW: (8 pounds) Pelvic: (female chaperone present during pelvic exam)  External: Normal external female genitalia  Cervix: Dilation: 7 / Effacement (%):  90 / Station: 0   ROM: gross rupture Extremities: non-tender, symmetric, no edema bilaterally.    Neurologic: Alert & oriented x 3.    Pertinent Results:  Prenatal Labs: Blood type/Rh A positive  Antibody screen negative  Rubella Immune  Varicella Immune    RPR NR  HBsAg negative  HIV negative  GC negative  Chlamydia negative  Genetic screening 1st trimester neg, msAFP neg  1 hour GTT normal  3 hour GTT n/a  GBS Positive - resistant to clindamycin   Baseline FHR: 125 beats/min   Variability: moderate   Accelerations: present   Decelerations: present (occasional periodic variables) Contractions: present frequency: 3 q 10 min Overall assessment: category 2, very reassuring overall given clinical scenario  Assessment:  Nicola PoliceBrittany S Anglin is a 25 y.o. 934P2012 female at 7934w2d with advanced, active labor.  Patient presented to L&D with  dilation to 5cm and quickly progressed to 9-10 cm.  She had spontaneous rupture of membranes just after arrival to L&D.   Plan:  1. Admit to Labor & Delivery  2. CBC, T&S, Clrs, IVF 3. GBS positive.  Vancomycin given severe PCN allergy and resistance to clindamycin. 4. Fetwal well-being: reassuring overall 5. Did not have ample opportunity to assess for herpetic lesions prior to delivery.  Will assess at time of delivery (which will be very soon) and notify pediatrics.   Conard NovakJackson, Mykael Batz D, MD 02/28/2016 7:03 AM

## 2016-02-28 NOTE — OB Triage Note (Signed)
Recvd pt from ED. C/o contractions every 2-3 min that started around 0230 this morning. Feeling baby move ok.

## 2016-02-29 ENCOUNTER — Encounter: Payer: Self-pay | Admitting: Anesthesiology

## 2016-02-29 ENCOUNTER — Inpatient Hospital Stay: Payer: Medicaid Other | Admitting: Anesthesiology

## 2016-02-29 ENCOUNTER — Encounter
Admission: EM | Disposition: A | Discharge status: Home / Self Care | Payer: Self-pay | Source: Home / Self Care | Attending: Obstetrics and Gynecology

## 2016-02-29 HISTORY — PX: TUBAL LIGATION: SHX77

## 2016-02-29 LAB — CBC
HEMATOCRIT: 25.4 % — AB (ref 35.0–47.0)
HEMOGLOBIN: 8.1 g/dL — AB (ref 12.0–16.0)
MCH: 24.9 pg — ABNORMAL LOW (ref 26.0–34.0)
MCHC: 31.9 g/dL — ABNORMAL LOW (ref 32.0–36.0)
MCV: 78 fL — AB (ref 80.0–100.0)
Platelets: 189 10*3/uL (ref 150–440)
RBC: 3.25 MIL/uL — AB (ref 3.80–5.20)
RDW: 17 % — ABNORMAL HIGH (ref 11.5–14.5)
WBC: 11.5 10*3/uL — AB (ref 3.6–11.0)

## 2016-02-29 LAB — RPR: RPR Ser Ql: NONREACTIVE

## 2016-02-29 SURGERY — LIGATION, FALLOPIAN TUBE, POSTPARTUM
Anesthesia: General | Site: Abdomen | Wound class: Clean Contaminated

## 2016-02-29 MED ORDER — OXYCODONE HCL 5 MG/5ML PO SOLN: 5.0000 mg | Freq: Once | ORAL | Status: DC | PRN

## 2016-02-29 MED ORDER — FENTANYL CITRATE (PF) 100 MCG/2ML IJ SOLN
INTRAMUSCULAR | Status: DC | PRN
  Administered 2016-02-29: 100 ug via INTRAVENOUS

## 2016-02-29 MED ORDER — SUCCINYLCHOLINE CHLORIDE 20 MG/ML IJ SOLN
INTRAMUSCULAR | Status: DC | PRN
  Administered 2016-02-29: 80 mg via INTRAVENOUS

## 2016-02-29 MED ORDER — LIDOCAINE HCL (CARDIAC) 20 MG/ML IV SOLN
INTRAVENOUS | Status: DC | PRN
Start: 2016-02-29 — End: 2016-02-29
  Administered 2016-02-29: 50 mg via INTRAVENOUS

## 2016-02-29 MED ORDER — LACTATED RINGERS IV SOLN
INTRAVENOUS | Status: DC | PRN
  Administered 2016-02-29: 09:00:00 via INTRAVENOUS

## 2016-02-29 MED ORDER — MENTHOL 3 MG MT LOZG
1.0000 | LOZENGE | OROMUCOSAL | Status: DC | PRN
  Filled 2016-02-29: qty 9

## 2016-02-29 MED ORDER — SUGAMMADEX SODIUM 500 MG/5ML IV SOLN
INTRAVENOUS | Status: DC | PRN
  Administered 2016-02-29: 200 mg via INTRAVENOUS

## 2016-02-29 MED ORDER — BUPIVACAINE-EPINEPHRINE 0.25% -1:200000 IJ SOLN
INTRAMUSCULAR | Status: DC | PRN
  Administered 2016-02-29: 6 mL

## 2016-02-29 MED ORDER — OXYCODONE HCL 5 MG PO TABS: 5.0000 mg | ORAL_TABLET | Freq: Once | ORAL | Status: DC | PRN

## 2016-02-29 MED ORDER — LACTATED RINGERS IV SOLN: INTRAVENOUS | Status: DC

## 2016-02-29 MED ORDER — FENTANYL CITRATE (PF) 100 MCG/2ML IJ SOLN: 25.0000 ug | INTRAMUSCULAR | Status: DC | PRN

## 2016-02-29 MED ORDER — ROCURONIUM BROMIDE 100 MG/10ML IV SOLN
INTRAVENOUS | Status: DC | PRN
  Administered 2016-02-29: 20 mg via INTRAVENOUS

## 2016-02-29 MED ORDER — PROPOFOL 10 MG/ML IV BOLUS
INTRAVENOUS | Status: DC | PRN
  Administered 2016-02-29: 140 mg via INTRAVENOUS

## 2016-02-29 MED ORDER — HYDROMORPHONE HCL 1 MG/ML IJ SOLN: 1.0000 mg | INTRAMUSCULAR | Status: DC | PRN

## 2016-02-29 SURGICAL SUPPLY — 24 items
BLADE SURG SZ11 CARB STEEL (BLADE) ×2 IMPLANT
CHLORAPREP W/TINT 26ML (MISCELLANEOUS) ×2 IMPLANT
DRAPE LAPAROTOMY 100X77 ABD (DRAPES) ×2 IMPLANT
ELECT CAUTERY BLADE 6.4 (BLADE) ×2 IMPLANT
ELECT REM PT RETURN 9FT ADLT (ELECTROSURGICAL) ×2
ELECTRODE REM PT RTRN 9FT ADLT (ELECTROSURGICAL) ×1 IMPLANT
GLOVE BIO SURGEON STRL SZ7 (GLOVE) ×2 IMPLANT
GLOVE BIOGEL PI IND STRL 7.5 (GLOVE) ×1 IMPLANT
GLOVE BIOGEL PI INDICATOR 7.5 (GLOVE) ×1
GOWN STRL REUS W/ TWL LRG LVL3 (GOWN DISPOSABLE) ×2 IMPLANT
GOWN STRL REUS W/ TWL XL LVL3 (GOWN DISPOSABLE) ×1 IMPLANT
GOWN STRL REUS W/TWL LRG LVL3 (GOWN DISPOSABLE) ×2
GOWN STRL REUS W/TWL XL LVL3 (GOWN DISPOSABLE) ×1
KIT RM TURNOVER CYSTO AR (KITS) ×2 IMPLANT
LABEL OR SOLS (LABEL) ×2 IMPLANT
LIQUID BAND (GAUZE/BANDAGES/DRESSINGS) ×2 IMPLANT
NDL SAFETY 22GX1.5 (NEEDLE) ×2 IMPLANT
NS IRRIG 500ML POUR BTL (IV SOLUTION) ×2 IMPLANT
PACK BASIN MINOR ARMC (MISCELLANEOUS) ×2 IMPLANT
SUT PLAIN GUT 0 (SUTURE) ×4 IMPLANT
SUT VIC AB 2-0 UR6 27 (SUTURE) ×2 IMPLANT
SUT VIC AB 4-0 SH 27 (SUTURE) ×1
SUT VIC AB 4-0 SH 27XANBCTRL (SUTURE) ×1 IMPLANT
SYRINGE 10CC LL (SYRINGE) ×2 IMPLANT

## 2016-02-29 NOTE — Op Note (Signed)
Op Note Postpartum Tubal Ligation  Pre-Op Diagnosis: multiparity, desires permanent sterilization  Post-Op Diagnosis: multiparity, desires permanent sterilization  Procedures:  Postpartum tubal ligation via Pomeroy method  Primary Surgeon: Thomasene MohairStephen Aune Adami, MD  EBL: 10 ml   IVF: 800 mL   Specimens: portion of right and left fallopian tubes  Drains: None  Complications: None   Disposition: PACU   Condition: Stable   Findings: normal appearing bilateral fallopian tubes  Indication: The patient is a 25 y.o. Q4O9629G4P3013 who is postpartum day 1 status post spontaneous vaginal delivery.  She has been counseled extensively regarding risks, benefits, and alternatives to tubal ligation, including non-permanent forms of contraception that are equivalent in efficacy with potentially better side effects.  She has been advised that there is a failure rate of 3-5 in every 1,000 tubal ligations per year with an increased risk of ectopic pregnancy should pregnancy occur.   Procedure Summary:  The patient was taken to the operating room where general anesthesia was administered and found to be adequate. After timeout was called a small transverse, infraumbilical incision was made with the scalpel. The incision was carried down through the fascia until the peritoneum was identified and entered. The peritoneum was noted to be free of any adhesions and the incision was then extended.  The patient's left fallopian tube was identified, brought incision, and grasped with a Babcock clamp. The tube was then followed out to the fimbria. The Babcock clamp was then used to grasp the tube approximately 4 cm from the cornual region. A 3 cm segment of tube was then ligated with the 2 free ties of plain gut, and excised. Good hemostasis was noted and the tube was returned to the abdomen. The right fallopian tube was then ligated, and a 3 cm segment excised in a similar fashion. Excellent hemostasis was noted, and the  tube returned to the abdomen.  The fascia was closed in a single layer using 0 Vicryl. The skin was closed in a subcuticular fashion using 4-0 vicryl, undyed. The closure was also closed with Dermabond.  The patient tolerated the procedure well. Sponge, lap, and needle counts were correct 2. The patient was taken to the recovery room in stable condition.   Sponge, lap, needle, and instrument counts were correct x 2.  VTE prophylaxis: SCDs. Antibiotic prophylaxis: none indicated. The patient tolerated the procedure well and was taken to the PACU in stable condition.   Thomasene MohairStephen Arohi Salvatierra, MD 02/29/2016 9:38 AM

## 2016-02-29 NOTE — Anesthesia Procedure Notes (Signed)
Procedure Name: Intubation Date/Time: 02/29/2016 8:50 AM Performed by: ZOXWRUEKILDUFF, Autumn Pruitt Pre-anesthesia Checklist: Patient identified, Emergency Drugs available, Suction available, Timeout performed and Patient being monitored Oxygen Delivery Method: Circle system utilized Preoxygenation: Pre-oxygenation with 100% oxygen Intubation Type: IV induction and Rapid sequence Grade View: Grade II Tube type: Oral Tube size: 7.0 mm Number of attempts: 1 Airway Equipment and Method: Stylet Secured at: 21 cm Tube secured with: Tape

## 2016-02-29 NOTE — H&P (Signed)
Obstetric Postpartum Daily Progress Note Subjective:  25 y.o. [id number] postpartum day #1 status post vaginal delivery.  She is ambulating, is tolerating po, is voiding spontaneously.  Her pain is well controlled on PO pain medications. Her lochia is less than menses.   Medications SCHEDULED MEDICATIONS  . famotidine  40 mg Oral Once  . ibuprofen  600 mg Oral Q6H  . metoCLOPramide  10 mg Oral Once  . prenatal multivitamin  1 tablet Oral Q1200  . senna-docusate  2 tablet Oral Q24H    MEDICATION INFUSIONS  . fentanyl 2.5 mcg/ml w/ropivacaine 0.2% in normal saline 100 mL EPIDURAL Infusion Stopped (02/28/16 0925)  . lactated ringers 20 mL/hr at 02/28/16 1805  . naLOXone (NARCAN) adult infusion for PRURITIS      PRN MEDICATIONS  acetaminophen, benzocaine-Menthol, coconut oil, witch hazel-glycerin **AND** dibucaine, diphenhydrAMINE **OR** diphenhydrAMINE, diphenhydrAMINE, nalbuphine **OR** nalbuphine, nalbuphine **OR** nalbuphine, naLOXone (NARCAN) adult infusion for PRURITIS, naloxone **AND** sodium chloride flush, ondansetron **OR** ondansetron (ZOFRAN) IV, oxyCODONE-acetaminophen, oxyCODONE-acetaminophen, simethicone    Objective:   Vitals:   02/28/16 1909 02/29/16 0020 02/29/16 0511 02/29/16 0802  BP: 138/74 122/71 (!) 98/59 123/86  Pulse: 90 82 78 68  Resp: 20 20 20 16   Temp: 98.3 F (36.8 C) 97.7 F (36.5 C) 97.8 F (36.6 C) 97.9 F (36.6 C)  TempSrc: Oral Oral Oral Oral  SpO2:    99%  Weight:      Height:        Current Vital Signs 24h Vital Sign Ranges  T 97.9 F (36.6 C) Temp  Avg: 98 F (36.7 C)  Min: 97.5 F (36.4 C)  Max: 98.5 F (36.9 C)  BP 123/86 BP  Min: 98/59  Max: 158/91  HR 68 Pulse  Avg: 89.9  Min: 56  Max: 258  RR 16 Resp  Avg: 18.4  Min: 16  Max: 20  SaO2 99 % Not Delivered SpO2  Avg: 99.5 %  Min: 99 %  Max: 100 %       24 Hour I/O Current Shift I/O  Time Ins Outs 12/15 0701 - 12/16 0700 In: 991.2 [I.V.:791.2] Out: 800 [Urine:800] No intake/output  data recorded.  General: NAD Pulmonary: no increased work of breathing Abdomen: non-distended, non-tender, fundus firm at level of umbilicus Extremities: no edema, no erythema, no tenderness  Labs:   Recent Labs Lab 02/28/16 0651 02/29/16 0554  WBC 11.6* 11.5*  HGB 10.1* 8.1*  HCT 31.0* 25.4*  PLT 200 189     Assessment:   25 y.o. [id number] postpartum day # 1 status post SVD.  Patient desires BTL for permanent sterility. I have reviewed the risks/benefits/alternatives.  She knows there are excellent non-permanent and that the risk of regret is high in her age group. She voiced understanding of failure risk and increased risk of ectopic pregnancy should pregnancy occur.  Will proceed with this informed consent Plan:   1) Acute blood loss anemia - hemodynamically stable and asymptomatic - po ferrous sulfate  2) A POS / Rubella  / Varicella Immune  3) TDAP status up to date  4) bottle /Contraception = bilateral tubal ligation (plan as above)  5) Disposition home today or tomorrow. Patient has been NPO since midnight.  Thomasene MohairStephen Sierria Bruney, MD 02/29/2016 8:27 AM

## 2016-02-29 NOTE — Progress Notes (Signed)
Mom is a smoker. Offered to call MD for nicotine patch this am. Mom refused saying she breaks out from the patch. Wanted to go outside and smoke. Told mom this is a tobacco free campus and no smoking is allowed on campus. After lunch, patient was tearful. Said she just needed to smoke and wanted to go across street and smoke. We discussed 1)she had just had surgery and could not go across street 2)it was a tobacco free campus and she could not smoke 3)2nd hand smoke was bad for the baby and increased risk of SIDS. At 1715, went in to check on mom after shower. She came out and room smelled of fresh cigarette smoke.  Repeated above conversation.

## 2016-02-29 NOTE — H&P (Signed)
History and Physical Interval Note:  Bailey Cabrera  has presented today for surgery, with the diagnosis of desire for permanent sterility  The various methods of treatment have been discussed with the patient and family. After consideration of risks, benefits and other options for treatment, the patient has consented to  Procedure(s): POST PARTUM TUBAL LIGATION (N/A) as a surgical intervention .  The patient's history has been reviewed, patient examined, no change in status, stable for surgery.  I have reviewed the patient's chart and labs.  Questions were answered to the patient's satisfaction.    Conard NovakJackson, Wilhelmine Krogstad D, MD 02/29/2016 8:30 AM

## 2016-02-29 NOTE — Anesthesia Postprocedure Evaluation (Signed)
Anesthesia Post Note  Patient: Bailey Cabrera  Procedure(s) Performed: Procedure(s) (LRB): POST PARTUM TUBAL LIGATION (N/A)  Patient location during evaluation: PACU Anesthesia Type: General Level of consciousness: awake and alert Pain management: pain level controlled Vital Signs Assessment: post-procedure vital signs reviewed and stable Respiratory status: spontaneous breathing, nonlabored ventilation, respiratory function stable and patient connected to nasal cannula oxygen Cardiovascular status: blood pressure returned to baseline and stable Postop Assessment: no signs of nausea or vomiting Anesthetic complications: no    Last Vitals:  Vitals:   02/29/16 1006 02/29/16 1018  BP:  (!) 142/91  Pulse: 92 76  Resp: 15 16  Temp: 36.6 C 36.4 C    Last Pain:  Vitals:   02/29/16 1018  TempSrc: Oral  PainSc:                  Cleda MccreedyJoseph K Rolanda Campa

## 2016-02-29 NOTE — Transfer of Care (Signed)
Immediate Anesthesia Transfer of Care Note  Patient: Bailey Cabrera  Procedure(s) Performed: Procedure(s): POST PARTUM TUBAL LIGATION (N/A)  Patient Location: PACU  Anesthesia Type:General  Level of Consciousness: awake, alert  and oriented  Airway & Oxygen Therapy: Patient Spontanous Breathing  Post-op Assessment: Report given to RN  Post vital signs: Reviewed and stable  Last Vitals:  Vitals:   02/29/16 0802 02/29/16 0938  BP: 123/86 119/87  Pulse: 68 79  Resp: 16 15  Temp: 36.6 C (P) 37.1 C    Last Pain:  Vitals:   02/29/16 0802  TempSrc: Oral  PainSc:          Complications: No apparent anesthesia complications

## 2016-02-29 NOTE — Anesthesia Preprocedure Evaluation (Signed)
Anesthesia Evaluation  Patient identified by MRN, date of birth, ID band Patient awake    Reviewed: Allergy & Precautions, H&P , NPO status , Patient's Chart, lab work & pertinent test results  History of Anesthesia Complications Negative for: history of anesthetic complications  Airway Mallampati: III  TM Distance: <3 FB Neck ROM: full    Dental no notable dental hx. (+) Poor Dentition, Chipped   Pulmonary neg shortness of breath, asthma , Current Smoker,    Pulmonary exam normal breath sounds clear to auscultation       Cardiovascular (-) angina(-) Past MI and (-) DOE negative cardio ROS Normal cardiovascular exam Rhythm:regular Rate:Normal     Neuro/Psych Seizures -, Well Controlled,  PSYCHIATRIC DISORDERS Anxiety Depression Bipolar Disorder negative psych ROS   GI/Hepatic negative GI ROS, Neg liver ROS,   Endo/Other  negative endocrine ROS  Renal/GU Renal disease     Musculoskeletal   Abdominal   Peds  Hematology negative hematology ROS (+)   Anesthesia Other Findings Past Medical History: No date: Anemia No date: Asthma 07/20/2014: Herpes genitalis     Comment: Has been tx'd since 32weeks for suppresion No date: Hypotension No date: Seizures (HCC)     Comment: During pregnancy; most recent April 2016  Past Surgical History: 2012: sonohystogram 2011: WISDOM TOOTH EXTRACTION Bilateral     Comment: Post op infection  BMI    Body Mass Index:  30.23 kg/m      Reproductive/Obstetrics negative OB ROS                             Anesthesia Physical Anesthesia Plan  ASA: III  Anesthesia Plan: General ETT   Post-op Pain Management:    Induction:   Airway Management Planned:   Additional Equipment:   Intra-op Plan:   Post-operative Plan:   Informed Consent: I have reviewed the patients History and Physical, chart, labs and discussed the procedure including the risks,  benefits and alternatives for the proposed anesthesia with the patient or authorized representative who has indicated his/her understanding and acceptance.   Dental Advisory Given  Plan Discussed with: Anesthesiologist, CRNA and Surgeon  Anesthesia Plan Comments:         Anesthesia Quick Evaluation

## 2016-03-01 MED ORDER — IBUPROFEN 600 MG PO TABS: 600.0000 mg | ORAL_TABLET | Freq: Four times a day (QID) | ORAL | 0 refills | Status: DC

## 2016-03-01 MED ORDER — OXYCODONE-ACETAMINOPHEN 5-325 MG PO TABS: 2.0000 | ORAL_TABLET | ORAL | 0 refills | Status: DC | PRN

## 2016-03-01 NOTE — Clinical Social Work Maternal (Signed)
  CLINICAL SOCIAL WORK MATERNAL/CHILD NOTE  Patient Details  Name: Bailey Cabrera MRN: [id number] Date of Birth: 15-Jul-1990  Date:  03/01/2016  Clinical Social Worker Initiating Note:  Argentina PonderKaren Martha Yosmar Ryker, MSW, LCSW-A Date/ Time Initiated:  03/01/16/1131     Child's Name:  Not given   Legal Guardian:  Mother   Need for Interpreter:  None   Date of Referral:  02/29/16     Reason for Referral:  Current Substance Use/Substance Use During Pregnancy    Referral Source:  RN   Address:  [address], [place], [place], [place] [address]  Phone number:  [phone]   Household Members:  Minor Children, Spouse   Natural Supports (not living in the home):  Friends, Immediate Family   Professional Supports: Case Research officer, political partyManager/Social Worker (Social worker: M. Christ[facility]le [facility])   Employment: Unemployed   Type of Work: None   Education:  Associate ProfessorHigh school graduate   Financial Resources:  [employer]   Other Resources:  Sales executiveood Stamps , [facility]   Cultural/Religious Considerations Which May Impact Care:  None mentioned  Strengths:      Risk Factors/Current Problems:  Substance Use    Cognitive State:  Alert , Linear Thinking , Insightful    Mood/Affect:  Calm    CSW Assessment: The CSW visited the patient at bedside to discuss positive tox screen for cannabis. The FOB was present but stepped out of the room after voicing his disagreement with being asked to leave. Due to HIPAA regulations for patients with positive substance use, the interview should be in private regardless of the relationship. The patient is married to the FOB, and they have 2 other children ages 817 and 1. The patient reports that she uses cannabis due to morning sickness and that she has [place] regular [place] [facility], [place]. The patient indicates that she is prepared for the baby to return home, and she understands that the CSW will have to file a CPS report due to the cannabis use. The CSW provided a resource list for the  community and gave education concerning post-partum depression/anxiety. The patient thanked the CSW for assistance. CSW has contacted CPS to update.  CSW Plan/Description:  Child Protective Service Report , Patient/Family Education     Judi CongKaren M Murriel Eidem, LCSW 03/01/2016, 11:33 AM

## 2016-03-01 NOTE — Clinical Social Work Note (Signed)
CSW received consult about positive drug screen. CSW plans to see today as yesterday the patient had surgery. CSW following.  Argentina PonderKaren Martha Meris Reede, MSW, LCSW-A [phone]

## 2016-03-01 NOTE — Progress Notes (Signed)
Patient discharged home with infant and significant other. Discharge instructions, prescriptions and follow up appointment given to and reviewed with patient and significant other. Patient verbalized understanding. Escorted out via wheelchair by RN.

## 2016-03-02 ENCOUNTER — Encounter: Payer: Self-pay | Admitting: Obstetrics and Gynecology

## 2016-03-02 NOTE — Anesthesia Postprocedure Evaluation (Signed)
Anesthesia Post Note  Patient: Bailey Cabrera  Procedure(s) Performed: * No procedures listed *  Patient location during evaluation: Mother Baby Anesthesia Type: Epidural Level of consciousness: awake and alert Pain management: pain level controlled Vital Signs Assessment: post-procedure vital signs reviewed and stable Respiratory status: spontaneous breathing, nonlabored ventilation and respiratory function stable Cardiovascular status: stable Postop Assessment: no headache, no backache and epidural receding Anesthetic complications: no     Last Vitals: There were no vitals filed for this visit.  Last Pain: There were no vitals filed for this visit.               Caralina Nop

## 2016-03-03 LAB — SURGICAL PATHOLOGY

## 2016-05-19 ENCOUNTER — Ambulatory Visit: Payer: Self-pay | Admitting: Obstetrics and Gynecology

## 2016-06-16 ENCOUNTER — Ambulatory Visit: Payer: Self-pay | Admitting: Obstetrics and Gynecology

## 2016-07-22 ENCOUNTER — Emergency Department
Admission: EM | Admit: 2016-07-22 | Discharge: 2016-07-22 | Disposition: A | Discharge status: Home / Self Care | Payer: Medicaid Other | Source: Home / Self Care | Attending: Emergency Medicine | Admitting: Emergency Medicine

## 2016-07-22 ENCOUNTER — Encounter: Payer: Self-pay | Admitting: Emergency Medicine

## 2016-07-22 ENCOUNTER — Emergency Department: Payer: Medicaid Other

## 2016-07-22 ENCOUNTER — Emergency Department
Admission: EM | Admit: 2016-07-22 | Discharge: 2016-07-22 | Disposition: A | Discharge status: Home / Self Care | Payer: Medicaid Other | Attending: Emergency Medicine | Admitting: Emergency Medicine

## 2016-07-22 ENCOUNTER — Encounter: Payer: Self-pay | Admitting: *Deleted

## 2016-07-22 DIAGNOSIS — S20222A Contusion of left back wall of thorax, initial encounter: Secondary | ICD-10-CM

## 2016-07-22 DIAGNOSIS — Y929 Unspecified place or not applicable: Secondary | ICD-10-CM | POA: Insufficient documentation

## 2016-07-22 DIAGNOSIS — F17210 Nicotine dependence, cigarettes, uncomplicated: Secondary | ICD-10-CM | POA: Diagnosis not present

## 2016-07-22 DIAGNOSIS — Y9301 Activity, walking, marching and hiking: Secondary | ICD-10-CM | POA: Diagnosis not present

## 2016-07-22 DIAGNOSIS — R197 Diarrhea, unspecified: Secondary | ICD-10-CM

## 2016-07-22 DIAGNOSIS — Y999 Unspecified external cause status: Secondary | ICD-10-CM | POA: Diagnosis not present

## 2016-07-22 DIAGNOSIS — W109XXA Fall (on) (from) unspecified stairs and steps, initial encounter: Secondary | ICD-10-CM | POA: Diagnosis not present

## 2016-07-22 DIAGNOSIS — M545 Low back pain, unspecified: Secondary | ICD-10-CM

## 2016-07-22 DIAGNOSIS — N739 Female pelvic inflammatory disease, unspecified: Secondary | ICD-10-CM | POA: Insufficient documentation

## 2016-07-22 DIAGNOSIS — S299XXA Unspecified injury of thorax, initial encounter: Secondary | ICD-10-CM | POA: Diagnosis present

## 2016-07-22 DIAGNOSIS — R1110 Vomiting, unspecified: Secondary | ICD-10-CM

## 2016-07-22 DIAGNOSIS — R1084 Generalized abdominal pain: Secondary | ICD-10-CM

## 2016-07-22 DIAGNOSIS — J45909 Unspecified asthma, uncomplicated: Secondary | ICD-10-CM | POA: Diagnosis not present

## 2016-07-22 DIAGNOSIS — Z791 Long term (current) use of non-steroidal anti-inflammatories (NSAID): Secondary | ICD-10-CM | POA: Diagnosis not present

## 2016-07-22 LAB — URINE DRUG SCREEN, QUALITATIVE (ARMC ONLY)
AMPHETAMINES, UR SCREEN: NOT DETECTED
BENZODIAZEPINE, UR SCRN: NOT DETECTED
Barbiturates, Ur Screen: NOT DETECTED
CANNABINOID 50 NG, UR ~~LOC~~: POSITIVE — AB
Cocaine Metabolite,Ur ~~LOC~~: NOT DETECTED
MDMA (Ecstasy)Ur Screen: NOT DETECTED
Methadone Scn, Ur: NOT DETECTED
OPIATE, UR SCREEN: POSITIVE — AB
PHENCYCLIDINE (PCP) UR S: NOT DETECTED
Tricyclic, Ur Screen: NOT DETECTED

## 2016-07-22 LAB — COMPREHENSIVE METABOLIC PANEL
ALT: 19 U/L (ref 14–54)
AST: 39 U/L (ref 15–41)
Albumin: 4.7 g/dL (ref 3.5–5.0)
Alkaline Phosphatase: 90 U/L (ref 38–126)
Anion gap: 12 (ref 5–15)
BILIRUBIN TOTAL: 0.6 mg/dL (ref 0.3–1.2)
BUN: 13 mg/dL (ref 6–20)
CHLORIDE: 109 mmol/L (ref 101–111)
CO2: 21 mmol/L — ABNORMAL LOW (ref 22–32)
CREATININE: 0.65 mg/dL (ref 0.44–1.00)
Calcium: 9.6 mg/dL (ref 8.9–10.3)
Glucose, Bld: 178 mg/dL — ABNORMAL HIGH (ref 65–99)
POTASSIUM: 3.6 mmol/L (ref 3.5–5.1)
Sodium: 142 mmol/L (ref 135–145)
TOTAL PROTEIN: 8.1 g/dL (ref 6.5–8.1)

## 2016-07-22 LAB — CBC
HEMATOCRIT: 38.2 % (ref 35.0–47.0)
Hemoglobin: 12.9 g/dL (ref 12.0–16.0)
MCH: 29.2 pg (ref 26.0–34.0)
MCHC: 33.7 g/dL (ref 32.0–36.0)
MCV: 86.5 fL (ref 80.0–100.0)
PLATELETS: 315 10*3/uL (ref 150–440)
RBC: 4.42 MIL/uL (ref 3.80–5.20)
RDW: 16.1 % — AB (ref 11.5–14.5)
WBC: 11.1 10*3/uL — AB (ref 3.6–11.0)

## 2016-07-22 LAB — POCT PREGNANCY, URINE: PREG TEST UR: NEGATIVE

## 2016-07-22 LAB — LIPASE, BLOOD: LIPASE: 18 U/L (ref 11–51)

## 2016-07-22 MED ORDER — CEFTRIAXONE SODIUM 250 MG IJ SOLR
INTRAMUSCULAR | Status: AC
  Administered 2016-07-22: 250 mg via INTRAMUSCULAR
  Filled 2016-07-22: qty 250

## 2016-07-22 MED ORDER — IOPAMIDOL (ISOVUE-300) INJECTION 61%
100.0000 mL | Freq: Once | INTRAVENOUS | Status: AC | PRN
  Administered 2016-07-22: 100 mL via INTRAVENOUS

## 2016-07-22 MED ORDER — DOXYCYCLINE HYCLATE 100 MG PO TABS: 100.0000 mg | ORAL_TABLET | Freq: Two times a day (BID) | ORAL | 0 refills | Status: DC

## 2016-07-22 MED ORDER — PROMETHAZINE HCL 25 MG/ML IJ SOLN
INTRAMUSCULAR | Status: AC
  Administered 2016-07-22: 25 mg via INTRAVENOUS
  Filled 2016-07-22: qty 1

## 2016-07-22 MED ORDER — KETOROLAC TROMETHAMINE 30 MG/ML IJ SOLN
30.0000 mg | Freq: Once | INTRAMUSCULAR | Status: AC
  Administered 2016-07-22: 30 mg via INTRAVENOUS

## 2016-07-22 MED ORDER — IOPAMIDOL (ISOVUE-300) INJECTION 61%
30.0000 mL | Freq: Once | INTRAVENOUS | Status: AC | PRN
  Administered 2016-07-22: 30 mL via ORAL

## 2016-07-22 MED ORDER — NAPROXEN 500 MG PO TABS
500.0000 mg | ORAL_TABLET | Freq: Once | ORAL | Status: DC
  Filled 2016-07-22: qty 1

## 2016-07-22 MED ORDER — NAPROXEN 500 MG PO TABS: 500.0000 mg | ORAL_TABLET | Freq: Two times a day (BID) | ORAL | 1 refills | Status: DC

## 2016-07-22 MED ORDER — CEFTRIAXONE SODIUM IN DEXTROSE 20 MG/ML IV SOLN
INTRAVENOUS | Status: AC
  Filled 2016-07-22: qty 50

## 2016-07-22 MED ORDER — CEFTRIAXONE SODIUM 250 MG IJ SOLR
250.0000 mg | Freq: Once | INTRAMUSCULAR | Status: AC
  Administered 2016-07-22: 250 mg via INTRAMUSCULAR

## 2016-07-22 MED ORDER — PROMETHAZINE HCL 25 MG/ML IJ SOLN
25.0000 mg | Freq: Once | INTRAMUSCULAR | Status: AC
  Administered 2016-07-22: 25 mg via INTRAVENOUS

## 2016-07-22 MED ORDER — ONDANSETRON 4 MG PO TBDP: 4.0000 mg | ORAL_TABLET | Freq: Three times a day (TID) | ORAL | 0 refills | Status: DC | PRN

## 2016-07-22 MED ORDER — METOCLOPRAMIDE HCL 5 MG/ML IJ SOLN
INTRAMUSCULAR | Status: AC
  Filled 2016-07-22: qty 2

## 2016-07-22 MED ORDER — HYDROCODONE-ACETAMINOPHEN 5-325 MG PO TABS: 1.0000 | ORAL_TABLET | ORAL | 0 refills | Status: DC | PRN

## 2016-07-22 MED ORDER — ONDANSETRON HCL 4 MG/2ML IJ SOLN
4.0000 mg | Freq: Once | INTRAMUSCULAR | Status: AC
Start: 2016-07-22 — End: 2016-07-22
  Administered 2016-07-22: 4 mg via INTRAVENOUS

## 2016-07-22 MED ORDER — HYDROCODONE-ACETAMINOPHEN 5-325 MG PO TABS
1.0000 | ORAL_TABLET | Freq: Once | ORAL | Status: AC
  Administered 2016-07-22: 1 via ORAL
  Filled 2016-07-22: qty 1

## 2016-07-22 MED ORDER — KETOROLAC TROMETHAMINE 30 MG/ML IJ SOLN: 30.0000 mg | Freq: Once | INTRAMUSCULAR | Status: DC

## 2016-07-22 MED ORDER — ONDANSETRON HCL 4 MG/2ML IJ SOLN
INTRAMUSCULAR | Status: AC
  Filled 2016-07-22: qty 2

## 2016-07-22 MED ORDER — METOCLOPRAMIDE HCL 5 MG/ML IJ SOLN
10.0000 mg | Freq: Once | INTRAMUSCULAR | Status: AC
  Administered 2016-07-22: 10 mg via INTRAVENOUS

## 2016-07-22 MED ORDER — KETOROLAC TROMETHAMINE 30 MG/ML IJ SOLN
INTRAMUSCULAR | Status: AC
  Filled 2016-07-22: qty 1

## 2016-07-22 MED ORDER — DOXYCYCLINE HYCLATE 100 MG PO TABS
100.0000 mg | ORAL_TABLET | Freq: Once | ORAL | Status: AC
  Administered 2016-07-22: 100 mg via ORAL
  Filled 2016-07-22: qty 1

## 2016-07-22 MED ORDER — DEXTROSE 5 % IV SOLN
250.0000 mg | Freq: Once | INTRAVENOUS | Status: DC
  Filled 2016-07-22: qty 250

## 2016-07-22 MED ORDER — CYCLOBENZAPRINE HCL 5 MG PO TABS: 5.0000 mg | ORAL_TABLET | Freq: Three times a day (TID) | ORAL | 0 refills | Status: DC | PRN

## 2016-07-22 MED ORDER — SODIUM CHLORIDE 0.9 % IV BOLUS (SEPSIS)
1000.0000 mL | Freq: Once | INTRAVENOUS | Status: AC
  Administered 2016-07-22: 1000 mL via INTRAVENOUS

## 2016-07-22 NOTE — ED Triage Notes (Signed)
Pt to ed with c/o fall about 2 days ago down 5 steps at home while carrying her baby in a carrier.  Pt now states pain in back severe worse with movement and activity. Pt ambulates well.

## 2016-07-22 NOTE — ED Provider Notes (Signed)
[facility] Emergency Department Provider Note   ____________________________________________   I have reviewed the triage vital signs and the nursing notes.   HISTORY  Chief Complaint Fall    HPI Bailey Cabrera is a 26 y.o. female presents with lumbar back pain left thigh pain secondary to fall down 5 steps 2 days ago. Patient was carrying her 65-month-old in the car seat carrier and walking with her 81-year-old when she lost her footing and landed on her left side of her back and left leg. Patient denies loss of consciousness and was ambulatory immediately following the fall.Patient denies left lower extremity radicular symptoms. She denies past history of back injury. Patient denies fever, chills, headache, vision changes, chest pain, chest tightness, shortness of breath, abdominal pain, nausea and vomiting.  Past Medical History:  Diagnosis Date  . Anemia   . Asthma   . Herpes genitalis 07/20/2014   Has been tx'd since 32weeks for suppresion  . Hypotension   . Seizures (HCC)    During pregnancy; most recent April 2016    Patient Active Problem List   Diagnosis Date Noted  . Uterine contractions during pregnancy 02/26/2016  . Lower abdominal pain 02/03/2016  . Indication for care in labor or delivery 01/27/2016  . Abdominal pain affecting pregnancy 12/19/2015  . Hyperemesis affecting pregnancy, antepartum 08/23/2015  . Labor and delivery indication for care or intervention 07/20/2014  . Marijuana use 07/20/2014  . 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 Anxiety 07/20/2014  . Depression 07/20/2014  . Bipolar 1 disorder (HCC) 07/20/2014  . Fetal hydronephrosis during pregnancy, antepartum 07/20/2014  . Labor and delivery, indication for care 07/20/2014    Past Surgical History:  Procedure Laterality Date  . sonohystogram  2012  . TUBAL LIGATION N/A 02/29/2016   Procedure: POST PARTUM TUBAL LIGATION;  Surgeon: Conard Novak, MD;  Location:  [facility] ORS;  Service: Gynecology;  Laterality: N/A;  . WISDOM TOOTH EXTRACTION Bilateral 2011   Post op infection    Prior to Admission medications   Medication Sig Start Date End Date Taking? Authorizing Provider  ibuprofen (ADVIL,MOTRIN) 600 MG tablet Take 1 tablet (600 mg total) by mouth every 6 (six) hours. 03/01/16   Conard Novak, MD  oxyCODONE-acetaminophen (PERCOCET/ROXICET) 5-325 MG tablet Take 2 tablets by mouth every 4 (four) hours as needed (pain scale > 7). 03/01/16   Conard Novak, MD  pantoprazole (PROTONIX) 40 MG tablet Take 40 mg by mouth daily.    [provider]  Prenatal Vit-Fe Fumarate-FA (MULTIVITAMIN-PRENATAL) 27-0.8 MG TABS tablet Take 1 tablet by mouth daily at 12 noon.    [provider]    Allergies Penicillins  History reviewed. No pertinent family history.  Social History Social History  Substance Use Topics  . Smoking status: Current Every Day Smoker    Packs/day: 0.50    Years: 5.00    Types: Cigarettes  . Smokeless tobacco: Never Used  . Alcohol use No    Review of Systems Constitutional: Negative / Positive for fever/chills Eyes: No visual changes. ENT: Negative / Positive for sore throat. Negative/Positive for difficulty swallowing Cardiovascular: Denies chest pain. Respiratory: Denies cough Denies shortness of breath. Gastrointestinal: No abdominal pain.  No nausea, no vomiting.   No diarrhea. Genitourinary: Negative  for dysuria. Musculoskeletal: Positive for back pain.  Skin: Negative / Postive for rash. Neurological: Negative for headaches. Negative for focal weakness or numbness. Negative for loss of consciousness. ____________________________________________   PHYSICAL EXAM:  VITAL  SIGNS: ED Triage Vitals [07/22/16 1137]  Enc Vitals Group     BP 112/75     Pulse Rate 76     Resp 18     Temp 97.2 F (36.2 C)     Temp Source Oral     SpO2 100 %     Weight 131 lb (59.4 kg)     Height 5\' 1"  (1.549 m)      Head Circumference      Peak Flow      Pain Score 9     Pain Loc      Pain Edu?      Excl. in GC?     Constitutional: Alert and oriented. Well appearing and in no acute distress.  Head: Normocephalic and atraumatic. Eyes: Conjunctivae are normal.  Nose:  congestion Cardiovascular: Normal rate, regular rhythm. Normal distal pulses. Respiratory: Normal respiratory effort. . Lungs CTAB . Gastrointestinal: Soft and nontender. No distention. Musculoskeletal: Nontender with normal range of motion in all extremities. FROM x 4 extremities. Lumbar spinous processes tender to palpation L1-4. Neurologic: Normal speech and language. No gross focal neurologic deficits are appreciated. No gait instability. Sensorimotor: No sensory loss, Intact lower extremity DTRs Psychiatric: Mood and affect are normal. ____________________________________________   LABS (all labs ordered are listed, but only abnormal results are displayed)  Labs Reviewed - No data to display ____________________________________________  EKG None ____________________________________________  RADIOLOGY Ordered not completed    ____________________________________________   PROCEDURES  Procedure(s) performed: No    Critical Care performed: no ____________________________________________   INITIAL IMPRESSION / ASSESSMENT AND PLAN / ED COURSE  Pertinent labs & imaging results that were available during my care of the patient were reviewed by me and considered in my medical decision making (see chart for details).   Patient eloped for unknown reasons after being seen. Patient was awaiting x-rays at the time. Patient did have decision making faculties.       ____________________________________________   FINAL CLINICAL IMPRESSION(S) / ED DIAGNOSES  Final diagnoses:  Acute left-sided low back pain without sciatica  Contusion of left back wall of thorax, initial encounter     NEW MEDICATIONS  STARTED DURING THIS VISIT:  Discharge Medication List as of 07/22/2016  1:46 PM       Note:  This document was prepared using Dragon voice recognition software and may include unintentional dictation errors.   Clois ComberLittle, Traci M, PA-C 07/22/16 1537    Jene EveryKinner, Robert, MD 07/22/16 [phone]

## 2016-07-22 NOTE — ED Triage Notes (Signed)
Pt brought in via ems from home with abd pain and back pain.  Pt was in the er this morning but was not seen by a provider.  Pt returns with n/v/d.  Pt moaning and yelling out in triage.  Pt  Taken to room 14 for triage.  Iv in place .

## 2016-07-22 NOTE — ED Notes (Addendum)
Pt reports she is having lower back pain that started 2 days ago after a fall down steps - provider has already assessed pt - pt refuses to do pregnancy test and will sign a waiver

## 2016-07-22 NOTE — ED Notes (Signed)
Pts bedding changed and given 2 warm blankets

## 2016-07-22 NOTE — ED Notes (Signed)
Xray came to get pt x2 and neither time was pt in the room - pt appears to have left AMA at this point

## 2016-07-22 NOTE — ED Provider Notes (Addendum)
[facility] Emergency Department Provider Note  Time seen: 4:25 PM  I have reviewed the triage vital signs and the nursing notes.   HISTORY  Chief Complaint Abdominal Pain and Back Pain    HPI Bailey Cabrera is a 26 y.o. female with a past medical history of anemia, asthma, anxiety, bipolar, presents to the emergency department for abdominal pain nausea vomiting and diarrhea. According to the patient she was seen here earlier today due to back pain and leg pain from a fall 2 days ago. Patient now states she has abdominal pain. When asked when the pain started she states she doesn't know and then she says today at some time. Patient also states she has been vomiting with diarrhea which she states started today as well. She states when she came earlier today she was not having any abdominal pain vomiting or diarrhea. Patient was seen by a physician assistant earlier today however eloped shortly after being seen, before a workup could be completed. Patient states she is not having pain in her legs anymore continues to have some pain in her lower back but now states all the pain is in her abdomen. Cannot describe an area of pain in the abdomen says it hurts everywhere which she describes as moderate to severe.Denies any fever, black or bloody stool, dysuria, vaginal bleeding or discharge. Patient delivered her child 4 months ago, but denies any issues since.  Past Medical History:  Diagnosis Date  . Anemia   . Asthma   . Herpes genitalis 07/20/2014   Has been tx'd since 32weeks for suppresion  . Hypotension   . Seizures (HCC)    During pregnancy; most recent April 2016    Patient Active Problem List   Diagnosis Date Noted  . Uterine contractions during pregnancy 02/26/2016  . Lower abdominal pain 02/03/2016  . Indication for care in labor or delivery 01/27/2016  . Abdominal pain affecting pregnancy 12/19/2015  . Hyperemesis affecting pregnancy, antepartum  08/23/2015  . Labor and delivery indication for care or intervention 07/20/2014  . Marijuana use 07/20/2014  . 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 Anxiety 07/20/2014  . Depression 07/20/2014  . Bipolar 1 disorder (HCC) 07/20/2014  . Fetal hydronephrosis during pregnancy, antepartum 07/20/2014  . Labor and delivery, indication for care 07/20/2014    Past Surgical History:  Procedure Laterality Date  . sonohystogram  2012  . TUBAL LIGATION N/A 02/29/2016   Procedure: POST PARTUM TUBAL LIGATION;  Surgeon: Conard Novak, MD;  Location: [facility] ORS;  Service: Gynecology;  Laterality: N/A;  . WISDOM TOOTH EXTRACTION Bilateral 2011   Post op infection    Prior to Admission medications   Medication Sig Start Date End Date Taking? Authorizing Provider  ibuprofen (ADVIL,MOTRIN) 600 MG tablet Take 1 tablet (600 mg total) by mouth every 6 (six) hours. 03/01/16   Conard Novak, MD  oxyCODONE-acetaminophen (PERCOCET/ROXICET) 5-325 MG tablet Take 2 tablets by mouth every 4 (four) hours as needed (pain scale > 7). 03/01/16   Conard Novak, MD  pantoprazole (PROTONIX) 40 MG tablet Take 40 mg by mouth daily.    [provider]  Prenatal Vit-Fe Fumarate-FA (MULTIVITAMIN-PRENATAL) 27-0.8 MG TABS tablet Take 1 tablet by mouth daily at 12 noon.    [provider]    Allergies  Allergen Reactions  . Penicillins Hives and Swelling    Throat swells.    No family history on file.  Social History Social History  Substance Use Topics  .  Smoking status: Current Every Day Smoker    Packs/day: 0.50    Years: 5.00    Types: Cigarettes  . Smokeless tobacco: Never Used  . Alcohol use No    Review of Systems Constitutional: Negative for fever. ENT: Negative for congestion Cardiovascular: Negative for chest pain. Respiratory: Negative for shortness of breath. Gastrointestinal: Moderate diffuse abdominal pain. Positive for nausea vomiting diarrhea. Genitourinary: Negative for  dysuria. Musculoskeletal: Moderate lower back pain Skin: Negative for rash. Neurological: Negative for headache All other ROS negative  ____________________________________________   PHYSICAL EXAM:  VITAL SIGNS: ED Triage Vitals  Enc Vitals Group     BP 07/22/16 1550 121/75     Pulse Rate 07/22/16 1550 (!) 55     Resp 07/22/16 1550 20     Temp --      Temp src --      SpO2 07/22/16 1550 99 %     Weight 07/22/16 1548 131 lb (59.4 kg)     Height 07/22/16 1548 5\' 1"  (1.549 m)     Head Circumference --      Peak Flow --      Pain Score 07/22/16 1546 10     Pain Loc --      Pain Edu? --      Excl. in GC? --     Constitutional: Alert and oriented. Mild distress due to pain, asking for pain medication. Eyes: Normal exam ENT   Head: Normocephalic and atraumatic.   Mouth/Throat: Mucous membranes are moist. Cardiovascular: Normal rate, regular rhythm. No murmur Respiratory: Normal respiratory effort without tachypnea nor retractions. Breath sounds are clear  Gastrointestinal: Moderate diffuse abdominal tenderness palpation without rebound or guarding. No distention. Musculoskeletal: Nontender with normal range of motion in all extremities Neurologic:  Normal speech and language. No gross focal neurologic deficits Skin:  Skin is warm, dry and intact.  Psychiatric: Mood and affect are normal.   ____________________________________________     RADIOLOGY  IMPRESSION: 1. Suggestion of mild diffuse colonic wall thickening, can be seen with colitis in the setting of diarrhea. 2. Questionable deep pelvic soft tissue inflammation about the lower uterine segment and cervix, recommend correlation for pelvic inflammatory disease. This may alternatively be secondary to primary colonic process.  ____________________________________________   INITIAL IMPRESSION / ASSESSMENT AND PLAN / ED COURSE  Pertinent labs & imaging results that were available during my care of the  patient were reviewed by me and considered in my medical decision making (see chart for details).  *The patient presents to the emergency department for abdominal pain. Patient is a very poor historian and cannot tell me when the abdominal pain started besides some point today. She also states she has been vomiting with diarrhea which she also states started at some point today. Patient has moderate diffuse abdominal tenderness although denies any focal tenderness. No rebound or guarding, no distention. Patient was seen in the emergency department at 11:00 this morning at that time her complaint was a fall 2 days ago causing her to have lower back pain and thigh pain. There is no mention of abdominal pain nausea vomiting or diarrhea during that visit. Patient states her symptoms started after that (which would have been within the last 4 hours). Patient is in mild distress due to abdominal pain. We will check labs, IV hydrate, treat with Zofran as well as IV Toradol.  CT shows mild diffuse colonic wall thickening which could be seeing colitis. Patient's boyfriend is here states he also has  similar symptoms of nausea and diarrhea. I suspect the degree of gastroenteritis. CT is also showing possible pelvic soft tissue inflammation around the cervix. I performed a pelvic examination which did show moderate cervical motion tenderness but no discernible discharge. Patient does states she has noticed some mild discharge to home. We'll place the patient on doxycycline, Norco and Zofran. I discussed with the patient given her ongoing nausea and pain admission to the hospital, patient would much prefer a trial of going home with medications. Patient states she does not breast-feed.  ____________________________________________   FINAL CLINICAL IMPRESSION(S) / ED DIAGNOSES  Abdominal pain Nausea vomiting diarrhea Pelvic inflammatory disease   Minna AntisPaduchowski, Nafis Farnan, MD 07/22/16 2010    Minna AntisPaduchowski, Magda Muise,  MD 07/22/16 2013

## 2016-07-22 NOTE — ED Notes (Signed)
Pt given warm blanket.

## 2016-07-22 NOTE — ED Notes (Signed)
Patient transported to CT 

## 2016-07-24 ENCOUNTER — Emergency Department
Admission: EM | Admit: 2016-07-24 | Discharge: 2016-07-24 | Disposition: A | Discharge status: Home / Self Care | Payer: Medicaid Other | Attending: Emergency Medicine | Admitting: Emergency Medicine

## 2016-07-24 DIAGNOSIS — J45909 Unspecified asthma, uncomplicated: Secondary | ICD-10-CM | POA: Diagnosis not present

## 2016-07-24 DIAGNOSIS — R1084 Generalized abdominal pain: Secondary | ICD-10-CM | POA: Diagnosis not present

## 2016-07-24 DIAGNOSIS — F17210 Nicotine dependence, cigarettes, uncomplicated: Secondary | ICD-10-CM | POA: Diagnosis not present

## 2016-07-24 DIAGNOSIS — Z79899 Other long term (current) drug therapy: Secondary | ICD-10-CM | POA: Diagnosis not present

## 2016-07-24 DIAGNOSIS — R112 Nausea with vomiting, unspecified: Secondary | ICD-10-CM | POA: Insufficient documentation

## 2016-07-24 LAB — CBC WITH DIFFERENTIAL/PLATELET
BASOS ABS: 0 10*3/uL (ref 0–0.1)
Basophils Relative: 0 %
EOS ABS: 0 10*3/uL (ref 0–0.7)
Eosinophils Relative: 0 %
HCT: 35.1 % (ref 35.0–47.0)
HEMOGLOBIN: 12 g/dL (ref 12.0–16.0)
LYMPHS ABS: 1.3 10*3/uL (ref 1.0–3.6)
Lymphocytes Relative: 16 %
MCH: 29.5 pg (ref 26.0–34.0)
MCHC: 34.3 g/dL (ref 32.0–36.0)
MCV: 86.1 fL (ref 80.0–100.0)
Monocytes Absolute: 0.5 10*3/uL (ref 0.2–0.9)
Monocytes Relative: 6 %
NEUTROS PCT: 78 %
Neutro Abs: 6.5 10*3/uL (ref 1.4–6.5)
Platelets: 247 10*3/uL (ref 150–440)
RBC: 4.08 MIL/uL (ref 3.80–5.20)
RDW: 15.9 % — ABNORMAL HIGH (ref 11.5–14.5)
WBC: 8.3 10*3/uL (ref 3.6–11.0)

## 2016-07-24 LAB — COMPREHENSIVE METABOLIC PANEL
ALBUMIN: 4.6 g/dL (ref 3.5–5.0)
ALK PHOS: 75 U/L (ref 38–126)
ALT: 18 U/L (ref 14–54)
AST: 22 U/L (ref 15–41)
Anion gap: 9 (ref 5–15)
BUN: 14 mg/dL (ref 6–20)
CALCIUM: 9 mg/dL (ref 8.9–10.3)
CHLORIDE: 107 mmol/L (ref 101–111)
CO2: 27 mmol/L (ref 22–32)
CREATININE: 0.51 mg/dL (ref 0.44–1.00)
GFR calc Af Amer: >60 mL/min (ref >60)
GFR calc non Af Amer: >60 mL/min (ref >60)
GLUCOSE: 123 mg/dL — AB (ref 65–99)
Potassium: 2.9 mmol/L — ABNORMAL LOW (ref 3.5–5.1)
SODIUM: 143 mmol/L (ref 135–145)
Total Bilirubin: 0.5 mg/dL (ref 0.3–1.2)
Total Protein: 7.9 g/dL (ref 6.5–8.1)

## 2016-07-24 LAB — LIPASE, BLOOD: Lipase: 21 U/L (ref 11–51)

## 2016-07-24 MED ORDER — PROMETHAZINE HCL 25 MG/ML IJ SOLN
12.5000 mg | Freq: Once | INTRAMUSCULAR | Status: AC
  Administered 2016-07-24: 12.5 mg via INTRAVENOUS
  Filled 2016-07-24: qty 1

## 2016-07-24 MED ORDER — POTASSIUM CHLORIDE CRYS ER 20 MEQ PO TBCR
40.0000 meq | EXTENDED_RELEASE_TABLET | Freq: Once | ORAL | Status: AC
  Administered 2016-07-24: 40 meq via ORAL
  Filled 2016-07-24: qty 2

## 2016-07-24 MED ORDER — DEXTROSE 5 % AND 0.9 % NACL IV BOLUS
1000.0000 mL | Freq: Once | INTRAVENOUS | Status: AC
  Administered 2016-07-24: 1000 mL via INTRAVENOUS
  Filled 2016-07-24: qty 1000

## 2016-07-24 MED ORDER — SODIUM CHLORIDE 0.9 % IV BOLUS (SEPSIS): 1000.0000 mL | Freq: Once | INTRAVENOUS | Status: DC

## 2016-07-24 NOTE — ED Notes (Signed)
Pt was given Sprite per request. Tolerating well, no nausea reported.

## 2016-07-24 NOTE — ED Triage Notes (Signed)
Pt In with co vomiting for few days states was seen here yesterday and given antibiotics for PID, states vomiting has been persistent and not able to keep food or fluids down. Also had diarrhea but none since yest, states has generalized abd cramping.

## 2016-07-24 NOTE — ED Provider Notes (Signed)
[facility] Emergency Department Provider Note  ____________________________________________   First MD Initiated Contact with Patient 07/24/16 0404     (approximate)  I have reviewed the triage vital signs and the nursing notes.   HISTORY  Chief Complaint Emesis   HPI Bailey Cabrera is a 26 y.o. female who is diagnosed with PID 2 years ago in the emergency department who is been using promethazine as well as Zofran at home and has been having persistent nausea vomiting and abdominal soreness since 7 PM yesterday. She is presenting to the emergency department now because she is unable to keep anything down including her anti-emetic medications.   Past Medical History:  Diagnosis Date  . Anemia   . Asthma   . Herpes genitalis 07/20/2014   Has been tx'd since 32weeks for suppresion  . Hypotension   . Seizures (HCC)    During pregnancy; most recent April 2016    Patient Active Problem List   Diagnosis Date Noted  . Uterine contractions during pregnancy 02/26/2016  . Lower abdominal pain 02/03/2016  . Indication for care in labor or delivery 01/27/2016  . Abdominal pain affecting pregnancy 12/19/2015  . Hyperemesis affecting pregnancy, antepartum 08/23/2015  . Labor and delivery indication for care or intervention 07/20/2014  . Marijuana use 07/20/2014  . 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 Anxiety 07/20/2014  . Depression 07/20/2014  . Bipolar 1 disorder (HCC) 07/20/2014  . Fetal hydronephrosis during pregnancy, antepartum 07/20/2014  . Labor and delivery, indication for care 07/20/2014    Past Surgical History:  Procedure Laterality Date  . sonohystogram  2012  . TUBAL LIGATION N/A 02/29/2016   Procedure: POST PARTUM TUBAL LIGATION;  Surgeon: Conard NovakStephen D Jackson, MD;  Location: [facility] ORS;  Service: Gynecology;  Laterality: N/A;  . WISDOM TOOTH EXTRACTION Bilateral 2011   Post op infection    Prior to Admission medications   Medication Sig  Start Date End Date Taking? Authorizing Provider  doxycycline (VIBRA-TABS) 100 MG tablet Take 1 tablet (100 mg total) by mouth 2 (two) times daily. 07/22/16   Minna AntisPaduchowski, Kevin, MD  HYDROcodone-acetaminophen (NORCO/VICODIN) 5-325 MG tablet Take 1 tablet by mouth every 4 (four) hours as needed. 07/22/16   Minna AntisPaduchowski, Kevin, MD  ibuprofen (ADVIL,MOTRIN) 600 MG tablet Take 1 tablet (600 mg total) by mouth every 6 (six) hours. Patient not taking: Reported on 07/22/2016 03/01/16   Conard NovakJackson, Stephen D, MD  ondansetron (ZOFRAN ODT) 4 MG disintegrating tablet Take 1 tablet (4 mg total) by mouth every 8 (eight) hours as needed for nausea or vomiting. 07/22/16   Minna AntisPaduchowski, Kevin, MD  oxyCODONE-acetaminophen (PERCOCET/ROXICET) 5-325 MG tablet Take 2 tablets by mouth every 4 (four) hours as needed (pain scale > 7). Patient not taking: Reported on 07/22/2016 03/01/16   Conard NovakJackson, Stephen D, MD  pantoprazole (PROTONIX) 40 MG tablet Take 40 mg by mouth daily.    [provider]  Prenatal Vit-Fe Fumarate-FA (MULTIVITAMIN-PRENATAL) 27-0.8 MG TABS tablet Take 1 tablet by mouth daily at 12 noon.    [provider]    Allergies Penicillins  No family history on file.  Social History Social History  Substance Use Topics  . Smoking status: Current Every Day Smoker    Packs/day: 0.50    Years: 5.00    Types: Cigarettes  . Smokeless tobacco: Never Used  . Alcohol use No    Review of Systems  Constitutional: No fever/chills Eyes: No visual changes. ENT: No sore throat. Cardiovascular: Denies chest pain. Respiratory:  Denies shortness of breath. Gastrointestinal: No abdominal pain.  No nausea, no vomiting.  No diarrhea.  No constipation. Genitourinary: Negative for dysuria. Musculoskeletal: Negative for back pain. Skin: Negative for rash. Neurological: Negative for headaches, focal weakness or numbness.   ____________________________________________   PHYSICAL EXAM:  VITAL SIGNS: ED  Triage Vitals  Enc Vitals Group     BP 07/24/16 0348 (!) 126/97     Pulse Rate 07/24/16 0348 67     Resp 07/24/16 0347 18     Temp 07/24/16 0347 98.4 F (36.9 C)     Temp Source 07/24/16 0347 Oral     SpO2 07/24/16 0348 100 %     Weight 07/24/16 0347 131 lb (59.4 kg)     Height 07/24/16 0347 5\' 1"  (1.549 m)     Head Circumference --      Peak Flow --      Pain Score 07/24/16 0346 9     Pain Loc --      Pain Edu? --      Excl. in GC? --     Constitutional: Alert and oriented. Actively vomiting bilious vomit while I'm in the room. Eyes: Conjunctivae are normal. PERRL. EOMI. Head: Atraumatic. Nose: No congestion/rhinnorhea. Mouth/Throat: Mucous membranes are moist.   Neck: No stridor.   Cardiovascular: Normal rate, regular rhythm. Grossly normal heart sounds.  Respiratory: Normal respiratory effort.  No retractions. Lungs CTAB. Gastrointestinal: Soft with mild diffuse tenderness. No distention.  Musculoskeletal: No lower extremity tenderness nor edema.  No joint effusions. Neurologic:  Normal speech and language. No gross focal neurologic deficits are appreciated.  Skin:  Skin is warm, dry and intact. No rash noted. Psychiatric: Mood and affect are normal. Speech and behavior are normal.  ____________________________________________   LABS (all labs ordered are listed, but only abnormal results are displayed)  Labs Reviewed  CBC WITH DIFFERENTIAL/PLATELET - Abnormal; Notable for the following:       Result Value   RDW 15.9 (*)    All other components within normal limits  COMPREHENSIVE METABOLIC PANEL - Abnormal; Notable for the following:    Potassium 2.9 (*)    Glucose, Bld 123 (*)    All other components within normal limits  LIPASE, BLOOD   ____________________________________________  EKG   ____________________________________________  RADIOLOGY   ____________________________________________   PROCEDURES  Procedure(s) performed:    Procedures  Critical Care performed:   ____________________________________________   INITIAL IMPRESSION / ASSESSMENT AND PLAN / ED COURSE  Pertinent labs & imaging results that were available during my care of the patient were reviewed by me and considered in my medical decision making (see chart for details).  ----------------------------------------- 6:44 AM on 07/24/2016 -----------------------------------------  Patient feels much improved at this point. Able to tolerate potassium as well as sips of ginger ale. No completes of pain at this time. Patient to be discharged home. We discussed using a bland diet such as crackers, juice and fat-free chicken broth. She has Zofran as well as Phenergan at home. Will be discharged. She is understanding the plan and willing to comply. Unlikely to be a surgical cause of abdominal pain. Mild diffuse tenderness with a soft abdomen. Normal white blood cell count. Likely nausea and vomiting related to concurrent illness of PID.      ____________________________________________   FINAL CLINICAL IMPRESSION(S) / ED DIAGNOSES  Abdominal pain with nausea and vomiting.    NEW MEDICATIONS STARTED DURING THIS VISIT:  New Prescriptions   No medications on file  Note:  This document was prepared using Dragon voice recognition software and may include unintentional dictation errors.    Myrna Blazer, MD 07/24/16 [phone]

## 2016-07-30 ENCOUNTER — Telehealth: Payer: Self-pay

## 2016-07-30 NOTE — Telephone Encounter (Signed)
Pt calling.  She was in the hosp last week for PID.  She is in a lot of pain and doesn't know what to do.  Please call.  [phone]

## 2016-08-05 ENCOUNTER — Encounter: Payer: Self-pay | Admitting: Certified Nurse Midwife

## 2016-08-05 ENCOUNTER — Ambulatory Visit (INDEPENDENT_AMBULATORY_CARE_PROVIDER_SITE_OTHER): Payer: Medicaid Other | Admitting: Certified Nurse Midwife

## 2016-08-05 VITALS — BP 112/72 | HR 76 | Ht 61.0 in | Wt 131.0 lb

## 2016-08-05 DIAGNOSIS — B373 Candidiasis of vulva and vagina: Secondary | ICD-10-CM

## 2016-08-05 DIAGNOSIS — R112 Nausea with vomiting, unspecified: Secondary | ICD-10-CM

## 2016-08-05 DIAGNOSIS — B3731 Acute candidiasis of vulva and vagina: Secondary | ICD-10-CM

## 2016-08-05 DIAGNOSIS — N9489 Other specified conditions associated with female genital organs and menstrual cycle: Secondary | ICD-10-CM

## 2016-08-05 DIAGNOSIS — R1030 Lower abdominal pain, unspecified: Secondary | ICD-10-CM | POA: Diagnosis not present

## 2016-08-05 DIAGNOSIS — R102 Pelvic and perineal pain: Secondary | ICD-10-CM

## 2016-08-05 LAB — POCT URINALYSIS DIPSTICK
Bilirubin, UA: NEGATIVE
Glucose, UA: NEGATIVE
Ketones, UA: NEGATIVE
LEUKOCYTES UA: NEGATIVE
NITRITE UA: NEGATIVE
PROTEIN UA: NEGATIVE
RBC UA: NEGATIVE
Spec Grav, UA: 1.01 (ref 1.010–1.025)
Urobilinogen, UA: 0.2 E.U./dL
pH, UA: 6 (ref 5.0–8.0)

## 2016-08-05 MED ORDER — NYSTATIN-TRIAMCINOLONE 100000-0.1 UNIT/GM-% EX CREA: 1.0000 "application " | TOPICAL_CREAM | Freq: Two times a day (BID) | CUTANEOUS | 1 refills | Status: DC | PRN

## 2016-08-05 MED ORDER — FLUCONAZOLE 150 MG PO TABS: 150.0000 mg | ORAL_TABLET | ORAL | 0 refills | Status: AC

## 2016-08-06 ENCOUNTER — Encounter: Payer: Self-pay | Admitting: Certified Nurse Midwife

## 2016-08-06 ENCOUNTER — Telehealth: Payer: Self-pay | Admitting: Certified Nurse Midwife

## 2016-08-06 NOTE — Telephone Encounter (Signed)
Per CLG ok to change rx to two individual medications. Pharmacy aware.

## 2016-08-06 NOTE — Telephone Encounter (Signed)
Jill Sidelison with [facility] stated that pt's insurance won't pay for nystatin-triamcinolone (MYCOLOG II) cream to have both medications combine into one cream but will pay for them separate. Jill Sidelison is requesting permission to fill the medications as one tube of Nystatin and one tube of Triamcinolone so insurance will cover. Please advise. Thanks TNP

## 2016-08-06 NOTE — Progress Notes (Signed)
Obstetrics & Gynecology Office Visit   Chief Complaint:  Chief Complaint  Patient presents with  . Follow-up    ER f/u pelvic pain    History of Present Illness: 26 year old G4 P3013, now 5 months postpartum, with LMP 07/14/2016, presents with complaints of nausea, vomiting and abdominal pain. She started having abdominal pain 5/9. The abdominal pain was accompanied by nausea, vomiting and diarrhea. She was seen in [facility] and a CT scan was done which revealed mild diffuse colonic thickening consistent with colitis and a questionable soft tissue swelling around the cervix. She was diagnosed with PID based upon this CT, given a injection of Rocephin, hydrated intravenously, given Toradol for pain and was sent home with prescriptions for Norco, doxycycline and Zofran. She then followed up with her PCP on 5/10 who switched her antiemetic to phenergan because of her continued nausea. . On 5/11 she again was seen at [facility] for nausea and vomiting. Her WBC at that time was 8.3K and given potassium for hypokalemia. On 5/13 she was seen at [facility], for same symptoms, treated with IV hydration, IV Haldol, IV Toradol and was discharged when she was able to keep down fluids. The nausea and vomiting started to abate, so she tried to restart doxycycline on 5/18. After her second dose on 5/19 she began having nausea and vomiting and on 5/19 she was admitted to [facility] with sharp LLQ pain and nausea/vomiting. She was again hydrated, given IV clindamycin and gentamycin in treatment of PID that was diagnosed based upon the CT scan results. GC/Chlamydia culture was done on the admission. CT scan was repeated (possible fatty liver) and pelvic ultrasound was done (WNL). Her nausea and vomiting stopped when she stopped the po antibiotics and was tolerating a diet. She was discharged on 5/20 with prescription for doxycycline and Flagyl and advised to premedicate with zofran before taking meds. She was also given a  prescription for Norco. The GC and Chlamydia probes did return since her discharge and were negative.   Significant history includes having a BTL postpartum in December 2017. She was split up with her partner shortly after the birth of her fourth child, but they are now back together. She is not having any diarrhea at this time. Does also complain of vulvovaginal tenderness, and irritation. "It hurts to wipe." She states that the last time she kept down solid food was 5/20. Has been able to keep down soup, water, and Gatorade. Vomited once this AM. Has kept down some water this AM.   Past Medical history also significant for bipolar disorder, anxiety, depression, and MJ use. During last pregnancy, patient had persistent nausea and vomiting through the entire pregnancy and was seen in the ER and at L&D for IV hydration and IV antiemetics.    Review of Systems:  Review of Systems  Constitutional: Positive for fever (has been intermittent). Negative for chills and weight loss.  Respiratory: Negative for cough.   Cardiovascular: Negative for chest pain.  Gastrointestinal: Positive for abdominal pain, heartburn, nausea and vomiting. Negative for blood in stool and diarrhea.  Genitourinary: Negative for dysuria.       Positive for vulvovaginal irritation   Musculoskeletal: Negative for back pain.  Skin: Negative for rash.  Neurological: Negative for headaches.     Past Medical History:  Past Medical History:  Diagnosis Date  . Anemia   . Anxiety and depression   . Asthma   . Bipolar 1 disorder (HCC)   .  Herpes genitalis 07/20/2014   Has been tx'd since 32weeks for suppresion  . Hypotension   . Marijuana use   . Seizures (HCC)    During pregnancy; most recent April 2016    Past Surgical History:  Past Surgical History:  Procedure Laterality Date  . sonohystogram  2012  . TUBAL LIGATION N/A 02/29/2016   Procedure: POST PARTUM TUBAL LIGATION;  Surgeon: Conard NovakStephen D Jackson, MD;  Location:  [facility] ORS;  Service: Gynecology;  Laterality: N/A;  . WISDOM TOOTH EXTRACTION Bilateral 2011   Post op infection    Family History:   Social History:  Social History   Social History  . Marital status: Married    Spouse name: N/A  . Number of children: 4  . Years of education: N/A   Occupational History  . Not on file.   Social History Main Topics  . Smoking status: Current Every Day Smoker    Packs/day: 0.50    Years: 5.00    Types: Cigarettes  . Smokeless tobacco: Never Used  . Alcohol use No  . Drug use: Yes    Types: Marijuana     Comment: Marijuana, last used x1 month ago  . Sexual activity: Not Currently    Birth control/ protection: Surgical   Other Topics Concern  . Not on file   Social History Narrative  . No narrative on file    Allergies:  Allergies  Allergen Reactions  . Penicillins Hives and Swelling    Throat swells.  . Sulfamethoxazole-Trimethoprim Nausea Only  . Amoxicillin Itching, Nausea Only and Rash    Medications: Prior to Admission medications   Medication Sig Start Date End Date Taking? Authorizing Provider  metroNIDAZOLE (FLAGYL) 500 MG tablet Take 500 mg by mouth. 08/02/16 08/16/16 Yes [provider]  ondansetron (ZOFRAN-ODT) 4 MG disintegrating tablet Take 4 mg by mouth. 08/02/16 08/09/16 Yes [provider]  pantoprazole (PROTONIX) 40 MG tablet Take by mouth. 07/23/16  Yes [provider]  promethazine (PHENERGAN) 25 MG tablet Take 25 mg by mouth. 08/02/16 08/09/16 Yes [provider]  doxycycline (VIBRA-TABS) 100 MG tablet Take 1 tablet (100 mg total) by mouth 2 (two) times daily. 07/22/16   Minna AntisPaduchowski, Kevin, MD         HYDROcodone-acetaminophen (NORCO/VICODIN) 5-325 MG tablet Take 1 tablet by mouth every 4 (four) hours as needed. 07/22/16   Minna AntisPaduchowski, Kevin, MD  ibuprofen (ADVIL,MOTRIN) 600 MG tablet Take 1 tablet (600 mg total) by mouth every 6 (six) hours. Patient not taking: Reported on 07/22/2016  03/01/16   Conard NovakJackson, Stephen D, MD         ondansetron (ZOFRAN ODT) 4 MG disintegrating tablet Take 1 tablet (4 mg total) by mouth every 8 (eight) hours as needed for nausea or vomiting. 07/22/16   Minna AntisPaduchowski, Kevin, MD  oxyCODONE-acetaminophen (PERCOCET/ROXICET) 5-325 MG tablet Take 2 tablets by mouth every 4 (four) hours as needed (pain scale > 7). Patient not taking: Reported on 07/22/2016 03/01/16   Conard NovakJackson, Stephen D, MD  valACYclovir (VALTREX) 500 MG tablet Take 500 mg by mouth.    [provider]    Physical Exam Vitals: BP 112/72   Pulse 76   Ht 5\' 1"  (1.549 m)   Wt 131 lb (59.4 kg)   LMP 07/14/2016 (Exact Date)   BMI 24.75 kg/m    Physical Exam  Constitutional: She is oriented to person, place, and time. She appears well-developed and well-nourished. No distress.  HENT:  Head: Normocephalic.  Eyes: No  scleral icterus.  Cardiovascular: Normal rate and regular rhythm.   No murmur heard. Respiratory: Effort normal and breath sounds normal.  GI: Tenderness: generalized.  Soft, non distended, bowel sounds active. Generalized tenderness, but more tenderness in LUQ and LLQ. Voluntary guarding present.   Genitourinary:  Genitourinary Comments: Vulva: inflamed labia majora Vagina: tenderness and inflammation at introitus and vaginally on exam. Discharge white Cervix: closed, no bleeding. +/- CMT vs vaginal tenderness. Uterus: NSSC, AV, NT, mobile Adnexa: no masses, tenderness in LLQ.  Neurological: She is alert and oriented to person, place, and time.  Skin: Skin is warm and dry.  Psychiatric: She has a normal mood and affect.  wet prep positive for hyphae, negative for Trich and clue cells UA negative   Assessment: 26 y.o. [id number] with abdominal pain, nausea and vomiting.  Doubt PID in light of normal WBC, normal pelvic ultrasound, and negative GC/ Chlamydia cultures  Pain, nausea/vomiting and diarrhea initially probably form gastroenteritis and pain, N/V now  probably from reaction to antibiotics and pain medication Monilial vulvovaginitis  Plan: Stop antibiotics Diflucan 150 mgm every three days x 2 doses Mycolog II ointment BID externally BRAT diet, then if tolerated advance slowly RTO prn. Consider GI consult for persistent symptoms.  Farrel Conners, CNM  Extensive review of records from [facility] and [facility] took at least 15 minutes of this visit

## 2016-08-07 LAB — URINE CULTURE: Organism ID, Bacteria: NO GROWTH

## 2016-08-09 LAB — POCT WET PREP (WET MOUNT): Trichomonas Wet Prep HPF POC: ABSENT

## 2016-08-20 ENCOUNTER — Other Ambulatory Visit: Payer: Self-pay | Admitting: Gastroenterology

## 2016-08-20 DIAGNOSIS — R7989 Other specified abnormal findings of blood chemistry: Secondary | ICD-10-CM

## 2016-08-20 DIAGNOSIS — R945 Abnormal results of liver function studies: Principal | ICD-10-CM

## 2016-08-25 ENCOUNTER — Ambulatory Visit: Payer: Medicaid Other

## 2016-09-01 ENCOUNTER — Ambulatory Visit: Payer: Medicaid Other

## 2016-11-26 ENCOUNTER — Encounter: Admission: RE | Payer: Self-pay | Source: Ambulatory Visit

## 2016-11-26 ENCOUNTER — Ambulatory Visit: Admission: RE | Admit: 2016-11-26 | Payer: Medicaid Other | Source: Ambulatory Visit | Admitting: Gastroenterology

## 2016-11-26 SURGERY — ESOPHAGOGASTRODUODENOSCOPY (EGD) WITH PROPOFOL
Anesthesia: General

## 2017-01-06 ENCOUNTER — Emergency Department: Payer: Medicaid Other

## 2017-01-06 ENCOUNTER — Encounter: Payer: Self-pay | Admitting: Emergency Medicine

## 2017-01-06 ENCOUNTER — Emergency Department
Admission: EM | Admit: 2017-01-06 | Discharge: 2017-01-06 | Disposition: A | Discharge status: Home / Self Care | Payer: Medicaid Other | Attending: Emergency Medicine | Admitting: Emergency Medicine

## 2017-01-06 DIAGNOSIS — J40 Bronchitis, not specified as acute or chronic: Secondary | ICD-10-CM

## 2017-01-06 DIAGNOSIS — B349 Viral infection, unspecified: Secondary | ICD-10-CM

## 2017-01-06 DIAGNOSIS — J45901 Unspecified asthma with (acute) exacerbation: Secondary | ICD-10-CM | POA: Diagnosis not present

## 2017-01-06 DIAGNOSIS — R0602 Shortness of breath: Secondary | ICD-10-CM

## 2017-01-06 DIAGNOSIS — R05 Cough: Secondary | ICD-10-CM | POA: Diagnosis present

## 2017-01-06 MED ORDER — IPRATROPIUM-ALBUTEROL 0.5-2.5 (3) MG/3ML IN SOLN
3.0000 mL | Freq: Once | RESPIRATORY_TRACT | Status: AC
  Administered 2017-01-06: 3 mL via RESPIRATORY_TRACT
  Filled 2017-01-06: qty 3

## 2017-01-06 MED ORDER — METHYLPREDNISOLONE SODIUM SUCC 125 MG IJ SOLR
125.0000 mg | Freq: Once | INTRAMUSCULAR | Status: DC
  Filled 2017-01-06: qty 2

## 2017-01-06 MED ORDER — IPRATROPIUM-ALBUTEROL 0.5-2.5 (3) MG/3ML IN SOLN
3.0000 mL | Freq: Once | RESPIRATORY_TRACT | Status: DC
  Filled 2017-01-06: qty 3

## 2017-01-06 MED ORDER — ACETAMINOPHEN 500 MG PO TABS
1000.0000 mg | ORAL_TABLET | Freq: Once | ORAL | Status: AC
  Administered 2017-01-06: 1000 mg via ORAL
  Filled 2017-01-06: qty 2

## 2017-01-06 NOTE — ED Provider Notes (Cosign Needed)
[facility] EMERGENCY DEPARTMENT Provider Note   CSN: [id number] Arrival date & time: 01/06/17  2150     History   Chief Complaint Chief Complaint  Patient presents with  . Cough  . Nasal Congestion    HPI Bailey Cabrera is a 26 y.o. female with a history of asthma presents to the emergency department for evaluation of cough, chest tightness, shortness of breath.  Symptoms have been present for 5-7 days.  Bailey Cabrera has had some subjective fevers that are mild.  Cough is dry, nonproductive.  Bailey Cabrera is a smoker.  Bailey Cabrera states that Bailey Cabrera has tried getting relief with her child's albuterol nebulizer with mild improvement.  Bailey Cabrera denies any chest pain.  No history of blood clots, leg swelling, recent surgeries, long car rides, cancer.   HPI  Past Medical History:  Diagnosis Date  . Anemia   . Anxiety and depression   . Asthma   . Bipolar 1 disorder (HCC)   . Herpes genitalis 07/20/2014   Has been tx'd since 32weeks for suppresion  . Hypotension   . Marijuana use   . Seizures (HCC)    During pregnancy; most recent April 2016    Patient Active Problem List   Diagnosis Date Noted  . Lower abdominal pain 02/03/2016  . Marijuana use 07/20/2014  . 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 Anxiety 07/20/2014  . Depression 07/20/2014  . Bipolar 1 disorder (HCC) 07/20/2014    Past Surgical History:  Procedure Laterality Date  . sonohystogram  2012  . TUBAL LIGATION N/A 02/29/2016   Procedure: POST PARTUM TUBAL LIGATION;  Surgeon: Conard NovakStephen D Jackson, MD;  Location: [facility] ORS;  Service: Gynecology;  Laterality: N/A;  . WISDOM TOOTH EXTRACTION Bilateral 2011   Post op infection    OB History    Gravida Para Term Preterm AB Living   4 3 3   1 3    SAB TAB Ectopic Multiple Live Births         0 3       Home Medications    Prior to Admission medications   Medication Sig Start Date End Date Taking? Authorizing Provider  doxycycline (VIBRA-TABS) 100 MG tablet Take 1  tablet (100 mg total) by mouth 2 (two) times daily. 07/22/16   Minna AntisPaduchowski, Kevin, MD  HYDROcodone-acetaminophen (NORCO/VICODIN) 5-325 MG tablet Take 1 tablet by mouth every 4 (four) hours as needed. 07/22/16   Minna AntisPaduchowski, Kevin, MD  ibuprofen (ADVIL,MOTRIN) 600 MG tablet Take 1 tablet (600 mg total) by mouth every 6 (six) hours. Patient not taking: Reported on 07/22/2016 03/01/16   Conard NovakJackson, Stephen D, MD  nystatin-triamcinolone [facility](MYCOLOG II) cream Apply 1 application topically 2 (two) times daily as needed. 08/05/16   Farrel ConnersGutierrez, Colleen, CNM  ondansetron (ZOFRAN ODT) 4 MG disintegrating tablet Take 1 tablet (4 mg total) by mouth every 8 (eight) hours as needed for nausea or vomiting. 07/22/16   Minna AntisPaduchowski, Kevin, MD  oxyCODONE-acetaminophen (PERCOCET/ROXICET) 5-325 MG tablet Take 2 tablets by mouth every 4 (four) hours as needed (pain scale > 7). Patient not taking: Reported on 07/22/2016 03/01/16   Conard NovakJackson, Stephen D, MD  pantoprazole (PROTONIX) 40 MG tablet Take by mouth. 07/23/16   [provider]  valACYclovir (VALTREX) 500 MG tablet Take 500 mg by mouth.    [provider]    Family History History reviewed. No pertinent family history.  Social History Social History  Substance Use Topics  . Smoking status: Current Every Day Smoker    Packs/day:  1.50    Years: 5.00    Types: Cigarettes  . Smokeless tobacco: Never Used  . Alcohol use No     Allergies   Penicillins; Sulfamethoxazole-trimethoprim; and Amoxicillin   Review of Systems Review of Systems  Constitutional: Positive for fever (low grade subjective). Negative for chills.  HENT: Positive for congestion, rhinorrhea, sinus pain and sinus pressure. Negative for ear pain, sneezing, sore throat, trouble swallowing and voice change.   Eyes: Negative for discharge.  Respiratory: Positive for cough, chest tightness and shortness of breath. Negative for wheezing and stridor.   Cardiovascular: Negative for chest pain and  leg swelling.  Gastrointestinal: Negative for abdominal pain, diarrhea, nausea and vomiting.  Genitourinary: Negative for dysuria.  Musculoskeletal: Negative for arthralgias and back pain.  Skin: Negative for rash.  Neurological: Negative for dizziness and headaches.     Physical Exam Updated Vital Signs BP (!) 138/104 (BP Location: Left Arm)   Pulse (!) 120   Temp 99 F (37.2 C) (Axillary)   Resp 20   Ht 5\' 1"  (1.549 m)   Wt 54.4 kg (120 lb)   LMP 12/08/2016 (Approximate)   SpO2 99%   BMI 22.67 kg/m   Physical Exam  Constitutional: Bailey Cabrera is oriented to person, place, and time. Bailey Cabrera appears well-developed and well-nourished.  Patient appears well, no apparent distress.  No accessory muscle use with breathing.  HENT:  Head: Normocephalic and atraumatic.  Right Ear: External ear normal.  Left Ear: External ear normal.  Nose: Nose normal.  Mouth/Throat: Oropharynx is clear and moist. No oropharyngeal exudate.  Eyes: Conjunctivae are normal.  Neck: Normal range of motion.  Cardiovascular: Regular rhythm.  Tachycardia present.   Pulmonary/Chest: Effort normal. No accessory muscle usage or stridor. No respiratory distress. Bailey Cabrera has no wheezes.  Decreased air movement bilaterally. No wheezing rales or rhonchi  Abdominal: Soft. Bailey Cabrera exhibits no distension. There is no tenderness. There is no guarding.  Musculoskeletal:  No lower extremity swelling, edema, warmth or redness  Lymphadenopathy:    Bailey Cabrera has cervical adenopathy (posterior).  Neurological: Bailey Cabrera is alert and oriented to person, place, and time. Coordination normal.  Skin: Skin is warm.  Psychiatric: Bailey Cabrera has a normal mood and affect. Her behavior is normal.     ED Treatments / Results  Labs (all labs ordered are listed, but only abnormal results are displayed) Labs Reviewed - No data to display  EKG  EKG Interpretation None       Radiology Dg Chest 2 View  Result Date: 01/06/2017 CLINICAL DATA:  Cough and  chest congestion for several days. Intermittent fevers. Shortness of breath when active. Smoker. EXAM: CHEST  2 VIEW COMPARISON:  07/29/2008. FINDINGS: Normal sized heart. Clear lungs with normal vascularity. The lungs are mildly hyperexpanded with minimal diffuse peribronchial thickening. Unremarkable bones. IMPRESSION: No acute abnormality.  Mild changes of COPD and chronic bronchitis. Electronically Signed   By: Beckie Salts M.D.   On: 01/06/2017 22:55    Procedures Procedures (including critical care time)  Medications Ordered in ED Medications  ipratropium-albuterol (DUONEB) 0.5-2.5 (3) MG/3ML nebulizer solution 3 mL (not administered)  methylPREDNISolone sodium succinate (SOLU-MEDROL) 125 mg/2 mL injection 125 mg (not administered)  acetaminophen (TYLENOL) tablet 1,000 mg (1,000 mg Oral Given 01/06/17 2225)  ipratropium-albuterol (DUONEB) 0.5-2.5 (3) MG/3ML nebulizer solution 3 mL (3 mLs Nebulization Given 01/06/17 2225)     Initial Impression / Assessment and Plan / ED Course  I have reviewed the triage vital signs and the nursing notes.  Pertinent labs & imaging results that were available during my care of the patient were reviewed by me and considered in my medical decision making (see chart for details).     26 year old female with history of asthma, has had viral upper respiratory symptoms for the last week.  Chest x-ray showed no evidence of acute cardiopulmonary process.  Chronic bronchitis changes present.  On exam patient with significant chest tightness and decreased air movement.  Symptoms did mildly improve with DuoNeb treatment.  Patient's heart rate in the 120-130s.   Orders for IV placement, Solu-Medrol, EKG, second DuoNeb treatment were placed.   Patient went to the restroom, did not return, left AMA.  Flex area was searched for the patient.  Patient could not be located.   Final Clinical Impressions(s) / ED Diagnoses   Final diagnoses:  Asthma with acute  exacerbation, unspecified asthma severity, unspecified whether persistent  Shortness of breath  Viral illness  Bronchitis    New Prescriptions New Prescriptions   No medications on file     Evon Slack, Cordelia Poche 01/06/17 2352

## 2017-01-06 NOTE — ED Triage Notes (Signed)
Pt to ED from home c/o cough and congestion for several days.  States fevers at home up and down highest around 100.  States green drainage and sputum.  States SOB when active.

## 2017-01-06 NOTE — ED Notes (Signed)
Pt left AMA. Was not able to assess pain level nor obtain E-signature for disposition

## 2017-04-20 ENCOUNTER — Encounter: Admission: RE | Payer: Self-pay | Source: Ambulatory Visit

## 2017-04-20 ENCOUNTER — Ambulatory Visit: Admission: RE | Admit: 2017-04-20 | Payer: Medicaid Other | Source: Ambulatory Visit | Admitting: Gastroenterology

## 2017-04-20 SURGERY — ESOPHAGOGASTRODUODENOSCOPY (EGD) WITH PROPOFOL
Anesthesia: General

## 2019-01-14 ENCOUNTER — Encounter: Payer: Self-pay | Admitting: Emergency Medicine

## 2019-01-14 ENCOUNTER — Emergency Department
Admission: EM | Admit: 2019-01-14 | Discharge: 2019-01-14 | Disposition: A | Discharge status: Home / Self Care | Payer: Medicaid Other | Attending: Emergency Medicine | Admitting: Emergency Medicine

## 2019-01-14 ENCOUNTER — Other Ambulatory Visit: Payer: Self-pay

## 2019-01-14 DIAGNOSIS — J45909 Unspecified asthma, uncomplicated: Secondary | ICD-10-CM | POA: Insufficient documentation

## 2019-01-14 DIAGNOSIS — R112 Nausea with vomiting, unspecified: Secondary | ICD-10-CM | POA: Diagnosis present

## 2019-01-14 DIAGNOSIS — Z79899 Other long term (current) drug therapy: Secondary | ICD-10-CM | POA: Insufficient documentation

## 2019-01-14 DIAGNOSIS — F17210 Nicotine dependence, cigarettes, uncomplicated: Secondary | ICD-10-CM | POA: Insufficient documentation

## 2019-01-14 DIAGNOSIS — R197 Diarrhea, unspecified: Secondary | ICD-10-CM | POA: Diagnosis not present

## 2019-01-14 LAB — COMPREHENSIVE METABOLIC PANEL
ALT: 17 U/L (ref 0–44)
AST: 20 U/L (ref 15–41)
Albumin: 5.1 g/dL — ABNORMAL HIGH (ref 3.5–5.0)
Alkaline Phosphatase: 55 U/L (ref 38–126)
Anion gap: 14 (ref 5–15)
BUN: 12 mg/dL (ref 6–20)
CO2: 22 mmol/L (ref 22–32)
Calcium: 9.5 mg/dL (ref 8.9–10.3)
Chloride: 105 mmol/L (ref 98–111)
Creatinine, Ser: 0.71 mg/dL (ref 0.44–1.00)
GFR calc Af Amer: >60 mL/min (ref >60)
GFR calc non Af Amer: >60 mL/min (ref >60)
Glucose, Bld: 117 mg/dL — ABNORMAL HIGH (ref 70–99)
Potassium: 3.5 mmol/L (ref 3.5–5.1)
Sodium: 141 mmol/L (ref 135–145)
Total Bilirubin: 0.6 mg/dL (ref 0.3–1.2)
Total Protein: 8.3 g/dL — ABNORMAL HIGH (ref 6.5–8.1)

## 2019-01-14 LAB — URINALYSIS, COMPLETE (UACMP) WITH MICROSCOPIC
Bacteria, UA: NONE SEEN
Bilirubin Urine: NEGATIVE
Glucose, UA: NEGATIVE mg/dL
Hgb urine dipstick: NEGATIVE
Ketones, ur: 80 mg/dL — AB
Leukocytes,Ua: NEGATIVE
Nitrite: NEGATIVE
Protein, ur: 30 mg/dL — AB
Specific Gravity, Urine: 1.029 (ref 1.005–1.030)
pH: 6 (ref 5.0–8.0)

## 2019-01-14 LAB — CBC
HCT: 39.6 % (ref 36.0–46.0)
Hemoglobin: 13.7 g/dL (ref 12.0–15.0)
MCH: 32.4 pg (ref 26.0–34.0)
MCHC: 34.6 g/dL (ref 30.0–36.0)
MCV: 93.6 fL (ref 80.0–100.0)
Platelets: 217 10*3/uL (ref 150–400)
RBC: 4.23 MIL/uL (ref 3.87–5.11)
RDW: 11.4 % — ABNORMAL LOW (ref 11.5–15.5)
WBC: 7.1 10*3/uL (ref 4.0–10.5)
nRBC: 0 % (ref 0.0–0.2)

## 2019-01-14 LAB — POCT PREGNANCY, URINE: Preg Test, Ur: NEGATIVE

## 2019-01-14 LAB — LIPASE, BLOOD: Lipase: 19 U/L (ref 11–51)

## 2019-01-14 MED ORDER — ONDANSETRON 4 MG PO TBDP: 4.0000 mg | ORAL_TABLET | Freq: Three times a day (TID) | ORAL | 0 refills | Status: DC | PRN

## 2019-01-14 MED ORDER — SODIUM CHLORIDE 0.9% FLUSH: 3.0000 mL | Freq: Once | INTRAVENOUS | Status: DC

## 2019-01-14 MED ORDER — SODIUM CHLORIDE 0.9 % IV BOLUS
1000.0000 mL | Freq: Once | INTRAVENOUS | Status: AC
  Administered 2019-01-14: 1000 mL via INTRAVENOUS

## 2019-01-14 MED ORDER — MORPHINE SULFATE (PF) 4 MG/ML IV SOLN
4.0000 mg | Freq: Once | INTRAVENOUS | Status: AC
  Administered 2019-01-14: 16:00:00 4 mg via INTRAVENOUS

## 2019-01-14 MED ORDER — ONDANSETRON HCL 4 MG/2ML IJ SOLN
INTRAMUSCULAR | Status: AC
  Filled 2019-01-14: qty 2

## 2019-01-14 MED ORDER — ONDANSETRON HCL 4 MG/2ML IJ SOLN
4.0000 mg | Freq: Once | INTRAMUSCULAR | Status: AC | PRN
  Administered 2019-01-14: 4 mg via INTRAVENOUS
  Filled 2019-01-14: qty 2

## 2019-01-14 MED ORDER — MORPHINE SULFATE (PF) 4 MG/ML IV SOLN
INTRAVENOUS | Status: AC
  Filled 2019-01-14: qty 1

## 2019-01-14 MED ORDER — ONDANSETRON HCL 4 MG/2ML IJ SOLN
4.0000 mg | Freq: Once | INTRAMUSCULAR | Status: AC
  Administered 2019-01-14: 4 mg via INTRAVENOUS

## 2019-01-14 NOTE — ED Notes (Signed)
Pt reports feeling better after meds and fluid. 2nd liter hung. Pt resting quietly.

## 2019-01-14 NOTE — ED Triage Notes (Signed)
Nausea, vomiting and diarrhea since yesterday, abdominal pain.

## 2019-01-14 NOTE — ED Notes (Signed)
Pt to the er for n/v/d. Pt states she ate [employer] 2 days ago and that's when the symptoms began. Pt reports soreness to the upper abdomen

## 2019-01-14 NOTE — ED Notes (Signed)
First Nurse Note: Pt to ED c/o Vomiting, body aches, and diarrhea since last night. Pt is in NAD.

## 2019-01-14 NOTE — Discharge Instructions (Addendum)
Please return for worse pain, fever, vomiting repeatedly and not being able to keep down fluids or feeling sicker.  Use the Zofran melt under tongue wafer 3 times a day as needed for nausea and vomiting.  Please follow-up with your doctor Monday or Tuesday unless you are completely well by then.

## 2019-01-14 NOTE — ED Provider Notes (Signed)
[facility] Emergency Department Provider Note   ____________________________________________   First MD Initiated Contact with Patient 01/14/19 1517     (approximate)  I have reviewed the triage vital signs and the nursing notes.   HISTORY  Chief Complaint Abdominal Pain, Emesis, and Diarrhea    HPI Bailey Cabrera is a 28 y.o. female patient with nausea vomiting and diarrhea since eating at [employer] 2 days ago.  It came on shortly after she ate there.  She has not been able to keep anything down.  She is now having dry heaves.  Her belly is now aching constantly from all the vomiting she feels.  She has not been running a fever.  Pain is moderate.  Worse with palpation.         Past Medical History:  Diagnosis Date  . Anemia   . Anxiety and depression   . Asthma   . Bipolar 1 disorder (HCC)   . Herpes genitalis 07/20/2014   Has been tx'd since 32weeks for suppresion  . Hypotension   . Marijuana use   . Seizures (HCC)    During pregnancy; most recent April 2016    Patient Active Problem List   Diagnosis Date Noted  . Lower abdominal pain 02/03/2016  . Marijuana use 07/20/2014  . 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 Anxiety 07/20/2014  . Depression 07/20/2014  . Bipolar 1 disorder (HCC) 07/20/2014    Past Surgical History:  Procedure Laterality Date  . sonohystogram  2012  . TUBAL LIGATION N/A 02/29/2016   Procedure: POST PARTUM TUBAL LIGATION;  Surgeon: Conard NovakStephen D Jackson, MD;  Location: [facility] ORS;  Service: Gynecology;  Laterality: N/A;  . WISDOM TOOTH EXTRACTION Bilateral 2011   Post op infection    Prior to Admission medications   Medication Sig Start Date End Date Taking? Authorizing Provider  doxycycline (VIBRA-TABS) 100 MG tablet Take 1 tablet (100 mg total) by mouth 2 (two) times daily. 07/22/16   Minna AntisPaduchowski, Kevin, MD  HYDROcodone-acetaminophen (NORCO/VICODIN) 5-325 MG tablet Take 1 tablet by mouth every 4 (four) hours as  needed. 07/22/16   Minna AntisPaduchowski, Kevin, MD  ibuprofen (ADVIL,MOTRIN) 600 MG tablet Take 1 tablet (600 mg total) by mouth every 6 (six) hours. Patient not taking: Reported on 07/22/2016 03/01/16   Conard NovakJackson, Stephen D, MD  nystatin-triamcinolone [facility](MYCOLOG II) cream Apply 1 application topically 2 (two) times daily as needed. 08/05/16   Farrel ConnersGutierrez, Colleen, CNM  ondansetron (ZOFRAN ODT) 4 MG disintegrating tablet Take 1 tablet (4 mg total) by mouth every 8 (eight) hours as needed for nausea or vomiting. 07/22/16   Minna AntisPaduchowski, Kevin, MD  ondansetron (ZOFRAN ODT) 4 MG disintegrating tablet Take 1 tablet (4 mg total) by mouth every 8 (eight) hours as needed for nausea or vomiting. 01/14/19   Arnaldo NatalMalinda, Ciarah Peace F, MD  oxyCODONE-acetaminophen (PERCOCET/ROXICET) 5-325 MG tablet Take 2 tablets by mouth every 4 (four) hours as needed (pain scale > 7). Patient not taking: Reported on 07/22/2016 03/01/16   Conard NovakJackson, Stephen D, MD  pantoprazole (PROTONIX) 40 MG tablet Take by mouth. 07/23/16   [provider]  valACYclovir (VALTREX) 500 MG tablet Take 500 mg by mouth.    [provider]  albuterol (PROVENTIL HFA;VENTOLIN HFA) 108 (90 BASE) MCG/ACT inhaler Inhale 2 puffs into the lungs every 4 (four) hours as needed for wheezing or shortness of breath. 11/27/14 06/19/15  Payton Mccallumonty, Orlando, MD    Allergies Penicillins, Sulfamethoxazole-trimethoprim, and Amoxicillin  No family history on file.  Social  History Social History   Tobacco Use  . Smoking status: Current Every Day Smoker    Packs/day: 1.50    Years: 5.00    Pack years: 7.50    Types: Cigarettes  . Smokeless tobacco: Never Used  Substance Use Topics  . Alcohol use: No  . Drug use: Yes    Types: Marijuana    Comment: Marijuana, last used x1 month ago    Review of Systems  Constitutional: No fever/chills Eyes: No visual changes. ENT: No sore throat. Cardiovascular: Denies chest pain. Respiratory: Denies shortness of breath. Gastrointestinal:  See HPI Genitourinary: Negative for dysuria. Musculoskeletal: Negative for back pain. Skin: Negative for rash. Neurological: Negative for headaches, focal weakness   ____________________________________________   PHYSICAL EXAM:  VITAL SIGNS: ED Triage Vitals  Enc Vitals Group     BP 01/14/19 1408 109/66     Pulse Rate 01/14/19 1408 (!) 53     Resp 01/14/19 1408 20     Temp 01/14/19 1408 98.8 F (37.1 C)     Temp Source 01/14/19 1408 Oral     SpO2 01/14/19 1408 100 %     Weight 01/14/19 1409 120 lb (54.4 kg)     Height 01/14/19 1409 5\' 1"  (1.549 m)     Head Circumference --      Peak Flow --      Pain Score 01/14/19 1409 10     Pain Loc --      Pain Edu? --      Excl. in GC? --     Constitutional: Alert and oriented. Well appearing and in no acute distress. Eyes: Conjunctivae are normal.  Head: Atraumatic. Nose: No congestion/rhinnorhea. Mouth/Throat: Mucous membranes are moist.  Oropharynx non-erythematous. Neck: No stridor. Cardiovascular: Normal rate, regular rhythm. Grossly normal heart sounds.  Good peripheral circulation. Respiratory: Normal respiratory effort.  No retractions. Lungs CTAB. Gastrointestinal: Soft mildly diffusely tender to palpation. No distention. No abdominal bruits. Musculoskeletal: No lower extremity tenderness nor edema.  No joint effusions. Neurologic:  Normal speech and language. No gross focal neurologic deficits are appreciated.  Skin:  Skin is warm, dry and intact. No rash noted.   ____________________________________________   LABS (all labs ordered are listed, but only abnormal results are displayed)  Labs Reviewed  COMPREHENSIVE METABOLIC PANEL - Abnormal; Notable for the following components:      Result Value   Glucose, Bld 117 (*)    Total Protein 8.3 (*)    Albumin 5.1 (*)    All other components within normal limits  CBC - Abnormal; Notable for the following components:   RDW 11.4 (*)    All other components within  normal limits  URINALYSIS, COMPLETE (UACMP) WITH MICROSCOPIC - Abnormal; Notable for the following components:   Color, Urine YELLOW (*)    APPearance HAZY (*)    Ketones, ur 80 (*)    Protein, ur 30 (*)    All other components within normal limits  LIPASE, BLOOD  POC URINE PREG, ED  POCT PREGNANCY, URINE   ____________________________________________  EKG   ____________________________________________  RADIOLOGY  ED MD interpretation:   Official radiology report(s): No results found.  ____________________________________________   PROCEDURES  Procedure(s) performed (including Critical Care):  Procedures   ____________________________________________   INITIAL IMPRESSION / ASSESSMENT AND PLAN / ED COURSE ----------------------------------------- 4:58 PM on 01/14/2019 -----------------------------------------  Patient is now tolerating p.o.  She says she is hungry and she is ready to go home.  I will let her go.  She looks better.     KEELYN MONJARAS was evaluated in Emergency Department on 01/14/2019 for the symptoms described in the history of present illness. She was evaluated in the context of the global COVID-19 pandemic, which necessitated consideration that the patient might be at risk for infection with the SARS-CoV-2 virus that causes COVID-19. Institutional protocols and algorithms that pertain to the evaluation of patients at risk for COVID-19 are in a state of rapid change based on information released by regulatory bodies including the CDC and federal and state organizations. These policies and algorithms were followed during the patient's care in the ED.         ____________________________________________   FINAL CLINICAL IMPRESSION(S) / ED DIAGNOSES  Final diagnoses:  Nausea vomiting and diarrhea     ED Discharge Orders         Ordered    ondansetron (ZOFRAN ODT) 4 MG disintegrating tablet  Every 8 hours PRN     01/14/19 1700            Note:  This document was prepared using Dragon voice recognition software and may include unintentional dictation errors.    Nena Polio, MD 01/14/19 [phone]

## 2019-01-15 ENCOUNTER — Emergency Department
Admission: EM | Admit: 2019-01-15 | Discharge: 2019-01-15 | Disposition: A | Discharge status: Home / Self Care | Payer: Medicaid Other | Attending: Emergency Medicine | Admitting: Emergency Medicine

## 2019-01-15 ENCOUNTER — Encounter: Payer: Self-pay | Admitting: Intensive Care

## 2019-01-15 ENCOUNTER — Other Ambulatory Visit: Payer: Self-pay

## 2019-01-15 DIAGNOSIS — R1110 Vomiting, unspecified: Secondary | ICD-10-CM | POA: Insufficient documentation

## 2019-01-15 DIAGNOSIS — Z5321 Procedure and treatment not carried out due to patient leaving prior to being seen by health care provider: Secondary | ICD-10-CM | POA: Diagnosis not present

## 2019-01-15 LAB — COMPREHENSIVE METABOLIC PANEL
ALT: 14 U/L (ref 0–44)
AST: 18 U/L (ref 15–41)
Albumin: 4.8 g/dL (ref 3.5–5.0)
Alkaline Phosphatase: 51 U/L (ref 38–126)
Anion gap: 12 (ref 5–15)
BUN: 6 mg/dL (ref 6–20)
CO2: 27 mmol/L (ref 22–32)
Calcium: 9.1 mg/dL (ref 8.9–10.3)
Chloride: 104 mmol/L (ref 98–111)
Creatinine, Ser: 0.64 mg/dL (ref 0.44–1.00)
GFR calc Af Amer: >60 mL/min (ref >60)
GFR calc non Af Amer: >60 mL/min (ref >60)
Glucose, Bld: 112 mg/dL — ABNORMAL HIGH (ref 70–99)
Potassium: 3 mmol/L — ABNORMAL LOW (ref 3.5–5.1)
Sodium: 143 mmol/L (ref 135–145)
Total Bilirubin: 0.9 mg/dL (ref 0.3–1.2)
Total Protein: 7.8 g/dL (ref 6.5–8.1)

## 2019-01-15 LAB — CBC
HCT: 38.6 % (ref 36.0–46.0)
Hemoglobin: 13.3 g/dL (ref 12.0–15.0)
MCH: 32.4 pg (ref 26.0–34.0)
MCHC: 34.5 g/dL (ref 30.0–36.0)
MCV: 93.9 fL (ref 80.0–100.0)
Platelets: 206 10*3/uL (ref 150–400)
RBC: 4.11 MIL/uL (ref 3.87–5.11)
RDW: 11.5 % (ref 11.5–15.5)
WBC: 6.7 10*3/uL (ref 4.0–10.5)
nRBC: 0 % (ref 0.0–0.2)

## 2019-01-15 LAB — LIPASE, BLOOD: Lipase: 21 U/L (ref 11–51)

## 2019-01-15 MED ORDER — ONDANSETRON 4 MG PO TBDP
4.0000 mg | ORAL_TABLET | Freq: Once | ORAL | Status: AC
  Administered 2019-01-15: 14:00:00 4 mg via ORAL

## 2019-01-15 MED ORDER — ONDANSETRON 4 MG PO TBDP
ORAL_TABLET | ORAL | Status: AC
  Administered 2019-01-15: 4 mg via ORAL
  Filled 2019-01-15: qty 1

## 2019-01-15 MED ORDER — ONDANSETRON 4 MG PO TBDP
4.0000 mg | ORAL_TABLET | Freq: Once | ORAL | Status: AC | PRN
  Administered 2019-01-15: 14:00:00 4 mg via ORAL
  Filled 2019-01-15: qty 1

## 2019-01-15 NOTE — ED Notes (Signed)
Patient reports she spit out the other zofran when she was dry heaving. Another zofran ordered. See [facility]

## 2019-01-15 NOTE — ED Triage Notes (Signed)
Patient c/o emesis since 01/13/19. Was seen here on 01/14/19 and sent home diagnosed with food poisoning

## 2019-01-15 NOTE — ED Notes (Signed)
Pt to front desk stating that she was leaving and going home.

## 2019-01-16 ENCOUNTER — Telehealth: Payer: Self-pay | Admitting: Emergency Medicine

## 2019-01-16 NOTE — Telephone Encounter (Signed)
Called patient due to lwot to inquire about condition and follow up plans.  She says she continues to vomite everything.  I explaine dthat she ought to return.  I told her that her potassium was lower than one day prior and that.  She says she is going to call pcp and did not want to wait so long to be seen.  I told her that they may ask her to return to ED as well, as she continues to vomit.

## 2019-06-19 ENCOUNTER — Encounter: Payer: Self-pay | Admitting: Emergency Medicine

## 2019-06-19 ENCOUNTER — Emergency Department
Admission: EM | Admit: 2019-06-19 | Discharge: 2019-06-19 | Disposition: A | Discharge status: Home / Self Care | Payer: Medicaid Other | Attending: Emergency Medicine | Admitting: Emergency Medicine

## 2019-06-19 ENCOUNTER — Other Ambulatory Visit: Payer: Self-pay

## 2019-06-19 ENCOUNTER — Emergency Department: Payer: Medicaid Other

## 2019-06-19 DIAGNOSIS — F1210 Cannabis abuse, uncomplicated: Secondary | ICD-10-CM | POA: Diagnosis not present

## 2019-06-19 DIAGNOSIS — T50901A Poisoning by unspecified drugs, medicaments and biological substances, accidental (unintentional), initial encounter: Secondary | ICD-10-CM | POA: Diagnosis not present

## 2019-06-19 DIAGNOSIS — F17210 Nicotine dependence, cigarettes, uncomplicated: Secondary | ICD-10-CM | POA: Diagnosis not present

## 2019-06-19 DIAGNOSIS — F1910 Other psychoactive substance abuse, uncomplicated: Secondary | ICD-10-CM | POA: Diagnosis not present

## 2019-06-19 DIAGNOSIS — Z79899 Other long term (current) drug therapy: Secondary | ICD-10-CM | POA: Diagnosis not present

## 2019-06-19 DIAGNOSIS — F1410 Cocaine abuse, uncomplicated: Secondary | ICD-10-CM | POA: Insufficient documentation

## 2019-06-19 DIAGNOSIS — R4182 Altered mental status, unspecified: Secondary | ICD-10-CM | POA: Diagnosis not present

## 2019-06-19 HISTORY — DX: Other psychoactive substance use, unspecified, uncomplicated: F19.90

## 2019-06-19 LAB — CBC WITH DIFFERENTIAL/PLATELET
Abs Immature Granulocytes: 0.05 10*3/uL (ref 0.00–0.07)
Basophils Absolute: 0.1 10*3/uL (ref 0.0–0.1)
Basophils Relative: 1 %
Eosinophils Absolute: 0.2 10*3/uL (ref 0.0–0.5)
Eosinophils Relative: 2 %
HCT: 39.8 % (ref 36.0–46.0)
Hemoglobin: 13.1 g/dL (ref 12.0–15.0)
Immature Granulocytes: 0 %
Lymphocytes Relative: 37 %
Lymphs Abs: 4.2 10*3/uL — ABNORMAL HIGH (ref 0.7–4.0)
MCH: 32.8 pg (ref 26.0–34.0)
MCHC: 32.9 g/dL (ref 30.0–36.0)
MCV: 99.5 fL (ref 80.0–100.0)
Monocytes Absolute: 0.6 10*3/uL (ref 0.1–1.0)
Monocytes Relative: 5 %
Neutro Abs: 6.4 10*3/uL (ref 1.7–7.7)
Neutrophils Relative %: 55 %
Platelets: 287 10*3/uL (ref 150–400)
RBC: 4 MIL/uL (ref 3.87–5.11)
RDW: 12.9 % (ref 11.5–15.5)
WBC: 11.5 10*3/uL — ABNORMAL HIGH (ref 4.0–10.5)
nRBC: 0 % (ref 0.0–0.2)

## 2019-06-19 LAB — COMPREHENSIVE METABOLIC PANEL
ALT: 45 U/L — ABNORMAL HIGH (ref 0–44)
AST: 72 U/L — ABNORMAL HIGH (ref 15–41)
Albumin: 3.5 g/dL (ref 3.5–5.0)
Alkaline Phosphatase: 95 U/L (ref 38–126)
Anion gap: 12 (ref 5–15)
BUN: 11 mg/dL (ref 6–20)
CO2: 22 mmol/L (ref 22–32)
Calcium: 8.5 mg/dL — ABNORMAL LOW (ref 8.9–10.3)
Chloride: 106 mmol/L (ref 98–111)
Creatinine, Ser: 0.7 mg/dL (ref 0.44–1.00)
GFR calc Af Amer: >60 mL/min (ref >60)
GFR calc non Af Amer: >60 mL/min (ref >60)
Glucose, Bld: 118 mg/dL — ABNORMAL HIGH (ref 70–99)
Potassium: 3.5 mmol/L (ref 3.5–5.1)
Sodium: 140 mmol/L (ref 135–145)
Total Bilirubin: 0.5 mg/dL (ref 0.3–1.2)
Total Protein: 6.8 g/dL (ref 6.5–8.1)

## 2019-06-19 LAB — LIPASE, BLOOD: Lipase: 23 U/L (ref 11–51)

## 2019-06-19 LAB — SALICYLATE LEVEL: Salicylate Lvl: <7 mg/dL — ABNORMAL LOW (ref 7.0–30.0)

## 2019-06-19 LAB — TROPONIN I (HIGH SENSITIVITY)
Troponin I (High Sensitivity): 3 ng/L (ref <18)
Troponin I (High Sensitivity): 9 ng/L (ref <18)

## 2019-06-19 LAB — ACETAMINOPHEN LEVEL: Acetaminophen (Tylenol), Serum: <10 ug/mL — ABNORMAL LOW (ref 10–30)

## 2019-06-19 LAB — ETHANOL: Alcohol, Ethyl (B): <10 mg/dL (ref <10)

## 2019-06-19 MED ORDER — NALOXONE HCL 4 MG/0.1ML NA LIQD: NASAL | 0 refills | Status: AC

## 2019-06-19 MED ORDER — ONDANSETRON HCL 4 MG/2ML IJ SOLN
4.0000 mg | Freq: Once | INTRAMUSCULAR | Status: AC
Start: 2019-06-19 — End: 2019-06-19
  Administered 2019-06-19: 11:00:00 4 mg via INTRAVENOUS
  Filled 2019-06-19: qty 2

## 2019-06-19 MED ORDER — LACTATED RINGERS IV BOLUS
1000.0000 mL | Freq: Once | INTRAVENOUS | Status: AC
  Administered 2019-06-19: 1000 mL via INTRAVENOUS

## 2019-06-19 NOTE — ED Notes (Signed)
Pt assisted to call mom's number that was left with the secretary

## 2019-06-19 NOTE — ED Notes (Signed)
Per lab pt's red top tube hemolyzed, redrawn and sent to lab by this RN and nursing student Lequita Halt

## 2019-06-19 NOTE — ED Notes (Signed)
Pt transported otf for imaging  

## 2019-06-19 NOTE — ED Notes (Signed)
Lab at bedside

## 2019-06-19 NOTE — ED Notes (Signed)
Pt continuously asks this RN where her cell phone is. Advised pt that her belongings include what is still on her body as welll as one blue shirt in pt belongings bag

## 2019-06-19 NOTE — ED Notes (Signed)
Called lab to check on status of blood draw, per Marylene Land she will send someone to draw pt's blood

## 2019-06-19 NOTE — ED Notes (Signed)
Pt speaking more clearly at this time but still having difficulty getting some words out. Pt's sister Marisue Humble called and I advised that I would have to speak with pt before I could give any information out. Pt sitting up in bed when this RN entered there room and I asked pt if I could update her sister on her status. Pt asking for phone to call sister, pt given phone and number dialed for pt that was given to me by her sister

## 2019-06-19 NOTE — ED Provider Notes (Signed)
[facility] Emergency Department Provider Note   ____________________________________________   First MD Initiated Contact with Patient 06/19/19 1048     (approximate)  I have reviewed the triage vital signs and the nursing notes.   HISTORY  Chief Complaint Drug Overdose    HPI Bailey Cabrera is a 29 y.o. female with past medical history of polysubstance abuse, bipolar disorder, and seizures who presents to the ED following overdose.  History is limited due to patient's confusion.  Per EMS, she was found down by roommates daughter, was initially unresponsive.  She seemed to wake up some with EMS but was actively vomiting and confused with some slight respiratory depression.  They gave 1 mg of intranasal Narcan and her respiratory effort seemed to improve, but she continued to vomit during transport.  Patient is unable to provide any history on arrival, has incoherent and slurred speech.  EMS reports that patient has a history of heroin use and was reported to have snorted heroin earlier today.        Past Medical History:  Diagnosis Date  . Anemia   . Anxiety and depression   . Asthma   . Bipolar 1 disorder (HCC)   . Drug use   . Herpes genitalis 07/20/2014   Has been tx'd since 32weeks for suppresion  . Hypotension   . Marijuana use   . Seizures (HCC)    During pregnancy; most recent April 2016    Patient Active Problem List   Diagnosis Date Noted  . Lower abdominal pain 02/03/2016  . Marijuana use 07/20/2014  . 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 Anxiety 07/20/2014  . Depression 07/20/2014  . Bipolar 1 disorder (HCC) 07/20/2014    Past Surgical History:  Procedure Laterality Date  . sonohystogram  2012  . TUBAL LIGATION N/A 02/29/2016   Procedure: POST PARTUM TUBAL LIGATION;  Surgeon: Conard Novak, MD;  Location: [facility] ORS;  Service: Gynecology;  Laterality: N/A;  . WISDOM TOOTH EXTRACTION Bilateral 2011   Post op infection     Prior to Admission medications   Medication Sig Start Date End Date Taking? Authorizing Provider  naloxone [facility]) nasal spray 4 mg/0.1 mL Spray intranasally for overdose 06/19/19   Chesley Noon, MD  pantoprazole (PROTONIX) 40 MG tablet Take by mouth. 07/23/16   [provider]  valACYclovir (VALTREX) 500 MG tablet Take 500 mg by mouth.    [provider]  albuterol (PROVENTIL HFA;VENTOLIN HFA) 108 (90 BASE) MCG/ACT inhaler Inhale 2 puffs into the lungs every 4 (four) hours as needed for wheezing or shortness of breath. 11/27/14 06/19/15  Payton Mccallum, MD    Allergies Penicillins, Sulfamethoxazole-trimethoprim, and Amoxicillin  History reviewed. No pertinent family history.  Social History Social History   Tobacco Use  . Smoking status: Current Every Day Smoker    Packs/day: 1.50    Years: 5.00    Pack years: 7.50    Types: Cigarettes  . Smokeless tobacco: Never Used  Substance Use Topics  . Alcohol use: No  . Drug use: Yes    Types: Marijuana, Cocaine    Comment: Marijuana, last used x1 month ago    Review of Systems Unable to obtain secondary to confusion/intoxication ____________________________________________   PHYSICAL EXAM:  VITAL SIGNS: ED Triage Vitals  Enc Vitals Group     BP      Pulse      Resp      Temp      Temp src  SpO2      Weight      Height      Head Circumference      Peak Flow      Pain Score      Pain Loc      Pain Edu?      Excl. in GC?     Constitutional: Awake and alert, tearful and difficult to redirect. Eyes: Conjunctivae are normal. Head: Atraumatic. Nose: No congestion/rhinnorhea. Mouth/Throat: Mucous membranes are moist. Neck: Normal ROM Cardiovascular: Tachycardic, regular rhythm. Grossly normal heart sounds. Respiratory: Normal respiratory effort.  No retractions. Lungs CTAB. Gastrointestinal: Soft and nontender. No distention. Genitourinary: deferred Musculoskeletal: No lower extremity  tenderness nor edema. Neurologic: Slurred and incoherent speech. No gross focal neurologic deficits are appreciated. Skin:  Skin is warm, dry and intact. No rash noted. Psychiatric: Mood and affect are normal. Speech and behavior are normal.  ____________________________________________   LABS (all labs ordered are listed, but only abnormal results are displayed)  Labs Reviewed  CBC WITH DIFFERENTIAL/PLATELET - Abnormal; Notable for the following components:      Result Value   WBC 11.5 (*)    Lymphs Abs 4.2 (*)    All other components within normal limits  COMPREHENSIVE METABOLIC PANEL - Abnormal; Notable for the following components:   Glucose, Bld 118 (*)    Calcium 8.5 (*)    AST 72 (*)    ALT 45 (*)    All other components within normal limits  ACETAMINOPHEN LEVEL - Abnormal; Notable for the following components:   Acetaminophen (Tylenol), Serum <10 (*)    All other components within normal limits  SALICYLATE LEVEL - Abnormal; Notable for the following components:   Salicylate Lvl <7.0 (*)    All other components within normal limits  LIPASE, BLOOD  ETHANOL  TROPONIN I (HIGH SENSITIVITY)  TROPONIN I (HIGH SENSITIVITY)   ____________________________________________  EKG  ED ECG REPORT I, Chesley Noon, the attending physician, personally viewed and interpreted this ECG.   Date: 06/19/2019  EKG Time: 10:57  Rate: 108  Rhythm: sinus tachycardia  Axis: Normal  Intervals:none  ST&T Change: None   PROCEDURES  Procedure(s) performed (including Critical Care):  .1-3 Lead EKG Interpretation Performed by: Chesley Noon, MD Authorized by: Chesley Noon, MD     Interpretation: abnormal     ECG rate:  110   ECG rate assessment: tachycardic     Rhythm: sinus tachycardia     Ectopy: none     Conduction: normal       ____________________________________________   INITIAL IMPRESSION / ASSESSMENT AND PLAN / ED COURSE       29 year old female with  history of bipolar disorder and polysubstance abuse presents to the ED following reported overdose on heroin, found unresponsive by roommate's daughter.  She is now awake and alert but difficult to redirect with incoherent speech pattern.  She does not have any obvious focal neurologic deficits and no signs of trauma to her head or neck area.  We will screen labs and EKG, treat nausea and vomiting with IV Zofran.  I suspect her current presentation is due to drug intoxication but given slurred speech and unusual presentation for opiate overdose, we will check CT head and C-spine.  CT head and C-spine negative for acute process, lab work unremarkable.  Patient remains confused, able to answer questions more appropriately but with slurred speech and somnolence.  We will need to continue to observe her for clinical sobriety, patient turned over to  oncoming provider.  Will provide resources for outpatient substance abuse treatment.      ____________________________________________   FINAL CLINICAL IMPRESSION(S) / ED DIAGNOSES  Final diagnoses:  Accidental drug overdose, initial encounter  Polysubstance abuse [facility])     ED Discharge Orders         Ordered    naloxone (NARCAN) nasal spray 4 mg/0.1 mL     06/19/19 1538           Note:  This document was prepared using Dragon voice recognition software and may include unintentional dictation errors.   Blake Divine, MD 06/19/19 1539
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
## 2019-06-19 NOTE — ED Triage Notes (Signed)
pts roommate daughter called EMS because pt was unresponsive.  Pt given 1 mg narcan by EMS, known heroine user.  Pt arrived alert but unable to speak clearly. Vomit noted on shirt.    Pt yelling and crying out but speech itself is not comprehensible

## 2019-06-19 NOTE — ED Notes (Signed)
Mom at bedside. Pt transferred to toilet with this RN's assistance. PT assisted to pull down underwear and pants because she seemed too confused to pull them down.

## 2019-06-19 NOTE — ED Notes (Signed)
Pt speaking with this RN and speech continues to be hard to understand at times.

## 2019-06-19 NOTE — ED Notes (Signed)
Lab draw attempted by this RN without success, lab contacted to send someone to draw blood

## 2019-06-19 NOTE — ED Notes (Signed)
Pt ambulated to toilet with steady gait. PT still confused, asking for someone named "Kip".

## 2019-07-24 ENCOUNTER — Other Ambulatory Visit: Payer: Self-pay

## 2019-07-24 ENCOUNTER — Ambulatory Visit
Admission: EM | Admit: 2019-07-24 | Discharge: 2019-07-24 | Disposition: A | Discharge status: Home / Self Care | Payer: Medicaid Other | Attending: Family Medicine | Admitting: Family Medicine

## 2019-07-24 ENCOUNTER — Encounter: Payer: Self-pay | Admitting: Emergency Medicine

## 2019-07-24 NOTE — ED Triage Notes (Signed)
Patient states she has had bumps on her arms "forever" and when you pick the bumps she states you can see bugs. She also reports seeing a bug on both side of her nose and states she feels like they are going into her ears now.

## 2020-03-16 DIAGNOSIS — F209 Schizophrenia, unspecified: Secondary | ICD-10-CM

## 2020-03-16 HISTORY — DX: Schizophrenia, unspecified: F20.9

## 2021-11-07 ENCOUNTER — Emergency Department
Admission: EM | Admit: 2021-11-07 | Discharge: 2021-11-07 | Discharge status: Against Medical Advice | Payer: Medicaid Other | Attending: Emergency Medicine | Admitting: Emergency Medicine

## 2021-11-07 ENCOUNTER — Other Ambulatory Visit: Payer: Self-pay

## 2021-11-07 DIAGNOSIS — R440 Auditory hallucinations: Secondary | ICD-10-CM | POA: Insufficient documentation

## 2021-11-07 DIAGNOSIS — Z5321 Procedure and treatment not carried out due to patient leaving prior to being seen by health care provider: Secondary | ICD-10-CM | POA: Diagnosis not present

## 2021-11-07 DIAGNOSIS — L98499 Non-pressure chronic ulcer of skin of other sites with unspecified severity: Secondary | ICD-10-CM | POA: Diagnosis not present

## 2021-11-07 DIAGNOSIS — Y900 Blood alcohol level of less than 20 mg/100 ml: Secondary | ICD-10-CM | POA: Diagnosis not present

## 2021-11-07 LAB — ETHANOL: Alcohol, Ethyl (B): <10 mg/dL (ref <10)

## 2021-11-07 LAB — COMPREHENSIVE METABOLIC PANEL
ALT: 51 U/L — ABNORMAL HIGH (ref 0–44)
AST: 37 U/L (ref 15–41)
Albumin: 4.1 g/dL (ref 3.5–5.0)
Alkaline Phosphatase: 71 U/L (ref 38–126)
Anion gap: 9 (ref 5–15)
BUN: 10 mg/dL (ref 6–20)
CO2: 23 mmol/L (ref 22–32)
Calcium: 9.3 mg/dL (ref 8.9–10.3)
Chloride: 108 mmol/L (ref 98–111)
Creatinine, Ser: 0.76 mg/dL (ref 0.44–1.00)
GFR, Estimated: >60 mL/min (ref >60)
Glucose, Bld: 138 mg/dL — ABNORMAL HIGH (ref 70–99)
Potassium: 3 mmol/L — ABNORMAL LOW (ref 3.5–5.1)
Sodium: 140 mmol/L (ref 135–145)
Total Bilirubin: 0.8 mg/dL (ref 0.3–1.2)
Total Protein: 8.4 g/dL — ABNORMAL HIGH (ref 6.5–8.1)

## 2021-11-07 LAB — CBC
HCT: 39.9 % (ref 36.0–46.0)
Hemoglobin: 13.1 g/dL (ref 12.0–15.0)
MCH: 29.4 pg (ref 26.0–34.0)
MCHC: 32.8 g/dL (ref 30.0–36.0)
MCV: 89.5 fL (ref 80.0–100.0)
Platelets: 318 10*3/uL (ref 150–400)
RBC: 4.46 MIL/uL (ref 3.87–5.11)
RDW: 12.2 % (ref 11.5–15.5)
WBC: 7.6 10*3/uL (ref 4.0–10.5)
nRBC: 0 % (ref 0.0–0.2)

## 2021-11-07 LAB — SALICYLATE LEVEL: Salicylate Lvl: <7 mg/dL — ABNORMAL LOW (ref 7.0–30.0)

## 2021-11-07 LAB — ACETAMINOPHEN LEVEL: Acetaminophen (Tylenol), Serum: <10 ug/mL — ABNORMAL LOW (ref 10–30)

## 2021-11-07 NOTE — ED Notes (Signed)
First Nurse Note: Pt to ED via POV for sores all over her body

## 2021-11-07 NOTE — ED Notes (Signed)
Pt seen walking out of ED lobby with family member.  Pt did not inform staff if they were leaving.  Pt seen walking out to parking lot.  NAD noted.

## 2021-11-07 NOTE — ED Notes (Signed)
Attempted to bring pt to room.  Pt told she was going to come back to room to see ER staff and psych team.  Pt states she does not need to see psych anymore and is needing to be seen for her itching scabs al over.

## 2021-11-07 NOTE — ED Notes (Signed)
Pt states she is going outside to smoke a cigarette at this time.

## 2021-11-07 NOTE — ED Triage Notes (Signed)
Pt to ED for multiple complaints. Pt states has schizophrenia and was released from jail on 10/23/21 and has not had her meds since then. States is seeing "the police", that "there are bugs in my bed when I get up", "I'm seeing things and hearing things" and also for complaints of multiple skin ulcerations.    Pt is picking at sores on face arms and chest. Pt denies SI and HI, states is hearing voices "just talking, saying normal things". Accompanied by grandfather.

## 2021-11-07 NOTE — ED Notes (Signed)
Beecher Mcardle, RN called pt back to triage her. Pt declined to be taken to triage at this time stating that she did not know if she wanted to stay to be seen.

## 2021-11-07 NOTE — ED Notes (Signed)
Explained process of getting dressed out and being seen by psychiatrist. Pt now stating going to leave because family member cannot stay with her and states has appt at [facility] on Tuesday. EDP informed and will come see pt in triage room.

## 2021-11-12 ENCOUNTER — Ambulatory Visit
Admit: 2021-11-12 | Discharge: 2021-11-12 | Disposition: A | Discharge status: Home / Self Care | Payer: PRIVATE HEALTH INSURANCE

## 2021-12-07 ENCOUNTER — Emergency Department
Admission: EM | Admit: 2021-12-07 | Discharge: 2021-12-07 | Disposition: A | Discharge status: Home / Self Care | Payer: Medicaid Other | Attending: Emergency Medicine | Admitting: Emergency Medicine

## 2021-12-07 ENCOUNTER — Other Ambulatory Visit: Payer: Self-pay

## 2021-12-07 DIAGNOSIS — F1910 Other psychoactive substance abuse, uncomplicated: Secondary | ICD-10-CM | POA: Diagnosis not present

## 2021-12-07 DIAGNOSIS — F319 Bipolar disorder, unspecified: Secondary | ICD-10-CM | POA: Diagnosis present

## 2021-12-07 DIAGNOSIS — F15959 Other stimulant use, unspecified with stimulant-induced psychotic disorder, unspecified: Secondary | ICD-10-CM | POA: Insufficient documentation

## 2021-12-07 DIAGNOSIS — Z20822 Contact with and (suspected) exposure to covid-19: Secondary | ICD-10-CM | POA: Insufficient documentation

## 2021-12-07 DIAGNOSIS — F29 Unspecified psychosis not due to a substance or known physiological condition: Secondary | ICD-10-CM | POA: Diagnosis not present

## 2021-12-07 DIAGNOSIS — F1510 Other stimulant abuse, uncomplicated: Secondary | ICD-10-CM | POA: Diagnosis not present

## 2021-12-07 LAB — URINE DRUG SCREEN, QUALITATIVE (ARMC ONLY)
Amphetamines, Ur Screen: POSITIVE — AB
Barbiturates, Ur Screen: NOT DETECTED
Benzodiazepine, Ur Scrn: NOT DETECTED
Cannabinoid 50 Ng, Ur ~~LOC~~: NOT DETECTED
Cocaine Metabolite,Ur ~~LOC~~: POSITIVE — AB
MDMA (Ecstasy)Ur Screen: NOT DETECTED
Methadone Scn, Ur: NOT DETECTED
Opiate, Ur Screen: NOT DETECTED
Phencyclidine (PCP) Ur S: NOT DETECTED
Tricyclic, Ur Screen: NOT DETECTED

## 2021-12-07 LAB — ACETAMINOPHEN LEVEL: Acetaminophen (Tylenol), Serum: <10 ug/mL — ABNORMAL LOW (ref 10–30)

## 2021-12-07 LAB — COMPREHENSIVE METABOLIC PANEL
ALT: 85 U/L — ABNORMAL HIGH (ref 0–44)
AST: 48 U/L — ABNORMAL HIGH (ref 15–41)
Albumin: 4.3 g/dL (ref 3.5–5.0)
Alkaline Phosphatase: 89 U/L (ref 38–126)
Anion gap: 12 (ref 5–15)
BUN: 14 mg/dL (ref 6–20)
CO2: 24 mmol/L (ref 22–32)
Calcium: 9.3 mg/dL (ref 8.9–10.3)
Chloride: 105 mmol/L (ref 98–111)
Creatinine, Ser: 0.83 mg/dL (ref 0.44–1.00)
GFR, Estimated: >60 mL/min (ref >60)
Glucose, Bld: 145 mg/dL — ABNORMAL HIGH (ref 70–99)
Potassium: 3.1 mmol/L — ABNORMAL LOW (ref 3.5–5.1)
Sodium: 141 mmol/L (ref 135–145)
Total Bilirubin: 0.7 mg/dL (ref 0.3–1.2)
Total Protein: 8.9 g/dL — ABNORMAL HIGH (ref 6.5–8.1)

## 2021-12-07 LAB — CBC
HCT: 40.7 % (ref 36.0–46.0)
Hemoglobin: 13.4 g/dL (ref 12.0–15.0)
MCH: 29.6 pg (ref 26.0–34.0)
MCHC: 32.9 g/dL (ref 30.0–36.0)
MCV: 89.8 fL (ref 80.0–100.0)
Platelets: 341 10*3/uL (ref 150–400)
RBC: 4.53 MIL/uL (ref 3.87–5.11)
RDW: 13.2 % (ref 11.5–15.5)
WBC: 7.9 10*3/uL (ref 4.0–10.5)
nRBC: 0 % (ref 0.0–0.2)

## 2021-12-07 LAB — ETHANOL: Alcohol, Ethyl (B): <10 mg/dL (ref <10)

## 2021-12-07 LAB — SALICYLATE LEVEL: Salicylate Lvl: <7 mg/dL — ABNORMAL LOW (ref 7.0–30.0)

## 2021-12-07 LAB — RESP PANEL BY RT-PCR (FLU A&B, COVID) ARPGX2
Influenza A by PCR: NEGATIVE
Influenza B by PCR: NEGATIVE
SARS Coronavirus 2 by RT PCR: NEGATIVE

## 2021-12-07 MED ORDER — ZIPRASIDONE MESYLATE 20 MG IM SOLR
20.0000 mg | Freq: Once | INTRAMUSCULAR | Status: AC
  Administered 2021-12-07: 20 mg via INTRAMUSCULAR
  Filled 2021-12-07: qty 20

## 2021-12-07 MED ORDER — POTASSIUM CHLORIDE CRYS ER 20 MEQ PO TBCR
40.0000 meq | EXTENDED_RELEASE_TABLET | Freq: Once | ORAL | Status: AC
  Administered 2021-12-07: 40 meq via ORAL
  Filled 2021-12-07: qty 2

## 2021-12-07 MED ORDER — MUPIROCIN 2 % EX OINT: 1.0000 | TOPICAL_OINTMENT | Freq: Two times a day (BID) | CUTANEOUS | 1 refills | Status: DC

## 2021-12-07 MED ORDER — DOXYCYCLINE HYCLATE 100 MG PO CAPS: 100.0000 mg | ORAL_CAPSULE | Freq: Two times a day (BID) | ORAL | 0 refills | Status: AC

## 2021-12-07 MED ORDER — GABAPENTIN 300 MG PO CAPS
300.0000 mg | ORAL_CAPSULE | Freq: Three times a day (TID) | ORAL | Status: DC
  Administered 2021-12-07: 300 mg via ORAL
  Filled 2021-12-07: qty 1

## 2021-12-07 NOTE — Consult Note (Signed)
[facility] Face-to-Face Psychiatry Consult   Reason for Consult:  meth induced psychosis Referring Physician:  EDP Patient Identification: Bailey Cabrera MRN:  [id number] Principal Diagnosis: Polysubstance abuse (HCC) Diagnosis:  Principal Problem:   Polysubstance abuse (HCC) Active Problems:   Methamphetamine abuse (HCC)   Bipolar 1 disorder (HCC)   Total Time spent with patient: 45 minutes  Subjective:   Bailey Cabrera is a 31 y.o. female patient admitted with psychosis after using meth.  HPI:  31 yo female presented with hallucinations after using meth.  Now, she is clear and coherent.  She reports having schizophrenia with an appointment with [facility] tomorrow.  Denies suicidal/homicidal ideations.   She is having anxiety from meth use, gabapentin ordered.  Auditory hallucinations have improved and she "can barely" hear them.  She desires to leave and make her appointment tomorrow, psych cleared.  Past Psychiatric History: schizophrenia, meth abuse  Risk to Self:  none Risk to Others:  none Prior Inpatient Therapy:  yes Prior Outpatient Therapy:  [facility]  Past Medical History:  Past Medical History:  Diagnosis Date   Anemia    Anxiety and depression    Asthma    Bipolar 1 disorder (HCC)    Drug use    Herpes genitalis 07/20/2014   Has been tx'd since 32weeks for suppresion   Hypotension    Marijuana use    Schizophrenia (HCC) 2022   diagnosed while in prision in 2022   Seizures (HCC)    During pregnancy; most recent April 2016    Past Surgical History:  Procedure Laterality Date   sonohystogram  2012   TUBAL LIGATION N/A 02/29/2016   Procedure: POST PARTUM TUBAL LIGATION;  Surgeon: Conard Novak, MD;  Location: [facility] ORS;  Service: Gynecology;  Laterality: N/A;   WISDOM TOOTH EXTRACTION Bilateral 2011   Post op infection   Family History: No family history on file. Family Psychiatric  History: none Social History:  Social History   Substance and Sexual  Activity  Alcohol Use No     Social History   Substance and Sexual Activity  Drug Use Yes   Types: Marijuana, Cocaine   Comment: Heroin last use 07/22/2019, marijuana last use 07/22/2019, meth last use 2 weeks ago    Social History   Socioeconomic History   Marital status: Married    Spouse name: Not on file   Number of children: 4   Years of education: Not on file   Highest education level: Not on file  Occupational History   Not on file  Tobacco Use   Smoking status: Every Day    Packs/day: 1.50    Years: 5.00    Total pack years: 7.50    Types: Cigarettes   Smokeless tobacco: Never  Vaping Use   Vaping Use: Never used  Substance and Sexual Activity   Alcohol use: No   Drug use: Yes    Types: Marijuana, Cocaine    Comment: Heroin last use 07/22/2019, marijuana last use 07/22/2019, meth last use 2 weeks ago   Sexual activity: Not Currently    Birth control/protection: Surgical  Other Topics Concern   Not on file  Social History Narrative   Not on file   Social Determinants of Health   Financial Resource Strain: Not on file  Food Insecurity: Not on file  Transportation Needs: Not on file  Physical Activity: Not on file  Stress: Not on file  Social Connections: Not on file   Additional Social History:  Allergies:   Allergies  Allergen Reactions   Penicillins Hives and Swelling    Throat swells.   Sulfamethoxazole-Trimethoprim Nausea Only   Amoxicillin Itching, Nausea Only and Rash    Labs:  Results for orders placed or performed during the hospital encounter of 12/07/21 (from the past 48 hour(s))  Comprehensive metabolic panel     Status: Abnormal   Collection Time: 12/07/21  3:27 AM  Result Value Ref Range   Sodium 141 135 - 145 mmol/L   Potassium 3.1 (L) 3.5 - 5.1 mmol/L   Chloride 105 98 - 111 mmol/L   CO2 24 22 - 32 mmol/L   Glucose, Bld 145 (H) 70 - 99 mg/dL    Comment: Glucose reference range applies only to samples taken after fasting  for at least 8 hours.   BUN 14 6 - 20 mg/dL   Creatinine, Ser 0.83 0.44 - 1.00 mg/dL   Calcium 9.3 8.9 - 10.3 mg/dL   Total Protein 8.9 (H) 6.5 - 8.1 g/dL   Albumin 4.3 3.5 - 5.0 g/dL   AST 48 (H) 15 - 41 U/L   ALT 85 (H) 0 - 44 U/L   Alkaline Phosphatase 89 38 - 126 U/L   Total Bilirubin 0.7 0.3 - 1.2 mg/dL   GFR, Estimated >60 >60 mL/min    Comment: (NOTE) Calculated using the CKD-EPI Creatinine Equation (2021)    Anion gap 12 5 - 15    Comment: Performed at [facility], [address] [place], [place] [address]  Ethanol     Status: None   Collection Time: 12/07/21  3:27 AM  Result Value Ref Range   Alcohol, Ethyl (B) <10 <10 mg/dL    Comment: (NOTE) Lowest detectable limit for serum alcohol is 10 mg/dL.  For medical purposes only. Performed at [facility], [address] [place], [place] [address]   Salicylate level     Status: Abnormal   Collection Time: 12/07/21  3:27 AM  Result Value Ref Range   Salicylate Lvl <9.3 (L) 7.0 - 30.0 mg/dL    Comment: Performed at [facility], [address] [place], [place] [address]  Acetaminophen level     Status: Abnormal   Collection Time: 12/07/21  3:27 AM  Result Value Ref Range   Acetaminophen (Tylenol), Serum <10 (L) 10 - 30 ug/mL    Comment: (NOTE) Therapeutic concentrations vary significantly. A range of 10-30 ug/mL  may be an effective concentration for many patients. However, some  are best treated at concentrations outside of this range. Acetaminophen concentrations >150 ug/mL at 4 hours after ingestion  and >50 ug/mL at 12 hours after ingestion are often associated with  toxic reactions.  Performed at [facility], [address] [place], [place] [address]   cbc     Status: None   Collection Time: 12/07/21  3:27 AM  Result Value Ref Range   WBC 7.9 4.0 - 10.5 K/uL   RBC 4.53 3.87 - 5.11 MIL/uL   Hemoglobin 13.4 12.0 - 15.0 g/dL   HCT 40.7 36.0 - 46.0 %   MCV 89.8  80.0 - 100.0 fL   MCH 29.6 26.0 - 34.0 pg   MCHC 32.9 30.0 - 36.0 g/dL   RDW 13.2 11.5 - 15.5 %   Platelets 341 150 - 400 K/uL   nRBC 0.0 0.0 - 0.2 %    Comment: Performed at [facility], [address] [place], [place] [address]  Urine Drug Screen,  Qualitative     Status: Abnormal   Collection Time: 12/07/21  4:13 AM  Result Value Ref Range   Tricyclic, Ur Screen NONE DETECTED NONE DETECTED   Amphetamines, Ur Screen POSITIVE (A) NONE DETECTED   MDMA (Ecstasy)Ur Screen NONE DETECTED NONE DETECTED   Cocaine Metabolite,Ur [place] POSITIVE (A) NONE DETECTED   Opiate, Ur Screen NONE DETECTED NONE DETECTED   Phencyclidine (PCP) Ur S NONE DETECTED NONE DETECTED   Cannabinoid 50 Ng, Ur [place] NONE DETECTED NONE DETECTED   Barbiturates, Ur Screen NONE DETECTED NONE DETECTED   Benzodiazepine, Ur Scrn NONE DETECTED NONE DETECTED   Methadone Scn, Ur NONE DETECTED NONE DETECTED    Comment: (NOTE) Tricyclics + metabolites, urine    Cutoff 1000 ng/mL Amphetamines + metabolites, urine  Cutoff 1000 ng/mL MDMA (Ecstasy), urine              Cutoff 500 ng/mL Cocaine Metabolite, urine          Cutoff 300 ng/mL Opiate + metabolites, urine        Cutoff 300 ng/mL Phencyclidine (PCP), urine         Cutoff 25 ng/mL Cannabinoid, urine                 Cutoff 50 ng/mL Barbiturates + metabolites, urine  Cutoff 200 ng/mL Benzodiazepine, urine              Cutoff 200 ng/mL Methadone, urine                   Cutoff 300 ng/mL  The urine drug screen provides only a preliminary, unconfirmed analytical test result and should not be used for non-medical purposes. Clinical consideration and professional judgment should be applied to any positive drug screen result due to possible interfering substances. A more specific alternate chemical method must be used in order to obtain a confirmed analytical result. Gas chromatography / mass spectrometry (GC/MS) is the preferred confirm atory method. Performed at  [facility], [address] [place], [place] [address]   Resp Panel by RT-PCR (Flu A&B, Covid) Anterior Nasal Swab     Status: None   Collection Time: 12/07/21  4:13 AM   Specimen: Anterior Nasal Swab  Result Value Ref Range   SARS Coronavirus 2 by RT PCR NEGATIVE NEGATIVE    Comment: (NOTE) SARS-CoV-2 target nucleic acids are NOT DETECTED.  The SARS-CoV-2 RNA is generally detectable in upper respiratory specimens during the acute phase of infection. The lowest concentration of SARS-CoV-2 viral copies this assay can detect is 138 copies/mL. A negative result does not preclude SARS-Cov-2 infection and should not be used as the sole basis for treatment or other patient management decisions. A negative result may occur with  improper specimen collection/handling, submission of specimen other than nasopharyngeal swab, presence of viral mutation(s) within the areas targeted by this assay, and inadequate number of viral copies(<138 copies/mL). A negative result must be combined with clinical observations, patient history, and epidemiological information. The expected result is Negative.  Fact Sheet for Patients:  BloggerCourse.com  Fact Sheet for Healthcare Providers:  SeriousBroker.it  This test is no t yet approved or cleared by the [place] FDA and  has been authorized for detection and/or diagnosis of SARS-CoV-2 by FDA under an Emergency Use Authorization (EUA). This EUA will remain  in effect (meaning this test can be used) for the duration of the COVID-19 declaration under Section 564(b)(1) of the Act, 21 U.S.C.section 360bbb-3(b)(1), unless the authorization is  terminated  or revoked sooner.       Influenza A by PCR NEGATIVE NEGATIVE   Influenza B by PCR NEGATIVE NEGATIVE    Comment: (NOTE) The Xpert Xpress SARS-CoV-2/FLU/RSV plus assay is intended as an aid in the diagnosis of influenza from Nasopharyngeal  swab specimens and should not be used as a sole basis for treatment. Nasal washings and aspirates are unacceptable for Xpert Xpress SARS-CoV-2/FLU/RSV testing.  Fact Sheet for Patients: BloggerCourse.comhttps://www.fda.gov/media/152166/download  Fact Sheet for Healthcare Providers: SeriousBroker.ithttps://www.fda.gov/media/152162/download  This test is not yet approved or cleared by the [place] FDA and has been authorized for detection and/or diagnosis of SARS-CoV-2 by FDA under an Emergency Use Authorization (EUA). This EUA will remain in effect (meaning this test can be used) for the duration of the COVID-19 declaration under Section 564(b)(1) of the Act, 21 U.S.C. section 360bbb-3(b)(1), unless the authorization is terminated or revoked.  Performed at [facility], [address] [place] [address] [place], [place] [address]     Current Facility-Administered Medications  Medication Dose Route Frequency Provider Last Rate Last Admin   gabapentin (NEURONTIN) capsule 300 mg  300 mg Oral TID Charm RingsLord, Pallas Wahlert Y, NP       Current Outpatient Medications  Medication Sig Dispense Refill   naloxone (NARCAN) nasal spray 4 mg/0.1 mL Spray intranasally for overdose 1 each 0    Musculoskeletal: Strength & Muscle Tone: within normal limits Gait & Station: normal Patient leans: N/A  Psychiatric Specialty Exam: Physical Exam Vitals and nursing note reviewed.  Constitutional:      Appearance: Normal appearance.  HENT:     Head: Normocephalic.     Nose: Nose normal.  Pulmonary:     Effort: Pulmonary effort is normal.  Musculoskeletal:        General: Normal range of motion.     Cervical back: Normal range of motion.  Neurological:     General: No focal deficit present.     Mental Status: She is alert and oriented to person, place, and time.  Psychiatric:        Attention and Perception: She perceives auditory hallucinations.        Mood and Affect: Mood is anxious.        Speech: Speech normal.        Behavior:  Behavior normal. Behavior is cooperative.        Thought Content: Thought content normal.        Cognition and Memory: Cognition and memory normal.        Judgment: Judgment normal.     Review of Systems  Psychiatric/Behavioral:  Positive for substance abuse. The patient is nervous/anxious.   All other systems reviewed and are negative.   Blood pressure (!) 128/92, pulse (!) 119, temperature 98.7 F (37.1 C), temperature source Oral, resp. rate 18, height 5\' 1"  (1.549 m), weight 81.6 kg, SpO2 94 %, unknown if currently breastfeeding.Body mass index is 34.01 kg/m.  General Appearance: Disheveled  Eye Contact:  Good  Speech:  Normal Rate  Volume:  Normal  Mood:  Anxious  Affect:  Congruent  Thought Process:  Coherent  Orientation:  Full (Time, Place, and Person)  Thought Content:  Hallucinations: Auditory  Suicidal Thoughts:  No  Homicidal Thoughts:  No  Memory:  Immediate;   Fair Recent;   Fair Remote;   Fair  Judgement:  Fair  Insight:  Fair  Psychomotor Activity:  Normal  Concentration:  Concentration: Good and Attention Span: Good  Recall:  Good  Fund  of Knowledge:  Fair  Language:  Good  Akathisia:  No  Handed:  Right  AIMS (if indicated):     Assets:  Housing Leisure Time Physical Health Resilience Social Support  ADL's:  Intact  Cognition:  WNL  Sleep:         Physical Exam: Physical Exam Vitals and nursing note reviewed.  Constitutional:      Appearance: Normal appearance.  HENT:     Head: Normocephalic.     Nose: Nose normal.  Pulmonary:     Effort: Pulmonary effort is normal.  Musculoskeletal:        General: Normal range of motion.     Cervical back: Normal range of motion.  Neurological:     General: No focal deficit present.     Mental Status: She is alert and oriented to person, place, and time.  Psychiatric:        Attention and Perception: She perceives auditory hallucinations.        Mood and Affect: Mood is anxious.        Speech:  Speech normal.        Behavior: Behavior normal. Behavior is cooperative.        Thought Content: Thought content normal.        Cognition and Memory: Cognition and memory normal.        Judgment: Judgment normal.    Review of Systems  Psychiatric/Behavioral:  Positive for substance abuse. The patient is nervous/anxious.   All other systems reviewed and are negative.  Blood pressure (!) 128/92, pulse (!) 119, temperature 98.7 F (37.1 C), temperature source Oral, resp. rate 18, height 5\' 1"  (1.549 m), weight 81.6 kg, SpO2 94 %, unknown if currently breastfeeding. Body mass index is 34.01 kg/m.  Treatment Plan Summary: Methamphetamine induced psychosis: Gabapentin 300 mg TID Follow up with appointment at [facility] tomorrow  Disposition: No evidence of imminent risk to self or others at present.   Patient does not meet criteria for psychiatric inpatient admission.  [facility], NP 12/07/2021 11:52 AM

## 2021-12-07 NOTE — ED Notes (Signed)
Pt observed speaking to what she reports are hallucinations that are "see through people that no one can see."

## 2021-12-07 NOTE — ED Notes (Signed)
IVC/pending psych consult 

## 2021-12-07 NOTE — BH Assessment (Signed)
Comprehensive Clinical Assessment (CCA) Note  12/07/2021 Bailey Cabrera [id number] Recommendations for Services/Supports/Treatments: Consulted with Rashaun D., NP, who recommended pt. for overnight observation and reassessment in the AM. Notified Dr. Lenard Lance and Norina Buzzard, RN of disposition recommendation.   Bailey Cabrera is a 32 year old, English speaking, Caucasian female with a PMH of bipolar d/o, anxiety, and depression.  Pt came in voluntarily but was later IVC'd. Per EDP note: Pt brought in voluntarily by [employer]. With c/o of hallucinations and not feeling safe. Pt endorses auditory hallucinations but can't make out what they are saying. Pt says she's seeing things too, says "things are just moving". Pt fearful in triage.  Upon assessment, pt. was standing in the room doorway, with a fearful facial expression, visibly in distress. Pt endorsed worsening auditory and visual hallucinations. Pt stated, "They follow me. I'm seeing clear people that are computer operated!" Pt reported hearing voices that are whispering to her. Pt admitted to meth use on 12/06/21. Pt positive for paranoid delusions and repeated several times that she is not safe. Pt explained that she'd snitched on someone, unknowingly, and that people are after her. Pt was resistant and uncooperative. Pt had poor attention/concentration and focused on irrelevancies throughout the assessment. Pt had no insight and impaired judgment. Pt was unable to answer assessment questions and struggled to comply with directives. Pt had absent reality testing. Pt avoided eye contact and had restless psychomotor activity. Pt had an anxious mood and a congruent affect. Pt denied current SI/HI. Pt's BAL is unremarkable/UDS is pending. Pt continued to actively hallucinate.  Chief Complaint:  Chief Complaint  Patient presents with   Psychiatric Evaluation   Visit Diagnosis: Polysubstance abuse (HCC) Active Problems:   Bipolar 1  disorder (HCC)  CCA Screening, Triage and Referral (STR)  Patient Reported Information How did you hear about [place]? Self  Referral name: No data recorded Referral phone number: No data recorded  Whom do you see for routine medical problems? No data recorded Practice/Facility Name: No data recorded Practice/Facility Phone Number: No data recorded Name of Contact: No data recorded Contact Number: No data recorded Contact Fax Number: No data recorded Prescriber Name: No data recorded Prescriber Address (if known): No data recorded  What Is the Reason for Your Visit/Call Today? Pt brought in voluntarily by [employer]. With c/o of hallucinations and not feeling safe. Pt endorses auditory hallucinations but cant make out what they are saying. Pt says shes seeing things too, says "things are just moving". Pt fearful in triage.  How Long Has This Been Causing You Problems? 1 wk - 1 month  What Do You Feel Would Help You the Most Today? Medication(s); Treatment for Depression or other mood problem   Have You Recently Been in Any Inpatient Treatment (Hospital/Detox/Crisis Center/28-Day Program)? No data recorded Name/Location of Program/Hospital:No data recorded How Long Were You There? No data recorded When Were You Discharged? No data recorded  Have You Ever Received Services From [facility] Before? No data recorded Who Do You See at [facility]? No data recorded  Have You Recently Had Any Thoughts About Hurting Yourself? No  Are You Planning to Commit Suicide/Harm Yourself At This time? No   Have you Recently Had Thoughts About Hurting Someone Karolee Ohs? No  Explanation: No data recorded  Have You Used Any Alcohol or Drugs in the Past 24 Hours? No  How Long Ago Did You Use Drugs or Alcohol? No data recorded What Did You Use and How Much? No  data recorded  Do You Currently Have a Therapist/Psychiatrist? No  Name of Therapist/Psychiatrist: No data recorded  Have You Been  Recently Discharged From Any Office Practice or Programs? No  Explanation of Discharge From Practice/Program: No data recorded    CCA Screening Triage Referral Assessment Type of Contact: Face-to-Face  Is this Initial or Reassessment? No data recorded Date Telepsych consult ordered in [facility]:  No data recorded Time Telepsych consult ordered in [facility]:  No data recorded  Patient Reported Information Reviewed? No data recorded Patient Left Without Being Seen? No data recorded Reason for Not Completing Assessment: No data recorded  Collateral Involvement: None provided   Does Patient Have a [place]? No data recorded Name and Contact of Legal Guardian: No data recorded If Minor and Not Living with Parent(s), Who has Custody? n/a  Is CPS involved or ever been involved? Never  Is APS involved or ever been involved? Never   Patient Determined To Be At Risk for Harm To Self or Others Based on Review of Patient Reported Information or Presenting Complaint? No  Method: No data recorded Availability of Means: No data recorded Intent: No data recorded Notification Required: No data recorded Additional Information for Danger to Others Potential: No data recorded Additional Comments for Danger to Others Potential: No data recorded Are There Guns or Other Weapons in Your Home? No data recorded Types of Guns/Weapons: No data recorded Are These Weapons Safely Secured?                            No data recorded Who Could Verify You Are Able To Have These Secured: No data recorded Do You Have any Outstanding Charges, Pending Court Dates, Parole/Probation? No data recorded Contacted To Inform of Risk of Harm To Self or Others: Other: Comment   Location of Assessment: [facility] ED   Does Patient Present under Involuntary Commitment? Yes  IVC Papers Initial File Date: 12/07/21   [place] of Residence: [place]   Patient Currently Receiving the Following Services: Not  Receiving Services   Determination of Need: Emergent (2 hours)   Options For Referral: ED Visit; Therapeutic Triage Services     CCA Biopsychosocial Intake/Chief Complaint:  No data recorded Current Symptoms/Problems: No data recorded  Patient Reported Schizophrenia/Schizoaffective Diagnosis in Past: Yes   Strengths: Pt is able to ask for help  Preferences: No data recorded Abilities: No data recorded  Type of Services Patient Feels are Needed: No data recorded  Initial Clinical Notes/Concerns: No data recorded  Mental Health Symptoms Depression:   None   Duration of Depressive symptoms: No data recorded  Mania:   Irritability; Change in energy/activity; Racing thoughts   Anxiety:    Tension; Worrying; Difficulty concentrating   Psychosis:   Delusions; Hallucinations   Duration of Psychotic symptoms:  Greater than six months   Trauma:   Hypervigilance   Obsessions:   Cause anxiety; Disrupts routine/functioning; Good insight; Intrusive/time consuming; Recurrent & persistent thoughts/impulses/images   Compulsions:   "Driven" to perform behaviors/acts; Disrupts with routine/functioning; Intended to reduce stress or prevent another outcome; Intrusive/time consuming; Repeated behaviors/mental acts   Inattention:   None   Hyperactivity/Impulsivity:   None   Oppositional/Defiant Behaviors:   N/A   Emotional Irregularity:   Transient, stress-related paranoia/disassociation; Mood lability   Other Mood/Personality Symptoms:  No data recorded   Mental Status Exam Appearance and self-care  Stature:   Average   Weight:   Overweight  Clothing:   -- (In scrubs)   Grooming:   Neglected   Cosmetic use:   None   Posture/gait:   Tense   Motor activity:   Repetitive   Sensorium  Attention:  No data recorded  Concentration:   Anxiety interferes   Orientation:   Object; Person; Place; Situation   Recall/memory:   Normal   Affect and Mood   Affect:   Anxious   Mood:   Anxious   Relating  Eye contact:   Fleeting   Facial expression:   Anxious; Fearful   Attitude toward examiner:   Resistant   Thought and Language  Speech flow:  Clear and Coherent   Thought content:   Delusions   Preoccupation:   Ruminations   Hallucinations:   Auditory; Visual   Organization:  No data recorded  [employer] of Knowledge:   Fair   Intelligence:   Below average   Abstraction:   Concrete   Judgement:   Poor   Reality Testing:   Unaware   Insight:   Unaware   Decision Making:   Vacilates   Social Functioning  Social Maturity:   Irresponsible; Impulsive   Social Judgement:   Impropriety   Stress  Stressors:   Other (Comment) (Active psychosis)   Coping Ability:   Overwhelmed   Skill Deficits:   Decision making; Self-care; Self-control   Supports:   Support needed     Religion: Religion/Spirituality Are You A Religious Person?: No  Leisure/Recreation: Leisure / Recreation Do You Have Hobbies?: No  Exercise/Diet: Exercise/Diet Do You Exercise?: No Have You Gained or Lost A Significant Amount of Weight in the Past Six Months?: No Do You Follow a Special Diet?: No Do You Have Any Trouble Sleeping?: Yes Explanation of Sleeping Difficulties: Pt has not slept in atleast 2 days.   CCA Employment/Education Employment/Work Situation: Employment / Work Situation Employment Situation: Unemployed Patient's Job has Been Impacted by Current Illness: No Has Patient ever Been in the [employer]?: No  Education: Education Is Patient Currently Attending School?:  (UTA) Did You Attend College?:  (UTA) Did You Have An Individualized Education Program (IIEP):  (UTA) Did You Have Any Difficulty At School?:  (UTA) Patient's Education Has Been Impacted by Current Illness:  (UTA)   CCA Family/Childhood History Family and Relationship History: Family history Marital status:  Married Number of Years Married:  (UTA) What types of issues is patient dealing with in the relationship?: UTA Additional relationship information: UTA Does patient have children?:  (UTA)  Childhood History:  Childhood History By whom was/is the patient raised?:  (UTA) Did patient suffer any verbal/emotional/physical/sexual abuse as a child?:  (UTA) Did patient suffer from severe childhood neglect?:  (UTA) Has patient ever been sexually abused/assaulted/raped as an adolescent or adult?:  (UTA) Was the patient ever a victim of a crime or a disaster?:  (UTA) Witnessed domestic violence?:  (UTA) Has patient been affected by domestic violence as an adult?:  Industrial/product designer)  Child/Adolescent Assessment:     CCA Substance Use Alcohol/Drug Use: Alcohol / Drug Use Pain Medications: See MAR Prescriptions: See MAR Over the Counter: See MAR History of alcohol / drug use?: Yes Longest period of sobriety (when/how long): UTA Negative Consequences of Use: Personal relationships, Legal Withdrawal Symptoms:  (UTA) Substance #1 Name of Substance 1: Meth 1 - Age of First Use: UTA 1 - Amount (size/oz): UTA 1 - Frequency: UTA 1 - Duration: Ongoing 1 - Last Use / Amount: 12/06/21  ASAM's:  Six Dimensions of Multidimensional Assessment  Dimension 1:  Acute Intoxication and/or Withdrawal Potential:   Dimension 1:  Description of individual's past and current experiences of substance use and withdrawal: Pt has a hx of polysubstance abuse  Dimension 2:  Biomedical Conditions and Complications:   Dimension 2:  Description of patient's biomedical conditions and  complications: n/a  Dimension 3:  Emotional, Behavioral, or Cognitive Conditions and Complications:  Dimension 3:  Description of emotional, behavioral, or cognitive conditions and complications: Pt has a hx of schizoprhrenia and bipolar disorder  Dimension 4:  Readiness to Change:  Dimension 4:  Description of  Readiness to Change criteria: Pt expressed no desire to change due to being acutely psychotic  Dimension 5:  Relapse, Continued use, or Continued Problem Potential:     Dimension 6:  Recovery/Living Environment:     ASAM Severity Score: ASAM's Severity Rating Score: 10  ASAM Recommended Level of Treatment: ASAM Recommended Level of Treatment: Level I Outpatient Treatment   Substance use Disorder (SUD) Substance Use Disorder (SUD)  Checklist Symptoms of Substance Use: Evidence of tolerance, Continued use despite persistent or recurrent social, interpersonal problems, caused or exacerbated by use, Continued use despite having a persistent/recurrent physical/psychological problem caused/exacerbated by use, Recurrent use that results in a failure to fulfill major role obligations (work, school, home)  Recommendations for Services/Supports/Treatments: Recommendations for Services/Supports/Treatments Recommendations For Services/Supports/Treatments: SAIOP (Substance Abuse Intensive Outpatient Program), Peer Support, Individual Therapy, Detox  DSM5 Diagnoses: Patient Active Problem List   Diagnosis Date Noted   Polysubstance abuse (HCC) 12/07/2021   Lower abdominal pain 02/03/2016   Marijuana use 07/20/2014   Herpes genitalis 07/20/2014   Anxiety 07/20/2014   Depression 07/20/2014   Bipolar 1 disorder (HCC) 07/20/2014    Jaidyn Kuhl R Cull[place], LCAS

## 2021-12-07 NOTE — ED Provider Notes (Signed)
   [facility] Provider Note    Event Date/Time   First MD Initiated Contact with Patient 12/07/21 [phone]     (approximate)  History   Chief Complaint: Psychiatric Evaluation  HPI  Bailey Cabrera is a 31 y.o. female with a past medical history of bipolar, schizophrenia, substance abuse, presents emergency department for psychosis.  According to the patient she has been hearing and seeing things, patient is paranoid states someone is trying to kill her.  Patient is looking around the room, states she is scared to be in the room alone.  Patient does admit to methamphetamine use including yesterday but denies any today per patient.  Denies alcohol use.  Physical Exam   Triage Vital Signs: ED Triage Vitals  Enc Vitals Group     BP 12/07/21 0305 (!) 128/92     Pulse Rate 12/07/21 0305 (!) 128     Resp 12/07/21 0305 18     Temp 12/07/21 0305 98.7 F (37.1 C)     Temp Source 12/07/21 0305 Oral     SpO2 12/07/21 0305 94 %     Weight 12/07/21 0323 180 lb (81.6 kg)     Height 12/07/21 0323 5\' 1"  (1.549 m)     Head Circumference --      Peak Flow --      Pain Score 12/07/21 0323 0     Pain Loc --      Pain Edu? --      Excl. in [place]? --     Most recent vital signs: Vitals:   12/07/21 0305 12/07/21 0307  BP: (!) 128/92   Pulse: (!) 128 (!) 119  Resp: 18   Temp: 98.7 F (37.1 C)   SpO2: 94%     General: Awake, no distress.  Does seem somewhat paranoid. CV:  Good peripheral perfusion.  Regular rate and rhythm  Resp:  Normal effort.  Equal breath sounds bilaterally.  Abd:  No distention.  Soft, nontender.  No rebound or guarding.   ED Results / Procedures / Treatments    MEDICATIONS ORDERED IN ED: Medications - No data to display   IMPRESSION / MDM / [place] / ED COURSE  I reviewed the triage vital signs and the nursing notes.  Patient's presentation is most consistent with acute presentation with potential threat to life or bodily  function.  Patient presents to the emergency department for paranoia believes someone is trying to hurt or kill her.  Patient is scared to go into the room because she thinks someone might try to hurt her.  Patient states she has been hearing and seeing things.  Patient does admit to methamphetamine use.  Given the patient's acute psychosis we will place the patient under an involuntary commitment until the patient can be adequately evaluated by psychiatry.  We will check labs and continue to closely monitor the patient.  We will dose 20 mg of Geodon, currently the patient is willing to take this voluntarily.  Patient is now calm currently resting in her bed.  Lab work shows a normal CBC, reassuring chemistry, negative COVID/flu, negative ethanol.  FINAL CLINICAL IMPRESSION(S) / ED DIAGNOSES   Acute psychosis Substance abuse  Note:  This document was prepared using Dragon voice recognition software and may include unintentional dictation errors.   Harvest Dark, MD 12/07/21 [phone]

## 2021-12-07 NOTE — Consult Note (Addendum)
[facility] Consult   Reason for Consult:  Psych Evaluation Referring Physician:  Dr. Luis Abed Patient Identification: Bailey Cabrera MRN:  [id number] Principal Diagnosis: Polysubstance abuse [facility]) Diagnosis:  Principal Problem:   Polysubstance abuse ([place]) Active Problems:   Bipolar 1 disorder ([place])   Total Time spent with patient: 45 minutes  Subjective:  "They're trying to kill me"   HPI:  Psych Assessment  Bailey Cabrera, 31 y.o., female patient seen  by TTS and this provider; chart reviewed and consulted with Dr. Luis Abed on 12/07/21.  Per triage note, Pt brought in voluntarily by [employer]. With c/o of hallucinations and not feeling safe. Pt endorses auditory hallucinations but cant make out what they are saying. Pt says shes seeing things too, says "things are just moving". Pt fearful in triage.  Denies SI, HI, CO, SOB.   During evaluation Bailey Cabrera is in her room door screaming that someone is going to kill her.  She is inconsolable and has to be medicated.  She is making statement about the "clear people" being all around her. At this time patent is psychotic and delusional.  Unfortunately a UDS is unattaniable and therefore unable to state if psychosis is drug induced or  organic. Patient, however does admit to using meth yesterday.    Recommendations:  Reassess in the morning to rule out possible meth induced psychosis  Past Psychiatric History: Schizophrenia, polysubstance abuse  Risk to Self:   Risk to Others:   Prior Inpatient Therapy:   Prior Outpatient Therapy:    Past Medical History:  Past Medical History:  Diagnosis Date   Anemia    Anxiety and depression    Asthma    Bipolar 1 disorder ([place])    Drug use    Herpes genitalis 07/20/2014   Has been tx'd since 32weeks for suppresion   Hypotension    Marijuana use    Schizophrenia ([place]) 2022   diagnosed while in prision in 2022   Seizures ([place])    During  pregnancy; most recent April 2016    Past Surgical History:  Procedure Laterality Date   sonohystogram  2012   TUBAL LIGATION N/A 02/29/2016   Procedure: POST PARTUM TUBAL LIGATION;  Surgeon: Will Bonnet, MD;  Location: [facility] ORS;  Service: Gynecology;  Laterality: N/A;   WISDOM TOOTH EXTRACTION Bilateral 2011   Post op infection   Family History: No family history on file. Family Psychiatric  History:  Social History:  Social History   Substance and Sexual Activity  Alcohol Use No     Social History   Substance and Sexual Activity  Drug Use Yes   Types: Marijuana, Cocaine   Comment: Heroin last use 07/22/2019, marijuana last use 07/22/2019, meth last use 2 weeks ago    Social History   Socioeconomic History   Marital status: Married    Spouse name: Not on file   Number of children: 4   Years of education: Not on file   Highest education level: Not on file  Occupational History   Not on file  Tobacco Use   Smoking status: Every Day    Packs/day: 1.50    Years: 5.00    Total pack years: 7.50    Types: Cigarettes   Smokeless tobacco: Never  Vaping Use   Vaping Use: Never used  Substance and Sexual Activity   Alcohol use: No   Drug use: Yes    Types: Marijuana, Cocaine    Comment: Heroin  last use 07/22/2019, marijuana last use 07/22/2019, meth last use 2 weeks ago   Sexual activity: Not Currently    Birth control/protection: Surgical  Other Topics Concern   Not on file  Social History Narrative   Not on file   Social Determinants of Health   Financial Resource Strain: Not on file  Food Insecurity: Not on file  Transportation Needs: Not on file  Physical Activity: Not on file  Stress: Not on file  Social Connections: Not on file   Additional Social History:    Allergies:   Allergies  Allergen Reactions   Penicillins Hives and Swelling    Throat swells.   Sulfamethoxazole-Trimethoprim Nausea Only   Amoxicillin Itching, Nausea Only and Rash     Labs:  Results for orders placed or performed during the hospital encounter of 12/07/21 (from the past 48 hour(s))  Comprehensive metabolic panel     Status: Abnormal   Collection Time: 12/07/21  3:27 AM  Result Value Ref Range   Sodium 141 135 - 145 mmol/L   Potassium 3.1 (L) 3.5 - 5.1 mmol/L   Chloride 105 98 - 111 mmol/L   CO2 24 22 - 32 mmol/L   Glucose, Bld 145 (H) 70 - 99 mg/dL    Comment: Glucose reference range applies only to samples taken after fasting for at least 8 hours.   BUN 14 6 - 20 mg/dL   Creatinine, Ser 4.12 0.44 - 1.00 mg/dL   Calcium 9.3 8.9 - 87.8 mg/dL   Total Protein 8.9 (H) 6.5 - 8.1 g/dL   Albumin 4.3 3.5 - 5.0 g/dL   AST 48 (H) 15 - 41 U/L   ALT 85 (H) 0 - 44 U/L   Alkaline Phosphatase 89 38 - 126 U/L   Total Bilirubin 0.7 0.3 - 1.2 mg/dL   GFR, Estimated >67 >67 mL/min    Comment: (NOTE) Calculated using the CKD-EPI Creatinine Equation (2021)    Anion gap 12 5 - 15    Comment: Performed at [facility], [address] [place], [place] [address]  Ethanol     Status: None   Collection Time: 12/07/21  3:27 AM  Result Value Ref Range   Alcohol, Ethyl (B) <10 <10 mg/dL    Comment: (NOTE) Lowest detectable limit for serum alcohol is 10 mg/dL.  For medical purposes only. Performed at [facility], [address] [place], [place] [address]   Salicylate level     Status: Abnormal   Collection Time: 12/07/21  3:27 AM  Result Value Ref Range   Salicylate Lvl <7.0 (L) 7.0 - 30.0 mg/dL    Comment: Performed at [facility], [address] [place], [place] [address]  Acetaminophen level     Status: Abnormal   Collection Time: 12/07/21  3:27 AM  Result Value Ref Range   Acetaminophen (Tylenol), Serum <10 (L) 10 - 30 ug/mL    Comment: (NOTE) Therapeutic concentrations vary significantly. A range of 10-30 ug/mL  may be an effective concentration for many patients. However, some  are best treated at concentrations  outside of this range. Acetaminophen concentrations >150 ug/mL at 4 hours after ingestion  and >50 ug/mL at 12 hours after ingestion are often associated with  toxic reactions.  Performed at [facility], [address] [place], [place] [address]   cbc     Status: None   Collection Time: 12/07/21  3:27 AM  Result Value Ref Range   WBC 7.9 4.0 -  10.5 K/uL   RBC 4.53 3.87 - 5.11 MIL/uL   Hemoglobin 13.4 12.0 - 15.0 g/dL   HCT 56.4 33.2 - 95.1 %   MCV 89.8 80.0 - 100.0 fL   MCH 29.6 26.0 - 34.0 pg   MCHC 32.9 30.0 - 36.0 g/dL   RDW 88.4 16.6 - 06.3 %   Platelets 341 150 - 400 K/uL   nRBC 0.0 0.0 - 0.2 %    Comment: Performed at [facility], [address], [place], [place] [address]    Current Facility-Administered Medications  Medication Dose Route Frequency Provider Last Rate Last Admin   potassium chloride SA (KLOR-CON M) CR tablet 40 mEq  40 mEq Oral Once Minna Antis, MD       Current Outpatient Medications  Medication Sig Dispense Refill   naloxone (NARCAN) nasal spray 4 mg/0.1 mL Spray intranasally for overdose 1 each 0    Musculoskeletal: Strength & Muscle Tone: within normal limits Gait & Station: normal Patient leans: N/A            Psychiatric Specialty Exam:  Presentation  General Appearance: Bizarre; Disheveled  Eye Contact:Minimal  Speech:Clear and Coherent  Speech Volume:Increased  Handedness:Right   Mood and Affect  Mood:Anxious; Labile; Irritable  Affect:Congruent; Full Range   Thought Process  Thought Processes:Disorganized; Irrevelant  Descriptions of Associations:Circumstantial  Orientation:Full (Time, Place and Person)  Thought Content:Delusions; Paranoid Ideation  History of Schizophrenia/Schizoaffective disorder:Yes  Duration of Psychotic Symptoms:Greater than six months  Hallucinations:Hallucinations: Visual; Auditory  Ideas of Reference:No data recorded Suicidal Thoughts:Suicidal  Thoughts: No  Homicidal Thoughts:Homicidal Thoughts: No   Sensorium  Memory:Immediate Poor; Remote Fair  Judgment:Impaired  Insight:Lacking   Executive Functions  Concentration:Poor  Attention Span:Poor  Recall:Poor  Fund of Knowledge:Poor  Language:Poor   Psychomotor Activity  Psychomotor Activity:Psychomotor Activity: Normal   Assets  Assets:Housing; Social Support   Sleep  Sleep:Sleep: Poor   Physical Exam: Physical Exam Vitals and nursing note reviewed.  Constitutional:      General: She is in acute distress.  HENT:     Head: Normocephalic and atraumatic.     Nose: Nose normal.     Mouth/Throat:     Mouth: Mucous membranes are dry.  Eyes:     Pupils: Pupils are equal, round, and reactive to light.  Pulmonary:     Effort: Pulmonary effort is normal.  Musculoskeletal:        General: Normal range of motion.     Cervical back: Normal range of motion.  Neurological:     Mental Status: She is disoriented.  Psychiatric:        Attention and Perception: She is inattentive. She perceives auditory and visual hallucinations.        Mood and Affect: Mood is anxious and elated. Affect is tearful.        Speech: Speech normal.        Behavior: Behavior is uncooperative, agitated, hyperactive and combative.        Thought Content: Thought content is paranoid and delusional.        Cognition and Memory: Cognition is impaired. Memory is impaired. She exhibits impaired recent memory and impaired remote memory.        Judgment: Judgment is impulsive and inappropriate.    Review of Systems  Psychiatric/Behavioral:  Positive for hallucinations and substance abuse. The patient is nervous/anxious and has insomnia.   All other systems reviewed and are negative.  Blood pressure (!) 128/92, pulse (!) 119, temperature 98.7  F (37.1 C), temperature source Oral, resp. rate 18, height 5\' 1"  (1.549 m), weight 81.6 kg, SpO2 94 %, unknown if currently breastfeeding. Body  mass index is 34.01 kg/m.   Disposition: Supportive therapy provided about ongoing stressors. Refer to IOP. Reassess in the am.   , NP 12/07/2021 4:37 AM

## 2021-12-07 NOTE — ED Notes (Signed)
Pt dressed out with this RN and Chemical engineer, NT  [employer] phone  Black wallet Phone charger Pink underwear Pearline Cables pants White bra Multicolored shirt Multicolored socks Gray ring Hair tie

## 2021-12-07 NOTE — ED Notes (Signed)
IVC / pending reassessment in the AM. 

## 2021-12-07 NOTE — ED Provider Notes (Signed)
31 year old female seen for polysubstance abuse and likely substance-induced mood disorder.  She has been cleared by psychiatry.  On my assessment she is stable.  She has a small area of likely cellulitis around follicle versus scratch mark on her left lower leg.  There is no fluctuance or evidence of abscess.  We will start her on mupirocin as well as doxycycline given her history of MRSA.  She does not appear systemically to have infection and does not meet sepsis criteria.   Duffy Bruce, MD 12/07/21 1301

## 2021-12-07 NOTE — ED Triage Notes (Addendum)
Pt brought in voluntarily by [employer]. With c/o of hallucinations and not feeling safe. Pt endorses auditory hallucinations but cant make out what they are saying. Pt says shes seeing things too, says "things are just moving". Pt fearful in triage.  Denies SI, HI, CO, SOB

## 2021-12-07 NOTE — ED Notes (Signed)
Pt given back belongings and instructed she is ok to get dressed.

## 2022-01-12 ENCOUNTER — Emergency Department
Admit: 2022-01-12 | Discharge: 2022-01-12 | Disposition: A | Discharge status: Home / Self Care | Payer: PRIVATE HEALTH INSURANCE

## 2022-01-12 ENCOUNTER — Ambulatory Visit
Admit: 2022-01-12 | Discharge: 2022-01-12 | Disposition: A | Discharge status: Home / Self Care | Payer: PRIVATE HEALTH INSURANCE

## 2022-01-12 DIAGNOSIS — R051 Acute cough: Principal | ICD-10-CM

## 2022-01-12 MED ORDER — PROMETHAZINE-DM 6.25 MG-15 MG/5 ML ORAL SYRUP
Freq: Four times a day (QID) | ORAL | 0 refills | 6.00000 days | Status: CP | PRN
Start: 2022-01-12 — End: 2022-01-12

## 2022-02-11 IMAGING — CT CT HEAD W/O CM
5 of 7 series · 17 of 47 positions shown, 19 images · non-contrast
Comparison: CT scan dated 01/21/2013

CLINICAL DATA: Altered mental status. Head trauma.

EXAM:
CT HEAD WITHOUT CONTRAST
CT CERVICAL SPINE WITHOUT CONTRAST
TECHNIQUE: Multidetector CT imaging of the head and cervical spine was
performed following the standard protocol without intravenous
contrast. Multiplanar CT image reconstructions of the cervical spine
were also generated.

[Series 2: head wo · axial · 0.47mm/px · z∈[-148,-53]mm · 5 of 29 slices shown, 7 images (1 of 2)]
[im 5/29  brain]
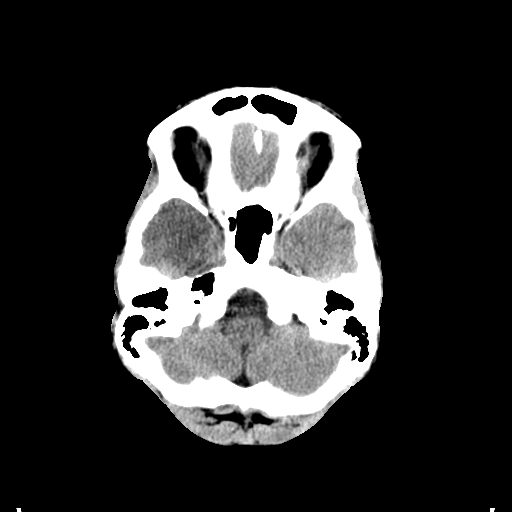
[im 5/29  bone]
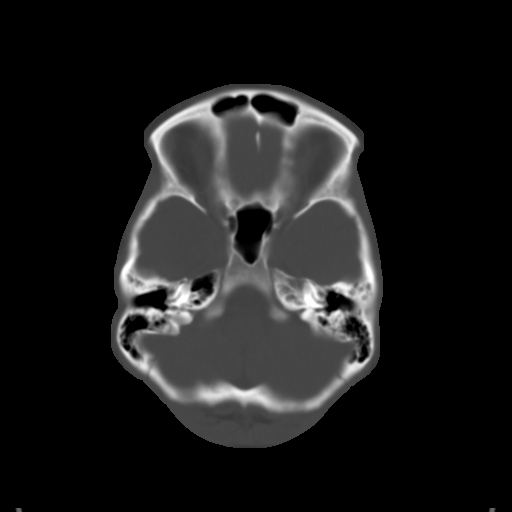
[im 10/29  brain]
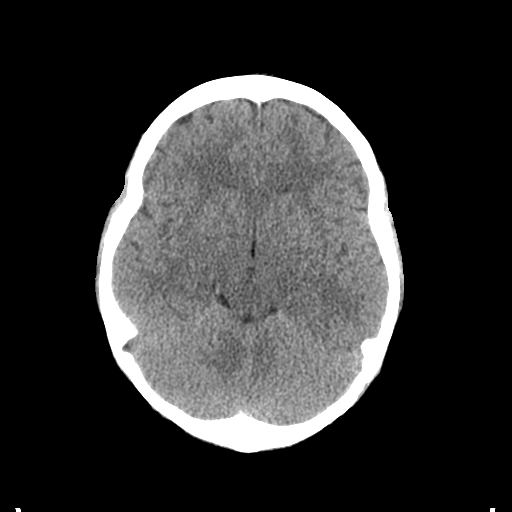
[im 15/29  brain]
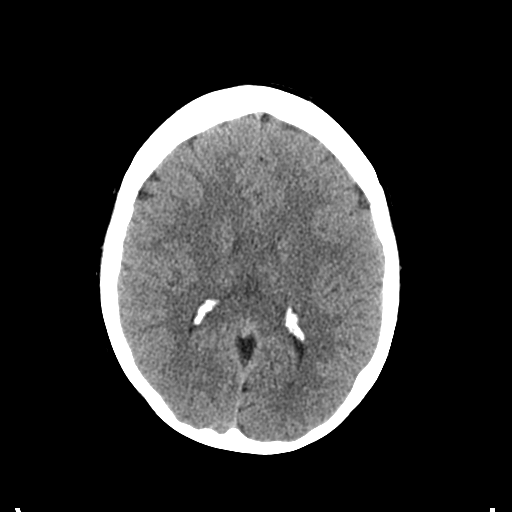
[im 19/29  brain]
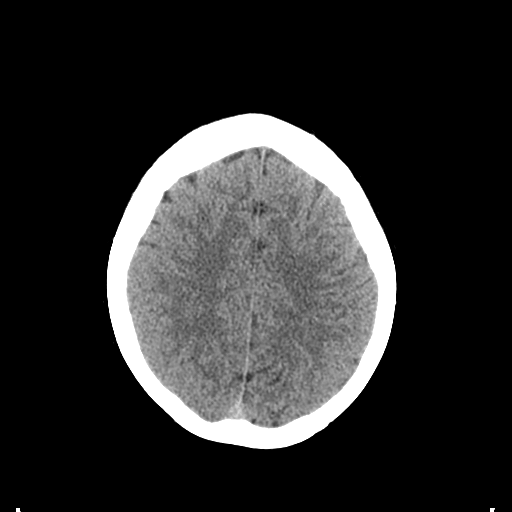
[im 24/29  brain]
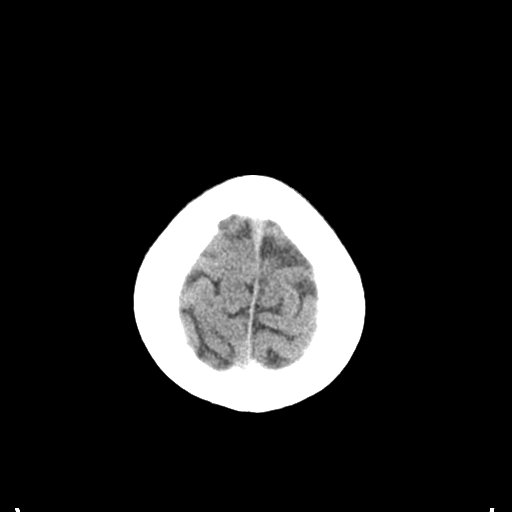
[im 24/29  bone]
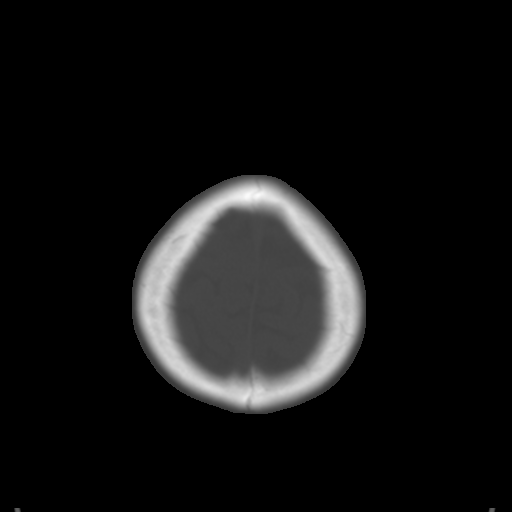

[Series 3: head bone · axial · 0.47mm/px · z∈[-152,-88]mm · 5 of 73 slices shown]
[im 9/73  bone]
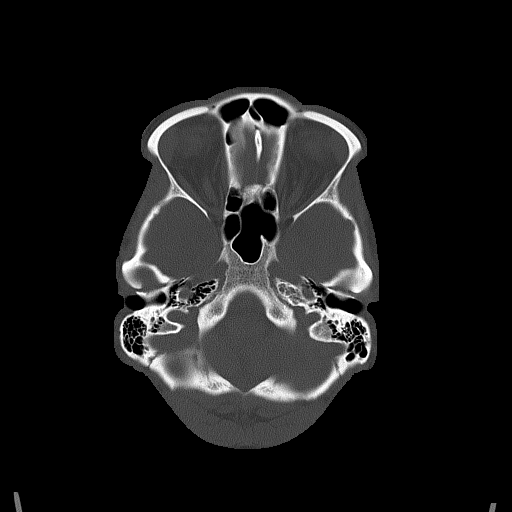
[im 17/73  bone]
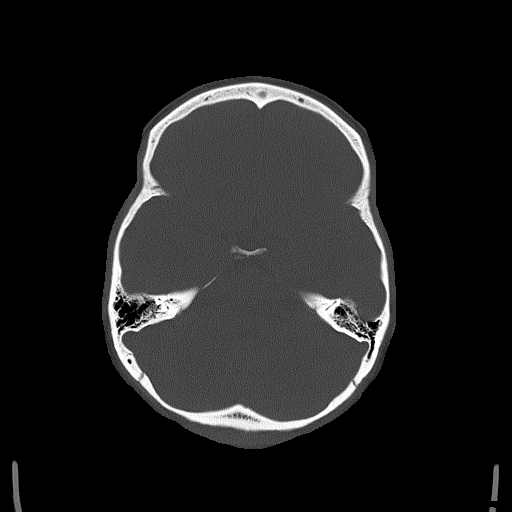
[im 25/73  bone]
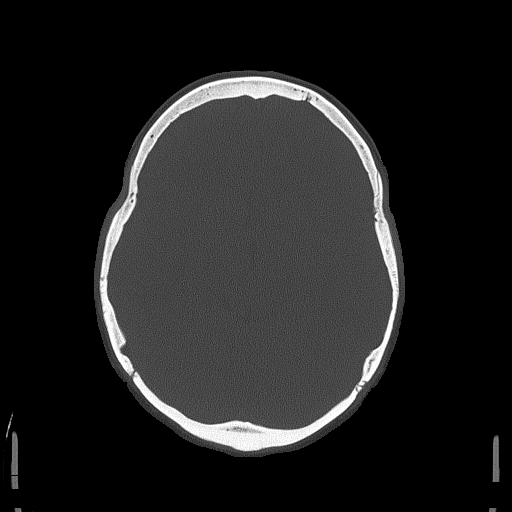
[im 33/73  bone]
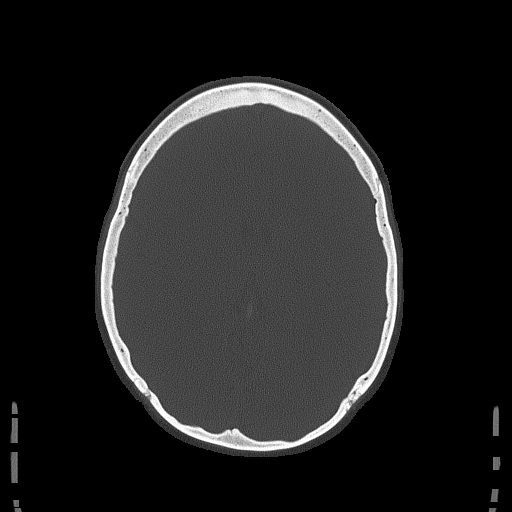
[im 41/73  bone]
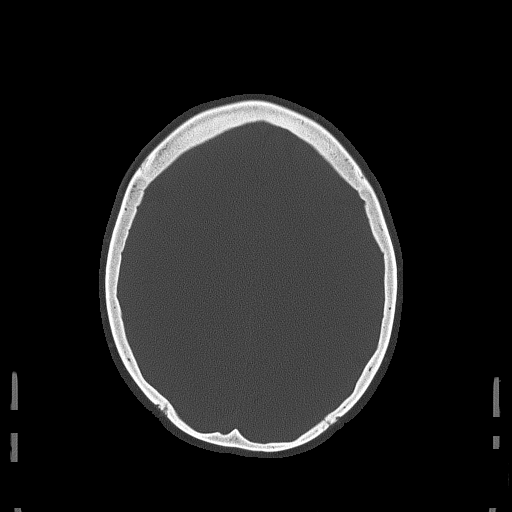

[Series 4: head wo · axial · 0.47mm/px · z∈[-79,-54]mm · 2 of 15 slices shown (2 of 2)]
[im 5/15  brain]
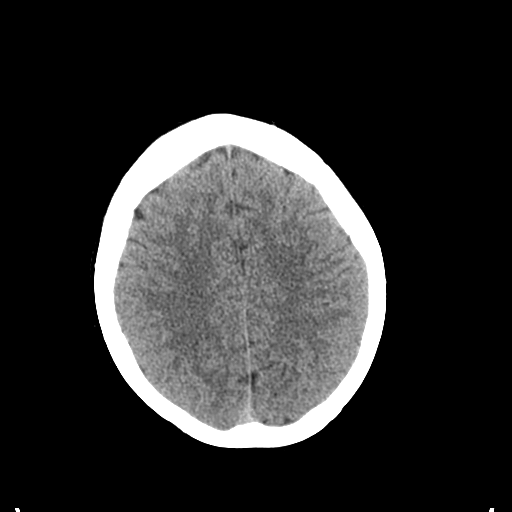
[im 10/15  brain]
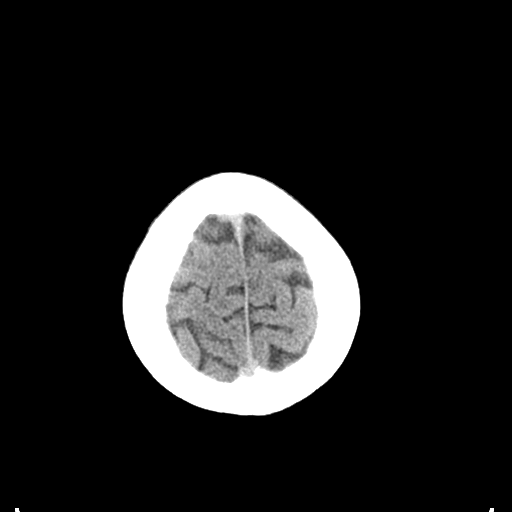

[Series 8: coronal soft tissue · coronal · 0.31mm/px · 3 of 63 slices shown]
[im 16/63  brain]
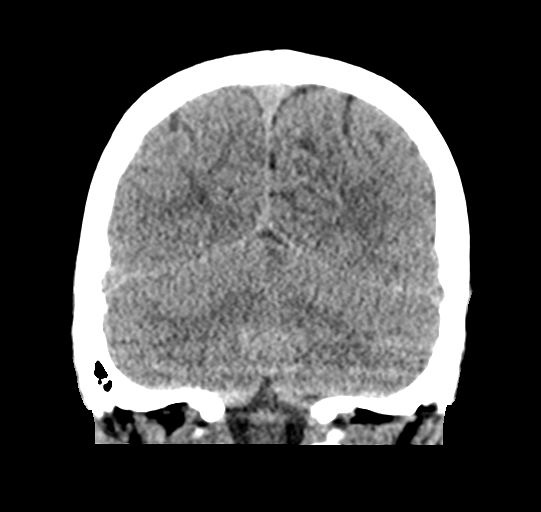
[im 32/63  brain]
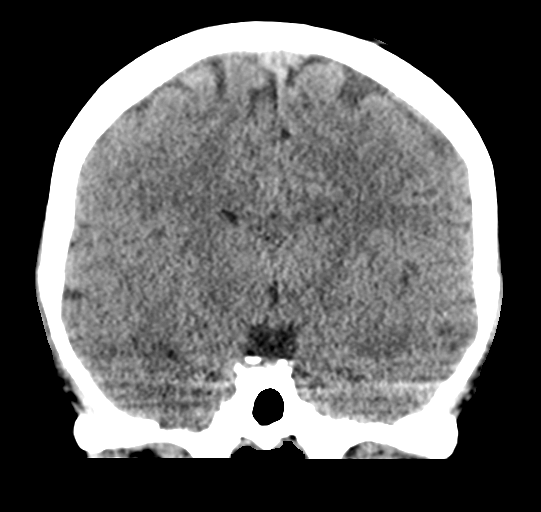
[im 47/63  brain]
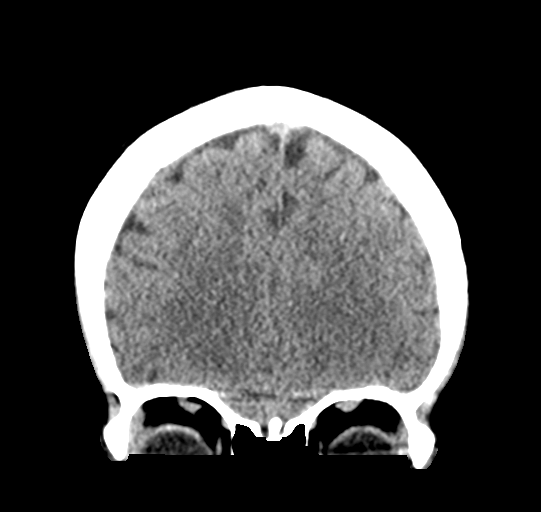

[Series 9: sagittal soft tissue · sagittal · 0.31mm/px · 2 of 56 slices shown]
[im 19/56  brain]
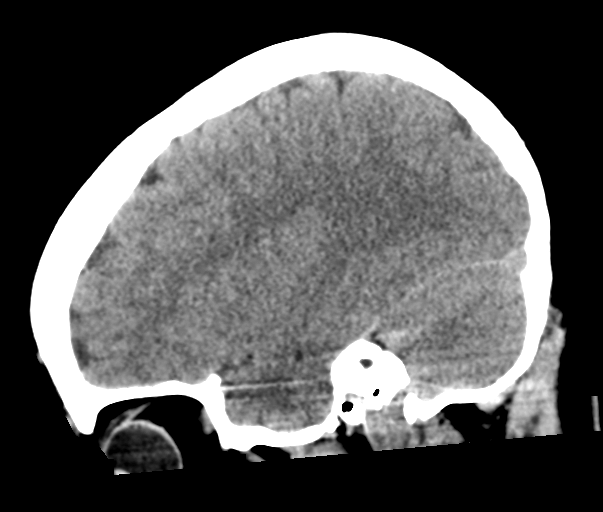
[im 37/56  brain]
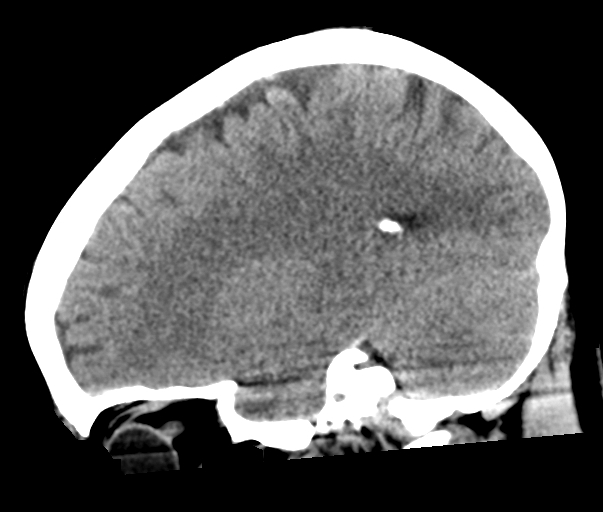

[17 of 47 positions shown; findings below may reference images not displayed]

FINDINGS: CT HEAD FINDINGS

Brain: No evidence of acute infarction, hemorrhage, hydrocephalus,
extra-axial collection or mass lesion/mass effect.

Vascular: No hyperdense vessel or unexpected calcification.

Skull: Normal. Negative for fracture or focal lesion.

Sinuses/Orbits: Slight mucosal thickening in the ethmoid air cells.
Otherwise negative.

Other: None

CT CERVICAL SPINE FINDINGS

Alignment: Normal.

Skull base and vertebrae: No acute fracture. No primary bone lesion
or focal pathologic process.

Soft tissues and spinal canal: No prevertebral fluid or swelling. No
visible canal hematoma.

Disc levels:  The discs throughout the cervical spine are normal.

Upper chest: Normal.

Other: None
IMPRESSION: 1. Negative CT scan of the head.
2. Negative CT scan of the cervical spine.

## 2022-03-02 ENCOUNTER — Ambulatory Visit
Admit: 2022-03-02 | Discharge: 2022-03-02 | Disposition: A | Discharge status: Home / Self Care | Payer: MEDICAID | Attending: Student in an Organized Health Care Education/Training Program

## 2022-03-02 DIAGNOSIS — J101 Influenza due to other identified influenza virus with other respiratory manifestations: Principal | ICD-10-CM

## 2022-03-02 MED ORDER — OSELTAMIVIR 75 MG CAPSULE
ORAL_CAPSULE | Freq: Two times a day (BID) | ORAL | 0 refills | 5.00000 days | Status: CP
Start: 2022-03-02 — End: 2022-03-02

## 2022-03-22 ENCOUNTER — Ambulatory Visit
Admit: 2022-03-22 | Discharge: 2022-03-24 | Disposition: A | Discharge status: Home / Self Care | Payer: MEDICAID | Admitting: Student in an Organized Health Care Education/Training Program

## 2022-03-24 MED ORDER — DOXYCYCLINE HYCLATE 100 MG TABLET
ORAL_TABLET | Freq: Two times a day (BID) | ORAL | 0 refills | 7 days | Status: CP
Start: 2022-03-24 — End: 2022-03-31

## 2022-03-27 ENCOUNTER — Ambulatory Visit
Admit: 2022-03-27 | Discharge: 2022-03-27 | Disposition: A | Discharge status: Home / Self Care | Payer: MEDICAID | Attending: Student in an Organized Health Care Education/Training Program

## 2022-03-27 DIAGNOSIS — L03116 Cellulitis of left lower limb: Principal | ICD-10-CM

## 2022-03-27 MED ORDER — CEFDINIR 300 MG CAPSULE
ORAL_CAPSULE | Freq: Every day | ORAL | 0 refills | 10 days | Status: CP
Start: 2022-03-27 — End: 2022-04-06

## 2022-03-27 MED ORDER — ONDANSETRON 4 MG DISINTEGRATING TABLET
ORAL_TABLET | Freq: Three times a day (TID) | ORAL | 0 refills | 3 days | Status: CP | PRN
Start: 2022-03-27 — End: 2022-03-30

## 2022-03-27 MED ORDER — FLUCONAZOLE 150 MG TABLET
ORAL_TABLET | Freq: Once | ORAL | 0 refills | 1 days | Status: CP
Start: 2022-03-27 — End: 2022-03-27

## 2022-03-27 NOTE — ED Provider Notes (Signed)
 [facility] Emergency Department Provider Note    ED Clinical Impression    Final diagnoses:  Cellulitis of left lower extremity (Primary)        Impression, Medical Decision Making, Progress Notes and Critical Care    Impression, Differential Diagnosis and Plan of Care  This is an afebrile, nontoxic-appearing 32 year old female presenting for evaluation of nausea, vomiting, and diarrhea after each dose of doxycycline  that she was prescribed following an admission to the hospital for cellulitis where she was started on a course of vancomycin  and discharged on doxycycline .  On exam patient appears well and has some mild spreading erythema from the lesion on her left leg consistent with the cellulitis she was admitted for.  She reports that the redness has returned and started spreading from the wound again.  In review of patient's chart the surrounding erythema and wound is noted to appear much better today and not overly concerning.  Is likely she has been vomiting up her prescribed doxycycline  and not receiving any consistent antibiotic coverage leading to the mild spreading erythema to return.  Differentials include continued cellulitis with no concerns for systemic infection.  Will administer a 1000 and mild bolus of normal saline, IV Zofran  4 mg, 1 g of ceftriaxone  IV, discontinue her doxycycline  and change her to a course of cefdinir as there is no confirmed infection with MRSA or MRSA history.  Patient is at high risk for MRSA with her IV drug use however with no confirmed culture feels safe to switch her to a cephalosporin which she has tolerated the past due to nausea and vomiting this occurred with doxycycline  as well as with the previous course of Bactrim .  Will avoid clindamycin at this time.  Patient's left inner thigh wound was cultured to determine if MRSA is present.  Patient was given close return precautions and discharged with her course of cefdinir, short  course of Zofran , and a single dose of Diflucan  as patient reported some vaginal itching with no discharge and concerns for yeast infection she has these in the past with antibiotics.    Portions of this record have been created using Scientist, clinical (histocompatibility and immunogenetics). Dictation errors have been sought, but may not have been identified and corrected.  See chart and resident provider documentation for details.  ____________________________________________      History     Reason for Visit Emesis   HPI  Bailey Cabrera is a 32 y.o. female past medical history as listed below with significance for recent hospital admission for cellulitis she received vancomycin  and was discharged on doxycycline .  Patient states that every time she takes the doxycycline  she has been having nausea and vomiting and had some associated diarrhea with this.  She denies any shortness of breath, chest pain, episodes of passing out, and or rash.  Patient states that the wound on her left thigh has improved significantly since her hospital admission however she did notice that there is some mild redness starting to spread from the wound again as she believes she has not been keeping down a in the doxycycline  to treat it.   Past Medical History:  Diagnosis Date  . Asthma   . Herpes, genital   . Lower abdominal pain 02/03/2016  . Pre-eclampsia     Patient Active Problem List  Diagnosis  . Pelvic inflammatory disease (PID)  . Paranoid schizophrenia (CMS-HCC)  . Methamphetamine use disorder, moderate (CMS-HCC)  . Excoriation (skin-picking) disorder  . Cellulitis of left lower extremity  .  Cocaine use disorder (CMS-HCC)  . Tobacco use disorder  . Hepatitis C  . Polysubstance abuse (CMS-HCC)  . Marijuana use  . Genital herpes simplex  . Gastroesophageal reflux disease with esophagitis  . Depression  . Cigarette smoker  . Bipolar 1 disorder (CMS-HCC)  . Anxiety    Past Surgical History:  Procedure Laterality  Date  . TUBAL LIGATION      No current facility-administered medications for this encounter.  Current Outpatient Medications:  .  cefdinir (OMNICEF) 300 MG capsule, Take 2 capsules (600 mg total) by mouth daily for 10 days., Disp: 20 capsule, Rfl: 0 .  doxycycline  (VIBRA -TABS) 100 MG tablet, Take 1 tablet (100 mg total) by mouth every twelve (12) hours for 7 days., Disp: 13 tablet, Rfl: 0 .  fluconazole  (DIFLUCAN ) 150 MG tablet, Take 1 tablet (150 mg total) by mouth once for 1 dose., Disp: 1 tablet, Rfl: 0 .  ondansetron  (ZOFRAN -ODT) 4 MG disintegrating tablet, Take 1 tablet (4 mg total) by mouth every eight (8) hours as needed for nausea for up to 3 days., Disp: 9 tablet, Rfl: 0  Allergies Penicillins and Sulfamethoxazole -trimethoprim   Family History  Problem Relation Age of Onset  . Ovarian cysts Sister   . Ovarian cancer Neg Hx     Social History Social History   Tobacco Use  . Smoking status: Every Day    Current packs/day: 1.00    Types: Cigarettes  . Smokeless tobacco: Never  . Tobacco comments:    1/2-1ppd  Vaping Use  . Vaping Use: Never used  Substance Use Topics  . Alcohol use: No  . Drug use: Yes    Types: Cocaine, Methamphetamines    Comment: 3-4 days ago cocaine      Physical Exam   ED Triage Vitals [03/27/22 0846]  Enc Vitals Group     BP 115/67     Pulse 61     SpO2 Pulse      Resp 16     Temp 36.7 C (98 F)     Temp src      SpO2 100 %     Weight      Height      Head Circumference      Peak Flow      Pain Score      Pain Loc      Pain Edu?      Excl. in GC?     Constitutional: Alert and oriented. Well appearing and in no distress. Eyes: Conjunctivae are normal. ENT      Head: Normocephalic and atraumatic.      Nose: No congestion.      Mouth/Throat: Mucous membranes are moist.      Neck: No stridor. Hematological/Lymphatic/Immunilogical: No cervical lymphadenopathy. Cardiovascular: Normal rate, regular rhythm. Respiratory:  Normal respiratory effort.  Genitourinary: As in HPI, deferred exam. Musculoskeletal: Normal range of motion in all extremities.      Right lower leg: No tenderness or edema.      Left lower leg: No tenderness or edema. Neurologic: Normal speech and language. No gross focal neurologic deficits are appreciated. Skin: Left medial thigh with approximately 2 cm circular ventral ulceration with surrounding erythema that extends approximately 4 cm.  No red streaking.  No purulent discharge noted. Psychiatric: Mood and affect are normal. Speech and behavior are normal.      Lennie Donnice [place], PA 03/27/22 1122

## 2022-07-07 ENCOUNTER — Ambulatory Visit
Admit: 2022-07-07 | Discharge: 2022-07-08 | Payer: MEDICAID | Attending: Hematology & Oncology | Primary: Hematology & Oncology

## 2022-07-07 DIAGNOSIS — Z13220 Encounter for screening for lipoid disorders: Principal | ICD-10-CM

## 2022-07-07 DIAGNOSIS — Z1329 Encounter for screening for other suspected endocrine disorder: Principal | ICD-10-CM

## 2022-07-07 DIAGNOSIS — Z131 Encounter for screening for diabetes mellitus: Principal | ICD-10-CM

## 2022-07-07 DIAGNOSIS — F319 Bipolar disorder, unspecified: Principal | ICD-10-CM

## 2022-07-07 DIAGNOSIS — Z7689 Persons encountering health services in other specified circumstances: Principal | ICD-10-CM

## 2022-07-07 DIAGNOSIS — F17200 Nicotine dependence, unspecified, uncomplicated: Principal | ICD-10-CM

## 2022-07-07 DIAGNOSIS — F200 Paranoid schizophrenia: Principal | ICD-10-CM

## 2022-07-07 DIAGNOSIS — B182 Chronic viral hepatitis C: Principal | ICD-10-CM

## 2022-07-07 NOTE — Progress Notes (Signed)
 Encounter to establish care Patient presents to office to establish care.   Chronic hepatitis C without hepatic coma (CMS-HCC) - 03/22/22 Hep C antibody +, HCV RNA detected HCV RNA 41, 004. HCV RNA 4.61.  -  Mild transaminitis noted 03/27/22 repeat today - Otherwise asymptomatic - Ambulatory referral to Hepatology; Future - Comprehensive Metabolic Panel  Diabetes mellitus screening  - Hemoglobin A1c  Thyroid disorder screen  - TSH  Need for lipid screening  - Lipid Panel  Bipolar 1 disorder (CMS-HCC) - Patient follows with Dr. Daniel and [place] Current tx: Risperidone 1 mg po BID  Paranoid schizophrenia (CMS-HCC) - Patient follows with Dr. Daniel and [facility] Current tx: Risperidone 1 mg po BID  Tobacco use disorder Patient advised to quit all tobacco products.  Patient states she is trying to quit on her own and is down to 6 cigarettes/day.   HM: Patient declined Pneumovax.      No follow-ups on file. I personally spent 40 minutes face-to-face and non-face-to-face in the care of this patient, which includes all pre, intra, and post visit time on the date of service. All documented time was specific to the E/M visit and does not include any procedures that may have been performed.  Subjective:   HPI: Bailey Cabrera is a 32 y.o. female here for Establish Care (Patient states she is fasting and wants to discuss her Hep C. Has a cough for months and sometimes nausea/Pap was a year ago.)   Chronic Hep C: Patient was an IVDU and was checked for HEP C 3 years ago and than [facility]) and was not treated and now wishes to be treated.   Schizophrenia: Patient goes to [place] in [place], [place] Dr. Daniel. Current tx: Risperidol tab BID,  IVDU ( heroin): Patient takes Suboxone.  HM:  Declined pneumovax  ROS negative unless otherwise noted in HPI.  Allergies:   Penicillins and Sulfamethoxazole -trimethoprim   Current  Medications:   Current Outpatient Medications  Medication Sig Dispense Refill  . risperiDONE (RISPERDAL M-TABS) 1 MG disintegrating tablet Dissolve 1 tablet (1 mg total) in the mouth two (2) times a day.    . SUBOXONE 8-2 mg sublingual film 1 Film (8 mg of buprenorphine total) daily.     No current facility-administered medications for this visit.    Objective:   Vitals:   07/07/22 0935  BP: 106/70  Pulse: 56  Temp: 36.2 C (97.1 F)  SpO2: 96%   Body mass index is 27.28 kg/m.  Physical Exam Constitutional:      Appearance: Normal appearance.  HENT:     Head: Normocephalic and atraumatic.     Right Ear: Tympanic membrane, ear canal and external ear normal.     Left Ear: Tympanic membrane, ear canal and external ear normal.     Nose: Nose normal.     Mouth/Throat:     Mouth: Mucous membranes are moist.     Pharynx: Oropharynx is clear.  Eyes:     Extraocular Movements: Extraocular movements intact.     Conjunctiva/sclera: Conjunctivae normal.     Pupils: Pupils are equal, round, and reactive to light.  Cardiovascular:     Rate and Rhythm: Normal rate and regular rhythm.     Heart sounds: Normal heart sounds.  Pulmonary:     Effort: Pulmonary effort is normal.     Breath sounds: Normal breath sounds.  Abdominal:     General: Abdomen is flat. Bowel sounds are  normal.     Palpations: Abdomen is soft.  Musculoskeletal:        General: Normal range of motion.     Cervical back: Normal range of motion and neck supple.  Skin:    General: Skin is warm and dry.     Capillary Refill: Capillary refill takes less than 2 seconds.  Neurological:     General: No focal deficit present.     Mental Status: She is alert and oriented to person, place, and time. Mental status is at baseline.  Psychiatric:        Mood and Affect: Mood normal.        Behavior: Behavior normal.        Thought Content: Thought content normal.        Judgment: Judgment normal.      No results  found for this visit on 07/07/22.  Slater MARLA Sole, M.D.   Internal Medicine [facility] [address] [place], [place] [address]

## 2022-09-23 ENCOUNTER — Ambulatory Visit: Admit: 2022-09-23 | Discharge: 2022-09-24 | Payer: PRIVATE HEALTH INSURANCE

## 2022-09-23 DIAGNOSIS — B182 Chronic viral hepatitis C: Principal | ICD-10-CM

## 2022-09-25 DIAGNOSIS — B182 Chronic viral hepatitis C: Principal | ICD-10-CM

## 2022-09-25 LAB — HEPATITIS C GENOTYPE

## 2022-09-25 MED ORDER — GLECAPREVIR 100 MG-PIBRENTASVIR 40 MG TABLET
ORAL_TABLET | Freq: Every day | ORAL | 0 refills | 56 days | Status: CP
Start: 2022-09-25 — End: ?
  Filled 2022-09-28: qty 84, 28d supply, fill #0

## 2022-09-25 NOTE — Unmapped (Signed)
[facility]   Patient Onboarding/Medication Counseling    Colleen Clarke is a 32 y.o. female with Hepatitis C who I am counseling today on initiation of therapy.  I am speaking to the patient.    Was a Nurse, learning disability used for this call? No    Verified patient's date of birth / HIPAA.    Specialty medication(s) to be sent: Infectious Disease: Mavyret      Non-specialty medications/supplies to be sent: n/a      Medications not needed at this time: n/a         Mavyret (glecaprevir and pibrentasvir) 100mg /40mg     Planned Start Date: 09/28/22    Has the patient been told to call and make a 4 week follow-up appointment after their medicine has been received? Yes, I told her to call Colleen Clarke, CPP at [phone]      Medication & Administration     Dosage: Take three tablets by mouth once daily for 8 weeks.    Administration: Take with food.    Adherence/Missed dose instructions:   Take missed dose with food as soon as you think about it.   If it has been more than 18 hours since the missed dose, skip the missed dose and resume with your next scheduled dose.  Do not take extra doses or 2 doses at the same time.    Goals of Therapy     The goal of therapy is to cure the patient of Hepatitis C. Patients who have undetectable HCV RNA in the serum, as assessed by a sensitive polymerase chain reaction (PCR) assay, ?12 weeks after treatment completion are deemed to have achieved SVR (cure). (www.hcvguidelines.org)     Side Effects & Monitoring Parameters     Common Side Effects:  Headache  Fatigue   Nausea    The following side effects should be reported to the provider:  Signs of liver problems like dark urine, feeling tired, lack of hunger, upset stomach or stomach pain, light-colored stools, throwing up, or yellow skin/eyes.  Signs of an allergic reaction, such as rash; hives; itching; red, swollen, blistered, or peeling skin with or without fever.   If you have wheezing; tightness in the chest or throat; trouble breathing, swallowing, or talking; unusual hoarseness; or swelling of the mouth, face, lips, tongue, or throat, call 911 or go to the closest emergency department (ED).     Monitoring Parameters: (AASLD-IDSA Guidelines)    Within 6 months prior to starting treatment:  Complete blood count (CBC).  International normalized ratio (INR) if clinically indicated.   Hepatic function panel: albumin, total and direct bilirubin, ALT, AST, and Alkaline Phosphatase levels.  Calculated glomerular filtration rate (eGFR).  Pregnancy testing within 1 month prior to starting treatment in females of childbearing age  Any time prior to starting treatment:  Test for HCV genotype.  Assess for hepatic fibrosis.  Quantitative HCV RNA (HCV viral load).  Assess for active HBV coinfection: HBV surface antigen (HBsAg); If HBsAg positive, then should be assessed for whether HBV DNA level meets AASLD criteria for HBV treatment.  Assess for evidence of prior HBV infection and immunity: HBV core antibody (anti-HBc) and HBV surface antibody (anti-HBS).   Assess for HIV coinfection.    Monitoring during treatment:   Diabetics should monitor for signs of hypoglycemia.  Patients on warfarin should monitor for changes in INR.  Pregnancy test monthly in females of childbearing age.  For HBsAg positive patients not already on HBV therapy because  baseline HBV DNA level does not meet treatment criteria:   Initiate prophylactic HBV antiviral therapy OR  Monitor HBV DNA levels monthly during and immediately after Mavyret therapy.    After treatment to document a cure:   Quantitative HCV viral load 12 weeks after completion of therapy.    Contraindications, Warnings, & Precautions     Contraindications: Moderate or severe hepatic impairment (Child-Pugh class B or C); history of hepatic decompensation; coadministration with atazanavir or rifampin.    Disease-related concerns:   Hepatitis B virus reactivation.   Risk of hepatic decompensation/failure in patients with evidence of advanced liver disease.    Drug/Food Interactions     Medication list reviewed in Epic. The patient was instructed to inform the care team before taking any new medications or supplements.   Drug interactions:   Prazosin:  Mavyret can increase levels of prazosin potentially causing an increase in adverse effects such as dizziness.  I cautioned her to get up slowly from a sitting or laying position.  Risperidone: Mavyret can increase levels of risperidone.  I cautioned her to watch for increased sedation.  Encourage minimizing alcohol intake: Denies alcohol    Storage, Handling Precautions, & Disposal     Store in the original container at room temperature.   Store in a dry place. Do not store in a bathroom.   Keep all drugs in a safe place. Keep all drugs out of the reach of children and pets.   Disposal: You should NOT have any Mavyret to throw away.      Current Medications (including OTC/herbals), Comorbidities and Allergies     Current Outpatient Medications   Medication Sig Dispense Refill    glecaprevir-pibrentasvir (MAVYRET) 100-40 mg tablet Take 3 tablets by mouth daily. Take with food. 168 tablet 0    prazosin (MINIPRESS) 1 MG capsule TAKE 1 TO 2 CAPSULES BY MOUTH EVERY NIGHT AT BEDTIME      risperiDONE (RISPERDAL M-TABS) 1 MG disintegrating tablet Dissolve 1 tablet (1 mg total) in the mouth two (2) times a day.      SUBOXONE 8-2 mg sublingual film 1 Film (8 mg of buprenorphine total) daily.       No current facility-administered medications for this visit.       Allergies   Allergen Reactions    Penicillins Rash, Hives, Itching, Nausea Only and Swelling     Throat swells.    Sulfamethoxazole-Trimethoprim Nausea Only       Patient Active Problem List   Diagnosis    Pelvic inflammatory disease (PID)    Paranoid schizophrenia (CMS-HCC)    Methamphetamine use disorder, moderate (CMS-HCC)    Excoriation (skin-picking) disorder    Cellulitis of left lower extremity    Cocaine use disorder (CMS-HCC)    Tobacco use disorder    Hepatitis C    Polysubstance abuse (CMS-HCC)    Marijuana use    Genital herpes simplex    Gastroesophageal reflux disease with esophagitis    Depression    Cigarette smoker    Bipolar 1 disorder (CMS-HCC)    Anxiety       Reviewed and up to date in Epic.    HCV Labs:   Latest Reference Range & Units 03/22/22 08:22   HCV RNA (IU) <=0 IU/mL 41,004 (H)     Appropriateness of Therapy     Acute infections noted within Epic:  No active infections  Patient reported infection: None    Is medication and dose appropriate based on  diagnosis and infection status? Yes    Prescription has been clinically reviewed: Yes      Baseline Quality of Life Assessment      How many days over the past month did your Hepatitis C  keep you from your normal activities? For example, brushing your teeth or getting up in the morning. 0    Financial Information     Medication Assistance provided: None Required    Anticipated copay of $4.00 reviewed with patient. Verified delivery address.    Delivery Information     Scheduled delivery date: 09/28/22    Expected start date: 09/28/22      Medication will be delivered via Same Day Courier to the prescription address in [facility].  This shipment will not require a signature.      Explained the services we provide at [facility] and that each month we would call to set up refills.  Stressed importance of returning phone calls so that we could ensure they receive their medications in time each month.  Informed patient that we should be setting up refills 7-10 days prior to when they will run out of medication.  A pharmacist will reach out to perform a clinical assessment periodically.  Informed patient that a welcome packet, containing information about our pharmacy and other support services, a Notice of Privacy Practices, and a drug information handout will be sent.      The patient or caregiver noted above participated in the development of this care plan and knows that they can request review of or adjustments to the care plan at any time.      Patient or caregiver verbalized understanding of the above information as well as how to contact the pharmacy at [phone] option 4 with any questions/concerns.  The pharmacy is open Monday through Friday 8:30am-4:30pm.  A pharmacist is available 24/7 via pager to answer any clinical questions they may have.    Patient Specific Needs     Does the patient have any physical, cognitive, or cultural barriers? No    Does the patient have adequate living arrangements? (i.e. the ability to store and take their medication appropriately) Yes    Did you identify any home environmental safety or security hazards? No    Patient prefers to have medications discussed with  Patient     Is the patient or caregiver able to read and understand education materials at a high school level or above? Yes    Patient's primary language is  English     Is the patient high risk? No    SOCIAL DETERMINANTS OF HEALTH     At the [facility], we have learned that life circumstances - like trouble affording food, housing, utilities, or transportation can affect the health of many of our patients.   That is why we wanted to ask: are you currently experiencing any life circumstances that are negatively impacting your health and/or quality of life? Patient declined to answer    Social Determinants of Health     Financial Resource Strain: Low Risk  (07/07/2022)    Overall Financial Resource Strain (CARDIA)     Difficulty of Paying Living Expenses: Not hard at all   Internet Connectivity: No Internet connectivity concern identified (07/07/2022)    Internet Connectivity     Do you have access to internet services: Yes     How do you connect to the internet: Personal Device at home     Is  your internet connection strong enough for you to watch video on your device without major problems?: Yes     Do you have enough data to get through the month?: Yes     Does at least one of the devices have a camera that you can use for video chat?: Yes   Food Insecurity: Not on file   Tobacco Use: High Risk (09/23/2022)    Patient History     Smoking Tobacco Use: Every Day     Smokeless Tobacco Use: Never     Passive Exposure: Never   Housing/Utilities: Not on file   Alcohol Use: Not on file   Transportation Needs: Not on file   Substance Use: Not on file   Health Literacy: Not on file   Physical Activity: Not on file   Interpersonal Safety: Unknown (09/25/2022)    Interpersonal Safety     Unsafe Where You Currently Live: Not on file     Physically Hurt by Anyone: Not on file     Abused by Anyone: Not on file   Stress: Not on file   Intimate Partner Violence: Not on file   Depression: Not on file   Social Connections: Not on file       Would you be willing to receive help with any of the needs that you have identified today? Not applicable       Roderic Palau, PharmD  [facility] Specialty Pharmacist

## 2022-09-25 NOTE — Unmapped (Signed)
[facility] Specialty Medication Onboarding    Specialty Medication: MAVYRET 100-40 mg tablet (glecaprevir-pibrentasvir)  Prior Authorization: Not Required   Financial Assistance: No - copay  <$25  Final Copay/Day Supply: $4 / 28    Insurance Restrictions: Yes - max 1 month supply     Notes to Pharmacist:   Credit Card on File: no    The triage team has completed the benefits investigation and has determined that the patient is able to fill this medication at [facility]. Please contact the patient to complete the onboarding or follow up with the prescribing physician as needed.

## 2022-10-05 NOTE — Unmapped (Signed)
Hepatology Clinic Pharmacist Note    Primary Hepatology provider: Annie Sable, NP  Diagnosis: Chronic Hep C  Fibrosis: F0 (4.7 kPa, 178 dB/m) on 09/23/22  Current regimen: Mavyret x 8 wks  Start date: 09/28/22  Pharmacy: [facility] [phone] option #4, then option 4    Pt called to report she started Mavyret on 09/28/22, Monday. Will reach out during TW#4 via phone to check in.    Park Breed, Pharm D., BCPS, BCGP, CPP  [facility]  [address]  [place], [place] [address]  [phone]

## 2022-10-16 NOTE — Unmapped (Signed)
Specialty Medication(s): Mavyret 100-40mg     Colleen Clarke has been dis-enrolled from the [facility] specialty pharmacy services due to the upcoming therapy completion - expected therapy completion date: 11/22/22 .  Her last shipment of Mavyret will be delivered on 10/20/22.    Additional information provided to the patient: n/a    Roderic Palau, PharmD  [facility] Specialty Pharmacist

## 2022-10-16 NOTE — Unmapped (Signed)
[facility] Clinical Assessment & Refill Coordination Note    Colleen Clarke, DOB: October 20, 1990  Phone: [phone] (home)     All above HIPAA information was verified with patient.     Was a Nurse, learning disability used for this call? No    Specialty Medication(s):   Infectious Disease: Mavyret     Current Outpatient Medications   Medication Sig Dispense Refill    glecaprevir-pibrentasvir (MAVYRET) 100-40 mg tablet Take 3 tablets by mouth daily. Take with food. 168 tablet 0    prazosin (MINIPRESS) 1 MG capsule TAKE 1 TO 2 CAPSULES BY MOUTH EVERY NIGHT AT BEDTIME      risperiDONE (RISPERDAL M-TABS) 1 MG disintegrating tablet Dissolve 1 tablet (1 mg total) in the mouth two (2) times a day.      SUBOXONE 8-2 mg sublingual film 1 Film (8 mg of buprenorphine total) daily.       No current facility-administered medications for this visit.        Changes to medications: [place] reports no changes at this time.    Allergies   Allergen Reactions    Penicillins Rash, Hives, Itching, Nausea Only and Swelling     Throat swells.    Sulfamethoxazole-Trimethoprim Nausea Only       Changes to allergies: No    SPECIALTY MEDICATION ADHERENCE     Mavyret 100-40 mg: 10 days of medicine on hand (includes dose for this evening)    Medication Adherence    Patient reported X missed doses in the last month: 0  Specialty Medication: Mavyret 100-40mg   Patient is on additional specialty medications: No  Demonstrates understanding of importance of adherence: yes  Informant: patient  Provider-estimated medication adherence level: good  Patient is at risk for Non-Adherence: No          Specialty medication(s) dose(s) confirmed: Regimen is correct and unchanged.     Are there any concerns with adherence? No    Adherence counseling provided? Not needed    CLINICAL MANAGEMENT AND INTERVENTION      Clinical Benefit Assessment:    Do you feel the medicine is effective or helping your condition? Yes    Clinical Benefit counseling provided? Not needed    Adverse Effects Assessment:    Are you experiencing any side effects? No    Are you experiencing difficulty administering your medicine? No    Quality of Life Assessment:    How many days over the past month did your Hepatitis C  keep you from your normal activities? For example, brushing your teeth or getting up in the morning. 0    Have you discussed this with your provider? Not needed    Acute Infection Status:    Acute infections noted within Epic:  No active infections  Patient reported infection: None    Therapy Appropriateness:    Is therapy appropriate based on current medication list, adverse reactions, adherence, clinical benefit and progress toward achieving therapeutic goals? Yes, therapy is appropriate and should be continued     DISEASE/MEDICATION-SPECIFIC INFORMATION      For Hepatitis C patients (clinical assessment):  Regimen: Mavyret x 8 weeks  Therapy start date: 09/28/22  Completed Treatment Week #: 2  What time of day do you take your medicine? Evening  Pill count over the phone revealed #10 days or 30 tablets which is appropriate.    Are you taking any new OTC or herbal medication? No  Any alcoholic beverages? No  New pregnancy in females  of child bearing age?  No.  Ensure contraception plan is continued at refill.  Do you have a follow up appointment? Park Breed, CPP will reach out during tw#4 to check in.    Hepatitis C: Not Applicable    PATIENT SPECIFIC NEEDS     Does the patient have any physical, cognitive, or cultural barriers? No    Is the patient high risk? No    Did the patient require a clinical intervention? No    Does the patient require physician intervention or other additional services (i.e., nutrition, smoking cessation, social work)? No    SOCIAL DETERMINANTS OF HEALTH     At the [facility], we have learned that life circumstances - like trouble affording food, housing, utilities, or transportation can affect the health of many of our patients.   That is why we wanted to ask: are you currently experiencing any life circumstances that are negatively impacting your health and/or quality of life? Patient declined to answer    Social Determinants of Health     Financial Resource Strain: Low Risk  (07/07/2022)    Overall Financial Resource Strain (CARDIA)     Difficulty of Paying Living Expenses: Not hard at all   Internet Connectivity: No Internet connectivity concern identified (07/07/2022)    Internet Connectivity     Do you have access to internet services: Yes     How do you connect to the internet: Personal Device at home     Is your internet connection strong enough for you to watch video on your device without major problems?: Yes     Do you have enough data to get through the month?: Yes     Does at least one of the devices have a camera that you can use for video chat?: Yes   Food Insecurity: Not on file   Tobacco Use: High Risk (09/23/2022)    Patient History     Smoking Tobacco Use: Every Day     Smokeless Tobacco Use: Never     Passive Exposure: Never   Housing/Utilities: Not on file   Alcohol Use: Not on file   Transportation Needs: Not on file   Substance Use: Not on file   Health Literacy: Not on file   Physical Activity: Not on file   Interpersonal Safety: Unknown (10/16/2022)    Interpersonal Safety     Unsafe Where You Currently Live: Not on file     Physically Hurt by Anyone: Not on file     Abused by Anyone: Not on file   Stress: Not on file   Intimate Partner Violence: Not on file   Depression: Not on file   Social Connections: Not on file       Would you be willing to receive help with any of the needs that you have identified today? Not applicable       SHIPPING     Specialty Medication(s) to be Shipped:   Infectious Disease: Mavyret    Other medication(s) to be shipped: No additional medications requested for fill at this time     Changes to insurance: No    Delivery Scheduled: Yes, Expected medication delivery date: 10/20/22. Medication will be delivered via Next Day Courier to the confirmed prescription address in [facility].    The patient will receive a drug information handout for each medication shipped and additional FDA Medication Guides as required.  Verified that patient has previously received a Conservation officer, historic buildings and a Notice of  Chief Technology Officer.    The patient or caregiver noted above participated in the development of this care plan and knows that they can request review of or adjustments to the care plan at any time.      All of the patient's questions and concerns have been addressed.    Roderic Palau, PharmD   [facility] Specialty Pharmacist

## 2022-10-19 MED FILL — MAVYRET 100 MG-40 MG TABLET: ORAL | 28 days supply | Qty: 84 | Fill #1

## 2022-11-27 ENCOUNTER — Ambulatory Visit: Payer: MEDICAID

## 2023-02-16 ENCOUNTER — Ambulatory Visit: Admit: 2023-02-16 | Discharge: 2023-02-17 | Payer: PRIVATE HEALTH INSURANCE

## 2023-02-16 DIAGNOSIS — B182 Chronic viral hepatitis C: Principal | ICD-10-CM

## 2023-07-08 ENCOUNTER — Ambulatory Visit
Admit: 2023-07-08 | Discharge: 2023-07-09 | Payer: Medicaid (Managed Care) | Attending: Hematology & Oncology | Primary: Hematology & Oncology

## 2023-07-08 DIAGNOSIS — B353 Tinea pedis: Principal | ICD-10-CM

## 2023-07-08 DIAGNOSIS — Z0000 Encounter for general adult medical examination without abnormal findings: Principal | ICD-10-CM

## 2023-07-08 DIAGNOSIS — Z13220 Encounter for screening for lipoid disorders: Principal | ICD-10-CM

## 2023-07-08 DIAGNOSIS — Z1329 Encounter for screening for other suspected endocrine disorder: Principal | ICD-10-CM

## 2023-07-08 DIAGNOSIS — Z131 Encounter for screening for diabetes mellitus: Principal | ICD-10-CM

## 2023-07-08 DIAGNOSIS — Z130 Encounter for screening for diseases of the blood and blood-forming organs and certain disorders involving the immune mechanism: Principal | ICD-10-CM

## 2023-07-08 DIAGNOSIS — R0683 Snoring: Principal | ICD-10-CM

## 2023-07-08 DIAGNOSIS — K581 Irritable bowel syndrome with constipation: Principal | ICD-10-CM

## 2023-07-08 DIAGNOSIS — F17200 Nicotine dependence, unspecified, uncomplicated: Principal | ICD-10-CM

## 2023-07-08 MED ORDER — CLOTRIMAZOLE-BETAMETHASONE 1 %-0.05 % TOPICAL CREAM
1 refills | 0.00 days | Status: CP
Start: 2023-07-08 — End: 2024-07-07

## 2023-07-08 MED ORDER — LINACLOTIDE 72 MCG CAPSULE
ORAL_CAPSULE | Freq: Every day | ORAL | 0 refills | 90.00 days | Status: CP
Start: 2023-07-08 — End: ?

## 2023-07-08 NOTE — Progress Notes (Signed)
 Encounter for annual physical exam    Diabetes mellitus screening   - Hemoglobin A1c  Thyroid disorder screen   - TSH  Need for lipid screening   - Lipid Panel - Comprehensive Metabolic Panel  Tobacco use disorder - Patient continues to smoke and is encourage to quit all tobacco products and methods are discussed for 3 minutes and patient is not ready to quit.    Screening for deficiency anemia - CBC  Irritable bowel syndrome with constipation - Patient states Miralax was not helpful. - linaclotide (LINZESS) 72 mcg capsule; Take 1 capsule (72 mcg total) by mouth daily.  Tinea pedis of both feet - In between both toes are peeling and itching - Patient will be treated for Tinea pedis with lotrisone. - clotrimazole-betamethasone (LOTRISONE) 1-0.05 % cream; Apply to affected area 2 times daily prn  Snoring - Patient states she is snoring and would like a sleep study.  - Polysomnography (Standard); Future    Chronic hepatitis C without hepatic coma (CMS-HCC) - 03/22/22 Hep C antibody +, HCV RNA detected HCV RNA 41, 004. HCV RNA 4.61.  -  Mild transaminitis noted 03/27/22 repeat today - Otherwise asymptomatic -  Patient has completed Mayvert Aug 2024.   Diabetes mellitus screening  - Hemoglobin A1c  Thyroid disorder screen  - TSH  Need for lipid screening  - Lipid Panel  Bipolar 1 disorder (CMS-HCC) - Patient follows with Dr. Daniel and [facility] Current tx: Risperidone 1 mg po BID  Paranoid schizophrenia (CMS-HCC) - Patient follows with Dr. Daniel and [facility] Current tx: Risperidone 1 mg po BID  Tobacco use disorder Patient advised to quit all tobacco products.  Patient states she is trying to quit on her own and is down to 6 cigarettes/day.   HM: Patient declined Pneumovax.      Return in about 1 year (around 07/07/2024) for Annual physical. I personally spent 40 minutes face-to-face and non-face-to-face in the care of this patient, which  includes all pre, intra, and post visit time on the date of service. All documented time was specific to the E/M visit and does not include any procedures that may have been performed.  Subjective:   HPI: Bailey Cabrera is a 33 y.o. female here for Annual Exam (Hasn't had a pap for 8 years)   Chronic Hep C: Patient was an IVDU and was checked for HEP C 3 years ago and than went to prison [facility]) and  now is s/p treatment completed in August 2024 per patient.  Schizophrenia: Patient goes to [place] in [place], [place] Dr. Daniel. Current tx: Risperidol tab BID,Vyvance .  IVDU ( heroin): Patient takes Suboxone.  IBS- C Paitent has taken miralx daily and this did not help. Patient last BM was 3 days ago and she states it was  small.  Snoring: Patient states she is snoring and would like sleep study.   HM:  Declined pneumovax  ROS negative unless otherwise noted in HPI.  Allergies:   Penicillins and Sulfamethoxazole -trimethoprim   Current Medications:   Current Outpatient Medications  Medication Sig Dispense Refill  . PARoxetine (PAXIL) 20 MG tablet Take 1 tablet (20 mg total) by mouth every morning.    . prazosin (MINIPRESS) 1 MG capsule TAKE 1 TO 2 CAPSULES BY MOUTH EVERY NIGHT AT BEDTIME    . risperiDONE (RISPERDAL M-TABS) 1 MG disintegrating tablet Dissolve 1 tablet (1 mg total) in the mouth two (2) times a day.    . SUBOXONE  8-2 mg sublingual film 1 Film (8 mg of buprenorphine total) daily.    SABRA glecaprevir-pibrentasvir (MAVYRET) 100-40 mg tablet Take 3 tablets by mouth daily. Take with food. 168 tablet 0  . VYVANSE 30 mg cap capsule Take 1 capsule (30 mg total) by mouth.     No current facility-administered medications for this visit.    Objective:   Vitals:   07/08/23 1409  BP: 107/70  Pulse: 76  Temp: 36.7 C (98 F)  SpO2: 99%   Body mass index is 26.57 kg/m.  Physical Exam Constitutional:      Appearance: Normal appearance.  HENT:      Head: Normocephalic and atraumatic.     Right Ear: Tympanic membrane, ear canal and external ear normal.     Left Ear: Tympanic membrane, ear canal and external ear normal.     Nose: Nose normal.     Mouth/Throat:     Mouth: Mucous membranes are moist.     Pharynx: Oropharynx is clear.  Eyes:     Extraocular Movements: Extraocular movements intact.     Conjunctiva/sclera: Conjunctivae normal.     Pupils: Pupils are equal, round, and reactive to light.  Cardiovascular:     Rate and Rhythm: Normal rate and regular rhythm.     Heart sounds: Normal heart sounds.  Pulmonary:     Effort: Pulmonary effort is normal.     Breath sounds: Normal breath sounds.  Chest:     Comments: Breast: bilateral breast exam was performed in upright and supine position. There were no palpable masses nipple irregularity or skin changes today. Abdominal:     General: Abdomen is flat. Bowel sounds are normal.     Palpations: Abdomen is soft.  Musculoskeletal:        General: Normal range of motion.     Cervical back: Normal range of motion and neck supple.  Skin:    General: Skin is warm and dry.     Capillary Refill: Capillary refill takes less than 2 seconds.  Neurological:     General: No focal deficit present.     Mental Status: She is alert and oriented to person, place, and time. Mental status is at baseline.  Psychiatric:        Mood and Affect: Mood normal.        Behavior: Behavior normal.        Thought Content: Thought content normal.        Judgment: Judgment normal.      No results found for this visit on 07/08/23.  Slater MARLA Sole, M.D.   [facility] at [place] [address] [place], [place] [address]

## 2023-11-28 DIAGNOSIS — B182 Chronic viral hepatitis C: Principal | ICD-10-CM

## 2024-01-26 ENCOUNTER — Emergency Department
Admission: EM | Admit: 2024-01-26 | Discharge: 2024-01-27 | Disposition: A | Discharge status: Other Acute Inpatient Hospital | Payer: MEDICAID | Attending: Emergency Medicine | Admitting: Emergency Medicine

## 2024-01-26 ENCOUNTER — Other Ambulatory Visit: Payer: Self-pay

## 2024-01-26 DIAGNOSIS — F29 Unspecified psychosis not due to a substance or known physiological condition: Secondary | ICD-10-CM

## 2024-01-26 DIAGNOSIS — F15959 Other stimulant use, unspecified with stimulant-induced psychotic disorder, unspecified: Secondary | ICD-10-CM | POA: Diagnosis present

## 2024-01-26 DIAGNOSIS — J45909 Unspecified asthma, uncomplicated: Secondary | ICD-10-CM | POA: Insufficient documentation

## 2024-01-26 DIAGNOSIS — Z8659 Personal history of other mental and behavioral disorders: Secondary | ICD-10-CM | POA: Diagnosis not present

## 2024-01-26 LAB — COMPREHENSIVE METABOLIC PANEL WITH GFR
ALT: 7 U/L (ref 0–44)
AST: 16 U/L (ref 15–41)
Albumin: 4.7 g/dL (ref 3.5–5.0)
Alkaline Phosphatase: 74 U/L (ref 38–126)
Anion gap: 14 (ref 5–15)
BUN: 7 mg/dL (ref 6–20)
CO2: 24 mmol/L (ref 22–32)
Calcium: 9.8 mg/dL (ref 8.9–10.3)
Chloride: 102 mmol/L (ref 98–111)
Creatinine, Ser: 0.74 mg/dL (ref 0.44–1.00)
GFR, Estimated: >60 mL/min (ref >60)
Glucose, Bld: 124 mg/dL — ABNORMAL HIGH (ref 70–99)
Potassium: 3.9 mmol/L (ref 3.5–5.1)
Sodium: 140 mmol/L (ref 135–145)
Total Bilirubin: 0.3 mg/dL (ref 0.0–1.2)
Total Protein: 8.2 g/dL — ABNORMAL HIGH (ref 6.5–8.1)

## 2024-01-26 LAB — URINE DRUG SCREEN
Amphetamines: POSITIVE — AB
Barbiturates: NEGATIVE
Benzodiazepines: NEGATIVE
Cocaine: NEGATIVE
Fentanyl: NEGATIVE
Methadone Scn, Ur: NEGATIVE
Opiates: NEGATIVE
Tetrahydrocannabinol: NEGATIVE

## 2024-01-26 LAB — CBC
HCT: 42.9 % (ref 36.0–46.0)
Hemoglobin: 14.7 g/dL (ref 12.0–15.0)
MCH: 31.7 pg (ref 26.0–34.0)
MCHC: 34.3 g/dL (ref 30.0–36.0)
MCV: 92.7 fL (ref 80.0–100.0)
Platelets: 302 K/uL (ref 150–400)
RBC: 4.63 MIL/uL (ref 3.87–5.11)
RDW: 11.8 % (ref 11.5–15.5)
WBC: 9.9 K/uL (ref 4.0–10.5)
nRBC: 0 % (ref 0.0–0.2)

## 2024-01-26 LAB — ETHANOL: Alcohol, Ethyl (B): <15 mg/dL (ref <15)

## 2024-01-26 NOTE — ED Triage Notes (Addendum)
 Pt to ED via [facility] under IVC, Pt reports hx schizophrenia and has been having visual hallucinations of her children being tortured in software engineer, pt reports she saw her PCP today and they started her on lithium. Pt reports prior to getting this rx she was sporadically taking her meds.

## 2024-01-26 NOTE — ED Notes (Signed)
 Pt stated she was home with her grandfather who has dementia and sees things that aren't there. Then she starts to have visual hallucinations as well. ( See triage note.)   Pt is calm and cooperative at this time. States she just needs to start taking the medications her PCP prescribed for her today and she will be ok. Stated she doesn't feel like she needs to be here and hopes to go home after she sees the EDP and psych team.

## 2024-01-26 NOTE — ED Provider Notes (Signed)
 [facility] Provider Note    Event Date/Time   First MD Initiated Contact with Patient 01/26/24 2015     (approximate)   History   Psychiatric Evaluation   HPI  Bailey Cabrera is a 33 y.o. female who presents to the emergency department today because of concerns for psychosis.  Patient does come under IVC.  Patient states she has history of schizophrenia.  She also states that her doctor has been trying to switch up her medication.  Is unclear if she currently is taking medication.  No medical complaints.     Physical Exam   Triage Vital Signs: ED Triage Vitals  Encounter Vitals Group     BP 01/26/24 1951 (!) 115/90     Girls Systolic BP Percentile --      Girls Diastolic BP Percentile --      Boys Systolic BP Percentile --      Boys Diastolic BP Percentile --      Pulse Rate 01/26/24 1951 99     Resp 01/26/24 1951 20     Temp 01/26/24 1951 98.3 F (36.8 C)     Temp src --      SpO2 01/26/24 1951 100 %     Weight 01/26/24 1949 137 lb (62.1 kg)     Height 01/26/24 1949 5' 1 (1.549 m)     Head Circumference --      Peak Flow --      Pain Score 01/26/24 1949 0     Pain Loc --      Pain Education --      Exclude from Growth Chart --     Most recent vital signs: Vitals:   01/26/24 1951  BP: (!) 115/90  Pulse: 99  Resp: 20  Temp: 98.3 F (36.8 C)  SpO2: 100%   General: Awake, alert, not completely oriented. CV:  Good peripheral perfusion.  Resp:  Normal effort.  Abd:  No distention.   ED Results / Procedures / Treatments   Labs (all labs ordered are listed, but only abnormal results are displayed) Labs Reviewed  COMPREHENSIVE METABOLIC PANEL WITH GFR - Abnormal; Notable for the following components:      Result Value   Glucose, Bld 124 (*)    Total Protein 8.2 (*)    All other components within normal limits  CBC  ETHANOL  URINE DRUG SCREEN  POC URINE PREG, ED      EKG  None   RADIOLOGY None   PROCEDURES:  Critical Care performed: No  MEDICATIONS ORDERED IN ED: Medications - No data to display   IMPRESSION / MDM / ASSESSMENT AND PLAN / ED COURSE  I reviewed the triage vital signs and the nursing notes.                              Differential diagnosis includes, but is not limited to, psychiatric illness, drug induced mood disorder  Patient's presentation is most consistent with acute presentation with potential threat to life or bodily function.   Patient presented to the emergency department today under IVC.  No medical complaints at this time.  Will continue IVC and have psychiatry evaluate.  The patient has been placed in psychiatric observation due to the need to provide a safe environment for the patient while obtaining psychiatric consultation and evaluation, as well as ongoing medical and medication management to treat the patient's condition.  The  patient has been placed under full IVC at this time.      FINAL CLINICAL IMPRESSION(S) / ED DIAGNOSES   Final diagnoses:  Psychosis, unspecified psychosis type (HCC)     Note:  This document was prepared using Dragon voice recognition software and may include unintentional dictation errors.    Floy Roberts, MD 01/26/24 2115

## 2024-01-26 NOTE — ED Notes (Signed)
 Pt given a cup of water at her request. Pt stated she hasn't drank any and needs some to be able to provided the urine sample that is ordered.

## 2024-01-26 NOTE — ED Notes (Addendum)
 Pt belongings:  Black shirt White bra Black pants Black shoes Necklace 2 rings

## 2024-01-26 NOTE — BH Assessment (Addendum)
 Comprehensive Clinical Assessment (CCA) Note  01/26/2024 Bailey Cabrera [id number]  Chief Complaint: Patient is a 33 year old female presenting to [facility] ED under IVC. Per triage note Pt to ED via [facility] under IVC, Pt reports hx schizophrenia and has been having visual hallucinations of her children being tortured in software engineer, pt reports she saw her PCP today and they started her on lithium. Pt reports prior to getting this rx she was sporadically taking her meds. During assessment patient appears alert and oriented x4, calm and cooperative. Patient reports I'm Schizophrenic, I haven't been on my meds but my doctor started me on Lithium today, my grandpa has dementia and tonight he said he saw someone outside so I got scared and paranoid, two people that see things isn't good, I'm fine when my husband is home because my husband deals with him instead of me. Patient reports having VH for 2 years this isn't anything new and I have trauma. Patient reports that she contacted the police due to the fear she was having and I guess they didn't like that I was saying that I was seeing things about my kids but my husband told them that I was okay but the police had already started the paperwork, I really don't need to miss work tomorrow. Patient currently receives outpatient treatment at [facility] for medication management and will be also starting substance abuse treatment. Patient reports using Methamphetamine and does admit to using 4 days ago. Patient also reports attending a church program for her substance use. Patient denies any current SI/HI/AH/VH.   Contacted the patient's husband for collateral Carolann Brazell [phone] who reports I really don't know what happened, I was at work, she sees people out in the yard or out in the trees, she thinks that people are out to get her. I don't think she's a danger to herself but she also uses Meth and I left her for it, so I told her  if she stops that I would stay. She's not bad when I'm there, I think she needs to come home and get a good night sleep.   Per Psyc NP Jon McLauchlin patient is recommended for Inpatient  Chief Complaint  Patient presents with   Psychiatric Evaluation   Visit Diagnosis: Hx of Methamphetamine abuse. Hx of Schizophrenia    CCA Screening, Triage and Referral (STR)  Patient Reported Information How did you hear about us ? Legal System  Referral name: No data recorded Referral phone number: No data recorded  Whom do you see for routine medical problems? No data recorded Practice/Facility Name: No data recorded Practice/Facility Phone Number: No data recorded Name of Contact: No data recorded Contact Number: No data recorded Contact Fax Number: No data recorded Prescriber Name: No data recorded Prescriber Address (if known): No data recorded  What Is the Reason for Your Visit/Call Today? Pt to ED via [facility] under IVC, Pt reports hx schizophrenia and has been having visual hallucinations of her children being tortured in software engineer, pt reports she saw her PCP today and they started her on lithium. Pt reports prior to getting this rx she was sporadically taking her meds.  How Long Has This Been Causing You Problems? > than 6 months  What Do You Feel Would Help You the Most Today? No data recorded  Have You Recently Been in Any Inpatient Treatment (Hospital/Detox/Crisis Center/28-Day Program)? No data recorded Name/Location of Program/Hospital:No data recorded How Long Were You There? No data recorded When  Were You Discharged? No data recorded  Have You Ever Received Services From [facility] Before? No data recorded Who Do You See at [facility]? No data recorded  Have You Recently Had Any Thoughts About Hurting Yourself? No  Are You Planning to Commit Suicide/Harm Yourself At This time? No   Have you Recently Had Thoughts About Hurting Someone [name]? No  Explanation:  No data recorded  Have You Used Any Alcohol or Drugs in the Past 24 Hours? No  How Long Ago Did You Use Drugs or Alcohol? No data recorded What Did You Use and How Much? No data recorded  Do You Currently Have a Therapist/Psychiatrist? Yes  Name of Therapist/Psychiatrist: [facility]   Have You Been Recently Discharged From Any Office Practice or Programs? No  Explanation of Discharge From Practice/Program: No data recorded    CCA Screening Triage Referral Assessment Type of Contact: Face-to-Face  Is this Initial or Reassessment? No data recorded Date Telepsych consult ordered in [facility]:  No data recorded Time Telepsych consult ordered in [facility]:  No data recorded  Patient Reported Information Reviewed? No data recorded Patient Left Without Being Seen? No data recorded Reason for Not Completing Assessment: No data recorded  Collateral Involvement: No data recorded  Does Patient Have a Court Appointed Legal Guardian? No data recorded Name and Contact of Legal Guardian: No data recorded If Minor and Not Living with Parent(s), Who has Custody? No data recorded Is CPS involved or ever been involved? Never  Is APS involved or ever been involved? Never   Patient Determined To Be At Risk for Harm To Self or Others Based on Review of Patient Reported Information or Presenting Complaint? No  Method: No data recorded Availability of Means: No data recorded Intent: No data recorded Notification Required: No data recorded Additional Information for Danger to Others Potential: No data recorded Additional Comments for Danger to Others Potential: No data recorded Are There Guns or Other Weapons in Your Home? No  Types of Guns/Weapons: No data recorded Are These Weapons Safely Secured?                            No data recorded Who Could Verify You Are Able To Have These Secured: No data recorded Do You Have any Outstanding Charges, Pending Court Dates, Parole/Probation? No  data recorded Contacted To Inform of Risk of Harm To Self or Others: No data recorded  Location of Assessment: [facility] ED   Does Patient Present under Involuntary Commitment? Yes  IVC Papers Initial File Date: No data recorded  [place] of Residence: [place]   Patient Currently Receiving the Following Services: Medication Management; CD--IOP (Intensive Chemical Dependency Program)   Determination of Need: Emergent (2 hours)   Options For Referral: No data recorded    CCA Biopsychosocial Intake/Chief Complaint:  No data recorded Current Symptoms/Problems: No data recorded  Patient Reported Schizophrenia/Schizoaffective Diagnosis in Past: Yes   Strengths: Patient is able to communicate her needs; has the support of her husband  Preferences: No data recorded Abilities: No data recorded  Type of Services Patient Feels are Needed: No data recorded  Initial Clinical Notes/Concerns: No data recorded  Mental Health Symptoms Depression:  None   Duration of Depressive symptoms: No data recorded  Mania:  None   Anxiety:   Difficulty concentrating; Fatigue; Restlessness; Worrying   Psychosis:  Hallucinations   Duration of Psychotic symptoms: Greater than six months   Trauma:  Avoids reminders of event; Emotional numbing   Obsessions:  Recurrent & persistent thoughts/impulses/images   Compulsions:  None   Inattention:  None   Hyperactivity/Impulsivity:  None   Oppositional/Defiant Behaviors:  None   Emotional Irregularity:  None   Other Mood/Personality Symptoms:  No data recorded   Mental Status Exam Appearance and self-care  Stature:  Average   Weight:  Average weight   Clothing:  Casual   Grooming:  Normal   Cosmetic use:  None   Posture/gait:  Normal   Motor activity:  Not Remarkable   Sensorium  Attention:  Normal   Concentration:  Normal   Orientation:  X5   Recall/memory:  Normal   Affect and Mood  Affect:  Anxious   Mood:   Anxious   Relating  Eye contact:  Normal   Facial expression:  Responsive   Attitude toward examiner:  Cooperative   Thought and Language  Speech flow: Clear and Coherent   Thought content:  Appropriate to Mood and Circumstances   Preoccupation:  None   Hallucinations:  None (No hallucinations currently was experiencing AH/VH earlier today)   Organization:  No data recorded  [employer] of Knowledge:  Good   Intelligence:  Average   Abstraction:  Normal   Judgement:  Fair   Reality Testing:  Adequate   Insight:  Good   Decision Making:  Normal   Social Functioning  Social Maturity:  Responsible   Social Judgement:  Normal   Stress  Stressors:  Other (Comment)   Coping Ability:  Overwhelmed   Skill Deficits:  None   Supports:  Family; Friends/Service system     Religion: Religion/Spirituality Are You A Religious Person?: No  Leisure/Recreation: Leisure / Recreation Do You Have Hobbies?: No  Exercise/Diet: Exercise/Diet Do You Exercise?: No Have You Gained or Lost A Significant Amount of Weight in the Past Six Months?: No Do You Follow a Special Diet?: No Do You Have Any Trouble Sleeping?: No   CCA Employment/Education Employment/Work Situation: Employment / Work Situation Employment Situation: Employed Work Stressors: none reported Patient's Job has Been Impacted by Current Illness: No Has Patient ever Been in Equities Trader?: No  Education: Education Is Patient Currently Attending School?: No Did You Have An Individualized Education Program (IIEP): No Did You Have Any Difficulty At [employer]?: No Patient's Education Has Been Impacted by Current Illness: No   CCA Family/Childhood History Family and Relationship History: Family history Marital status: Married Number of Years Married:  (unknown) What types of issues is patient dealing with in the relationship?: no reported issues Additional relationship information:  unknown Does patient have children?:  (unknown)  Childhood History:  Childhood History Did patient suffer any verbal/emotional/physical/sexual abuse as a child?:  (reports having trauma but did not specify) Did patient suffer from severe childhood neglect?:  (unknown) Has patient ever been sexually abused/assaulted/raped as an adolescent or adult?:  (unknown) Was the patient ever a victim of a crime or a disaster?:  (unknown) Witnessed domestic violence?:  (unknown) Has patient been affected by domestic violence as an adult?:  (unknown)  Child/Adolescent Assessment:     CCA Substance Use Alcohol/Drug Use: Alcohol / Drug Use Pain Medications: See MAR Prescriptions: See MAR Over the Counter: See MAR History of alcohol / drug use?: Yes Longest period of sobriety (when/how long): UTA Negative Consequences of Use: Personal relationships, Legal Withdrawal Symptoms:  (UTA) Substance #1 Name of Substance 1: Methamphetamine 1 - Last Use / Amount: 4 days  ago                       ASAM's:  Six Dimensions of Multidimensional Assessment  Dimension 1:  Acute Intoxication and/or Withdrawal Potential:   Dimension 1:  Description of individual's past and current experiences of substance use and withdrawal: Pt has a hx of polysubstance abuse  Dimension 2:  Biomedical Conditions and Complications:   Dimension 2:  Description of patient's biomedical conditions and  complications: n/a  Dimension 3:  Emotional, Behavioral, or Cognitive Conditions and Complications:  Dimension 3:  Description of emotional, behavioral, or cognitive conditions and complications: Pt has a hx of schizoprhrenia and bipolar disorder  Dimension 4:  Readiness to Change:  Dimension 4:  Description of Readiness to Change criteria: Pt expressed no desire to change due to being acutely psychotic  Dimension 5:  Relapse, Continued use, or Continued Problem Potential:     Dimension 6:  Recovery/Living Environment:      ASAM Severity Score: ASAM's Severity Rating Score: 10  ASAM Recommended Level of Treatment: ASAM Recommended Level of Treatment: Level I Outpatient Treatment   Substance use Disorder (SUD) Substance Use Disorder (SUD)  Checklist Symptoms of Substance Use: Evidence of tolerance, Continued use despite persistent or recurrent social, interpersonal problems, caused or exacerbated by use, Continued use despite having a persistent/recurrent physical/psychological problem caused/exacerbated by use, Recurrent use that results in a failure to fulfill major role obligations (work, school, home)  Recommendations for Services/Supports/Treatments: Recommendations for Services/Supports/Treatments Recommendations For Services/Supports/Treatments: SAIOP (Substance Abuse Intensive Outpatient Program), Peer Support, Individual Therapy, Detox  DSM5 Diagnoses: Patient Active Problem List   Diagnosis Date Noted   Polysubstance abuse (HCC) 12/07/2021   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   Lower abdominal pain 02/03/2016   Marijuana use 07/20/2014   Herpes genitalis 07/20/2014   Anxiety 07/20/2014   Depression 07/20/2014   Bipolar 1 disorder (HCC) 07/20/2014    Patient Centered Plan: Patient is on the following Treatment Plan(s):  Anxiety and Substance Abuse   Referrals to Alternative Service(s): Referred to Alternative Service(s):   Place:   Date:   Time:    Referred to Alternative Service(s):   Place:   Date:   Time:    Referred to Alternative Service(s):   Place:   Date:   Time:    Referred to Alternative Service(s):   Place:   Date:   Time:      @BHCOLLABOFCARE @  [employer], LCAS-A

## 2024-01-27 ENCOUNTER — Inpatient Hospital Stay
Admission: AD | Admit: 2024-01-27 | Discharge: 2024-02-04 | DRG: 897 | Disposition: A | Discharge status: Home / Self Care | Payer: MEDICAID | Source: Intra-hospital | Attending: Psychiatry | Admitting: Psychiatry

## 2024-01-27 DIAGNOSIS — Z818 Family history of other mental and behavioral disorders: Secondary | ICD-10-CM

## 2024-01-27 DIAGNOSIS — F19959 Other psychoactive substance use, unspecified with psychoactive substance-induced psychotic disorder, unspecified: Principal | ICD-10-CM | POA: Diagnosis present

## 2024-01-27 DIAGNOSIS — Z79899 Other long term (current) drug therapy: Secondary | ICD-10-CM | POA: Diagnosis not present

## 2024-01-27 DIAGNOSIS — F32A Depression, unspecified: Secondary | ICD-10-CM | POA: Diagnosis present

## 2024-01-27 DIAGNOSIS — I959 Hypotension, unspecified: Secondary | ICD-10-CM | POA: Diagnosis not present

## 2024-01-27 DIAGNOSIS — F419 Anxiety disorder, unspecified: Secondary | ICD-10-CM | POA: Diagnosis present

## 2024-01-27 DIAGNOSIS — F17210 Nicotine dependence, cigarettes, uncomplicated: Secondary | ICD-10-CM | POA: Diagnosis present

## 2024-01-27 DIAGNOSIS — Z560 Unemployment, unspecified: Secondary | ICD-10-CM | POA: Diagnosis not present

## 2024-01-27 DIAGNOSIS — Z880 Allergy status to penicillin: Secondary | ICD-10-CM

## 2024-01-27 DIAGNOSIS — Z881 Allergy status to other antibiotic agents status: Secondary | ICD-10-CM

## 2024-01-27 DIAGNOSIS — F15959 Other stimulant use, unspecified with stimulant-induced psychotic disorder, unspecified: Principal | ICD-10-CM | POA: Diagnosis present

## 2024-01-27 DIAGNOSIS — F319 Bipolar disorder, unspecified: Secondary | ICD-10-CM | POA: Diagnosis present

## 2024-01-27 DIAGNOSIS — F209 Schizophrenia, unspecified: Secondary | ICD-10-CM | POA: Diagnosis present

## 2024-01-27 DIAGNOSIS — Z882 Allergy status to sulfonamides status: Secondary | ICD-10-CM

## 2024-01-27 DIAGNOSIS — R0789 Other chest pain: Secondary | ICD-10-CM | POA: Diagnosis not present

## 2024-01-27 DIAGNOSIS — R079 Chest pain, unspecified: Secondary | ICD-10-CM

## 2024-01-27 DIAGNOSIS — Z91148 Patient's other noncompliance with medication regimen for other reason: Secondary | ICD-10-CM | POA: Diagnosis not present

## 2024-01-27 DIAGNOSIS — F17290 Nicotine dependence, other tobacco product, uncomplicated: Secondary | ICD-10-CM | POA: Diagnosis present

## 2024-01-27 LAB — PREGNANCY, URINE: Preg Test, Ur: NEGATIVE

## 2024-01-27 LAB — LITHIUM LEVEL: Lithium Lvl: <0.1 mmol/L — ABNORMAL LOW (ref 0.60–1.20)

## 2024-01-27 MED ORDER — OLANZAPINE 5 MG PO TBDP
10.0000 mg | ORAL_TABLET | Freq: Every day | ORAL | Status: DC
  Administered 2024-01-27: 10 mg via ORAL
  Filled 2024-01-27: qty 2

## 2024-01-27 MED ORDER — SODIUM CHLORIDE 0.9 % IV BOLUS
1000.0000 mL | Freq: Once | INTRAVENOUS | Status: AC
  Administered 2024-01-27: 1000 mL via INTRAVENOUS

## 2024-01-27 NOTE — ED Notes (Signed)
 Pt given dinner tray.

## 2024-01-27 NOTE — ED Notes (Signed)
 BMU Nurse just called this ED RN and was refusing to allow pt on the floor at the [facility] and was holding her in the sally port until report was called. 1024PM

## 2024-01-27 NOTE — ED Notes (Signed)
 Report called to Lang RN in [facility], who requested that transfer be delayed for approximately 45 minutes due to another admission.

## 2024-01-27 NOTE — ED Notes (Signed)
 Pt ABCs intact. RR even and unlabored. Pt in NAD. Bed in lowest locked position. Call bell in reach. Denies needs at this time.    Past Medical History:  Diagnosis Date   Anemia    Anxiety and depression    Asthma    Bipolar 1 disorder (HCC)    Drug use    Herpes genitalis 07/20/2014   Has been tx'd since 32weeks for suppresion   Hypotension    Marijuana use    Schizophrenia (HCC) 2022   diagnosed while in prision in 2022   Seizures (HCC)    During pregnancy; most recent April 2016

## 2024-01-27 NOTE — ED Notes (Signed)
 Lab called and will add on Lithium level.

## 2024-01-27 NOTE — ED Notes (Signed)
 This RN attempted to call report, per Orlean RN we have a bit of a situation. Someone didn't come into work and I am passing meds we will have to take report later. Per dayshift report had already been called but pt BP needed to improve before sending down. [facility] made aware.

## 2024-01-27 NOTE — ED Notes (Signed)
 Pt provided with breakfast tray. Pt is asleep. Tray left at bedside.

## 2024-01-27 NOTE — ED Notes (Signed)
 IVC/Recommend inpatient psychiatric hospitalization

## 2024-01-27 NOTE — ED Provider Notes (Signed)
 Seve notification from psychiatry team, L. Strader RN, patient being admitted to behavioral medicine unit at [facility], MD 01/27/24 [phone]

## 2024-01-27 NOTE — ED Notes (Signed)
 Ivc pt to 312 BMU

## 2024-01-27 NOTE — ED Notes (Signed)
 Lunch tray provided to pt.

## 2024-01-27 NOTE — ED Notes (Signed)
 IVC patient moved to 19H due to low BP, pending bed in [facility] BMU

## 2024-01-27 NOTE — ED Notes (Signed)
 Patient was given phone per request. Patient was crying and yelling at her husband while on the phone after 10 minutes. Patient was asked to end phone call and give back the phone and patient did comply with that request. Patient is crying, tearful, saying she has to be at work by [employer] or will lose her job. Patient requested to speak with a doctor to get her out of here.

## 2024-01-27 NOTE — ED Provider Notes (Signed)
-----------------------------------------   5:35 PM on 01/27/2024 ----------------------------------------- Patient's blood pressure was borderline low 87/59.  We brought her back to the main part of the emergency department she is currently 90/71.  Patient is awake alert mentating well.  States her blood pressure always runs low.  We will dose 1 L of IV fluids and reassess.  I reviewed several old notes with the patient her blood pressure does tend to run in the low 100s chronically.  Repeat blood pressure now 101/58 which is similar to her historical values.  Patient is awake alert she has no complaints she is eating food.  Medically cleared ready for psychiatric admission.   Dorothyann Drivers, MD 01/27/24 1900

## 2024-01-27 NOTE — ED Notes (Signed)
 Patient is irritated, states she needs to go to work today. Patient states she believed that she was being discharged. Patient lives with husband and grandfather. Patient is eating breakfast in her room and requested a phone.

## 2024-01-27 NOTE — ED Notes (Signed)
 This RN attempted to call report again and they advised they are still giving meds and wasn't ready for report.

## 2024-01-27 NOTE — ED Notes (Addendum)
 Two bags of belongings were placed back in storage room. Dinner was sent to patient in the ED. Report given to Delon Side RN. Patient was cooperative with the move.

## 2024-01-27 NOTE — ED Notes (Addendum)
 Patient is resting on stretcher. No further crying noted. Patient appears to be asleep. RR are equal.
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
## 2024-01-27 NOTE — ED Notes (Signed)
 Lt. Buytis from Animal control called and stated that officers were concerned about the patient's dogs and that the patient's grandfather wouldn't be able to take care of them. This clinical research associate contacted patient's emergency contact, Bailey Cabrera, patient's husband, who gave permission to give his contact to Animal Control. Animal Control was contacted and stated that they had already spoke with other residents at the house who said they were able to take care of the dog.

## 2024-01-27 NOTE — ED Notes (Signed)
 Attempted to call charge nurse to report vital signs and request a move back to the main ED. Will attempt to call MD in that area.

## 2024-01-28 ENCOUNTER — Other Ambulatory Visit: Payer: Self-pay

## 2024-01-28 ENCOUNTER — Encounter: Payer: Self-pay | Admitting: Psychiatry

## 2024-01-28 DIAGNOSIS — F19959 Other psychoactive substance use, unspecified with psychoactive substance-induced psychotic disorder, unspecified: Secondary | ICD-10-CM | POA: Diagnosis not present

## 2024-01-28 LAB — HEMOGLOBIN A1C
Hgb A1c MFr Bld: 4.8 % (ref 4.8–5.6)
Mean Plasma Glucose: 91.06 mg/dL

## 2024-01-28 LAB — TSH: TSH: 1.58 u[IU]/mL (ref 0.350–4.500)

## 2024-01-28 LAB — LIPID PANEL
Cholesterol: 131 mg/dL (ref 0–200)
HDL: 36 mg/dL — ABNORMAL LOW (ref >40)
LDL Cholesterol: 84 mg/dL (ref 0–99)
Total CHOL/HDL Ratio: 3.6 ratio
Triglycerides: 52 mg/dL (ref <150)
VLDL: 10 mg/dL (ref 0–40)

## 2024-01-28 MED ORDER — LORAZEPAM 2 MG/ML IJ SOLN
2.0000 mg | Freq: Three times a day (TID) | INTRAMUSCULAR | Status: DC | PRN
  Administered 2024-01-30: 2 mg via INTRAMUSCULAR
  Filled 2024-01-28: qty 1

## 2024-01-28 MED ORDER — OLANZAPINE 10 MG PO TBDP
10.0000 mg | ORAL_TABLET | Freq: Every day | ORAL | Status: DC
  Administered 2024-01-28: 10 mg via ORAL
  Filled 2024-01-28: qty 1

## 2024-01-28 MED ORDER — ACETAMINOPHEN 325 MG PO TABS
650.0000 mg | ORAL_TABLET | Freq: Four times a day (QID) | ORAL | Status: DC | PRN
  Administered 2024-01-29: 650 mg via ORAL
  Filled 2024-01-28: qty 2

## 2024-01-28 MED ORDER — DIPHENHYDRAMINE HCL 50 MG/ML IJ SOLN: 50.0000 mg | Freq: Three times a day (TID) | INTRAMUSCULAR | Status: DC | PRN

## 2024-01-28 MED ORDER — BUPRENORPHINE HCL-NALOXONE HCL 8-2 MG SL SUBL
1.0000 | SUBLINGUAL_TABLET | Freq: Three times a day (TID) | SUBLINGUAL | Status: DC
  Administered 2024-01-28 – 2024-02-01 (×8): 1 via SUBLINGUAL
  Filled 2024-01-28 (×11): qty 1

## 2024-01-28 MED ORDER — HALOPERIDOL 5 MG PO TABS
5.0000 mg | ORAL_TABLET | Freq: Three times a day (TID) | ORAL | Status: DC | PRN
  Administered 2024-02-01: 5 mg via ORAL
  Filled 2024-01-28: qty 1

## 2024-01-28 MED ORDER — NICOTINE POLACRILEX 2 MG MT GUM
2.0000 mg | CHEWING_GUM | OROMUCOSAL | Status: DC | PRN
  Administered 2024-01-28: 2 mg via ORAL

## 2024-01-28 MED ORDER — DIPHENHYDRAMINE HCL 50 MG/ML IJ SOLN
50.0000 mg | Freq: Three times a day (TID) | INTRAMUSCULAR | Status: DC | PRN
  Administered 2024-01-30: 50 mg via INTRAMUSCULAR
  Filled 2024-01-28: qty 1

## 2024-01-28 MED ORDER — TRAZODONE HCL 50 MG PO TABS
50.0000 mg | ORAL_TABLET | Freq: Every evening | ORAL | Status: DC | PRN
  Filled 2024-01-28: qty 1

## 2024-01-28 MED ORDER — HALOPERIDOL LACTATE 5 MG/ML IJ SOLN
10.0000 mg | Freq: Three times a day (TID) | INTRAMUSCULAR | Status: DC | PRN
  Administered 2024-01-30: 10 mg via INTRAMUSCULAR

## 2024-01-28 MED ORDER — DIPHENHYDRAMINE HCL 25 MG PO CAPS
50.0000 mg | ORAL_CAPSULE | Freq: Three times a day (TID) | ORAL | Status: DC | PRN
  Administered 2024-02-01: 50 mg via ORAL
  Filled 2024-01-28: qty 2

## 2024-01-28 MED ORDER — LORAZEPAM 2 MG/ML IJ SOLN: 2.0000 mg | Freq: Three times a day (TID) | INTRAMUSCULAR | Status: DC | PRN

## 2024-01-28 MED ORDER — HALOPERIDOL LACTATE 5 MG/ML IJ SOLN
5.0000 mg | Freq: Three times a day (TID) | INTRAMUSCULAR | Status: DC | PRN
  Filled 2024-01-28 (×2): qty 1

## 2024-01-28 MED ORDER — ALUM & MAG HYDROXIDE-SIMETH 200-200-20 MG/5ML PO SUSP: 30.0000 mL | ORAL | Status: DC | PRN

## 2024-01-28 MED ORDER — NICOTINE 21 MG/24HR TD PT24
21.0000 mg | MEDICATED_PATCH | Freq: Every day | TRANSDERMAL | Status: DC
  Administered 2024-01-28 – 2024-02-04 (×8): 21 mg via TRANSDERMAL
  Filled 2024-01-28 (×7): qty 1

## 2024-01-28 MED ORDER — HYDROXYZINE HCL 25 MG PO TABS
25.0000 mg | ORAL_TABLET | Freq: Three times a day (TID) | ORAL | Status: DC | PRN
  Administered 2024-01-28: 25 mg via ORAL
  Filled 2024-01-28: qty 1

## 2024-01-28 MED ORDER — MAGNESIUM HYDROXIDE 400 MG/5ML PO SUSP
30.0000 mL | Freq: Every day | ORAL | Status: DC | PRN
  Filled 2024-01-28: qty 30
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
## 2024-01-28 NOTE — Group Note (Signed)
 Date:  01/28/2024 Time:  8:41 PM  Group Topic/Focus:  Making Healthy Choices:   The focus of this group is to help patients identify negative/unhealthy choices they were using prior to admission and identify positive/healthier coping strategies to replace them upon discharge. Managing Feelings:   The focus of this group is to identify what feelings patients have difficulty handling and develop a plan to handle them in a healthier way upon discharge. Self Care:   The focus of this group is to help patients understand the importance of self-care in order to improve or restore emotional, physical, spiritual, interpersonal, and financial health.    Participation Level:  Active  Participation Quality:  Appropriate and Attentive  Affect:  Appropriate  Cognitive:  Alert, Appropriate, and Oriented  Insight: Appropriate and Good  Engagement in Group:  Engaged  Modes of Intervention:  Discussion and Support  Additional Comments:  N/A  Bailey Cabrera 01/28/2024, 8:41 PM

## 2024-01-28 NOTE — Group Note (Signed)
 Date:  01/28/2024 Time:  5:23 PM  Group Topic/Focus:  Activity Group: The focus of the group is to encourage patients to go outside to the courtyard and get some fresh air and some exercise.    Participation Level:  Active  Participation Quality:  Appropriate  Affect:  Appropriate  Cognitive:  Appropriate  Insight: Appropriate  Engagement in Group:  Engaged  Modes of Intervention:  Activity  Additional Comments:    Camellia HERO Jazzmon Prindle 01/28/2024, 5:23 PM

## 2024-01-28 NOTE — Group Note (Signed)
 Date:  01/28/2024 Time:  10:39 AM  Group Topic/Focus:  Healthy Communication:   The focus of this group is to discuss communication, barriers to communication, as well as healthy ways to communicate with others.    Participation Level:  Did Not Attend   Bailey Cabrera 01/28/2024, 10:39 AM

## 2024-01-28 NOTE — Group Note (Signed)
 Recreation Therapy Group Note   Group Topic:Leisure Education  Group Date: 01/28/2024 Start Time: 1300 End Time: 1400 Facilitators: Celestia Jeoffrey BRAVO, LRT, CTRS Location: Craft Room  Group Description: Leisure. Patients were given the option to choose from journaling, coloring, drawing, making origami, playing with playdoh, listening to music or singing karaoke. LRT and pts discussed the meaning of leisure, the importance of participating in leisure during their free time/when they're outside of the hospital, as well as how our leisure interests can also serve as coping skills.   Goal Area(s) Addressed:  Patient will identify a current leisure interest.  Patient will learn the definition of "leisure". Patient will practice making a positive decision. Patient will have the opportunity to try a new leisure activity. Patient will communicate with peers and LRT.    Affect/Mood: N/A   Participation Level: Did not attend    Clinical Observations/Individualized Feedback: Patient did not attend group.   Plan: Continue to engage patient in RT group sessions 2-3x/week.   Jeoffrey BRAVO Celestia, LRT, CTRS 01/28/2024 4:19 PM

## 2024-01-28 NOTE — BH IP Treatment Plan (Signed)
 Interdisciplinary Treatment and Diagnostic Plan Update  01/28/2024 Time of Session: 10:35 AM Bailey Cabrera MRN: [id number]  Principal Diagnosis: Substance-induced psychotic disorder [facility])  Secondary Diagnoses: Principal Problem:   Substance-induced psychotic disorder (HCC)   Current Medications:  Current Facility-Administered Medications  Medication Dose Route Frequency Provider Last Rate Last Admin   acetaminophen  (TYLENOL ) tablet 650 mg  650 mg Oral Q6H PRN Smith, Annie B, NP       alum & mag hydroxide-simeth (MAALOX/MYLANTA) 200-200-20 MG/5ML suspension 30 mL  30 mL Oral Q4H PRN Smith, Annie B, NP       buprenorphine-naloxone  (SUBOXONE) 8-2 mg per SL tablet 1 tablet  1 tablet Sublingual TID Smith, Annie B, NP   1 tablet at 01/28/24 1232   haloperidol (HALDOL) tablet 5 mg  5 mg Oral TID PRN Smith, Annie B, NP       And   diphenhydrAMINE  (BENADRYL ) capsule 50 mg  50 mg Oral TID PRN Smith, Annie B, NP       haloperidol lactate (HALDOL) injection 5 mg  5 mg Intramuscular TID PRN Smith, Annie B, NP       And   diphenhydrAMINE  (BENADRYL ) injection 50 mg  50 mg Intramuscular TID PRN Smith, Annie B, NP       And   LORazepam (ATIVAN) injection 2 mg  2 mg Intramuscular TID PRN Smith, Annie B, NP       haloperidol lactate (HALDOL) injection 10 mg  10 mg Intramuscular TID PRN Smith, Annie B, NP       And   diphenhydrAMINE  (BENADRYL ) injection 50 mg  50 mg Intramuscular TID PRN Smith, Annie B, NP       And   LORazepam (ATIVAN) injection 2 mg  2 mg Intramuscular TID PRN Smith, Annie B, NP       hydrOXYzine (ATARAX) tablet 25 mg  25 mg Oral TID PRN Smith, Annie B, NP       magnesium hydroxide (MILK OF MAGNESIA) suspension 30 mL  30 mL Oral Daily PRN Smith, Annie B, NP       nicotine (NICODERM CQ - dosed in mg/24 hours) patch 21 mg  21 mg Transdermal Daily Jadapalle, Sree, MD   21 mg at 01/28/24 1241   nicotine polacrilex (NICORETTE) gum 2 mg  2 mg Oral PRN Jadapalle, Sree, MD   2 mg at  01/28/24 1241   OLANZapine zydis (ZYPREXA) disintegrating tablet 10 mg  10 mg Oral QHS Smith, Annie B, NP       traZODone (DESYREL) tablet 50 mg  50 mg Oral QHS PRN Smith, Annie B, NP       PTA Medications: Medications Prior to Admission  Medication Sig Dispense Refill Last Dose/Taking   desvenlafaxine (PRISTIQ) 25 MG 24 hr tablet Take 25 mg by mouth daily.      naloxone  (NARCAN ) nasal spray 4 mg/0.1 mL Spray intranasally for overdose 1 each 0    PARoxetine (PAXIL) 20 MG tablet Take 20 mg by mouth every evening.      prazosin (MINIPRESS) 2 MG capsule Take 2 mg by mouth at bedtime.      risperiDONE (RISPERDAL) 3 MG tablet Take 3 mg by mouth at bedtime.      SUBOXONE 8-2 MG FILM Place 1 Film under the tongue 3 (three) times daily.       Patient Stressors: Medication change or noncompliance   Substance abuse    Patient Strengths: Motivation for treatment/growth  Supportive family/friends   Treatment  Modalities: Medication Management, Group therapy, Case management,  1 to 1 session with clinician, Psychoeducation, Recreational therapy.   Physician Treatment Plan for Primary Diagnosis: Substance-induced psychotic disorder (HCC) Long Term Goal(s):     Short Term Goals:    Medication Management: Evaluate patient's response, side effects, and tolerance of medication regimen.  Therapeutic Interventions: 1 to 1 sessions, Unit Group sessions and Medication administration.  Evaluation of Outcomes: Not Met  Physician Treatment Plan for Secondary Diagnosis: Principal Problem:   Substance-induced psychotic disorder (HCC)  Long Term Goal(s):     Short Term Goals:       Medication Management: Evaluate patient's response, side effects, and tolerance of medication regimen.  Therapeutic Interventions: 1 to 1 sessions, Unit Group sessions and Medication administration.  Evaluation of Outcomes: Not Met   RN Treatment Plan for Primary Diagnosis: Substance-induced psychotic disorder  (HCC) Long Term Goal(s): Knowledge of disease and therapeutic regimen to maintain health will improve  Short Term Goals: Ability to verbalize frustration and anger appropriately will improve, Ability to demonstrate self-control, Ability to participate in decision making will improve, Ability to verbalize feelings will improve, Ability to disclose and discuss suicidal ideas, and Ability to identify and develop effective coping behaviors will improve  Medication Management: RN will administer medications as ordered by provider, will assess and evaluate patient's response and provide education to patient for prescribed medication. RN will report any adverse and/or side effects to prescribing provider.  Therapeutic Interventions: 1 on 1 counseling sessions, Psychoeducation, Medication administration, Evaluate responses to treatment, Monitor vital signs and CBGs as ordered, Perform/monitor CIWA, COWS, AIMS and Fall Risk screenings as ordered, Perform wound care treatments as ordered.  Evaluation of Outcomes: Not Met   LCSW Treatment Plan for Primary Diagnosis: Substance-induced psychotic disorder [facility]) Long Term Goal(s): Safe transition to appropriate next level of care at discharge, Engage patient in therapeutic group addressing interpersonal concerns.  Short Term Goals: Engage patient in aftercare planning with referrals and resources, Increase social support, Increase ability to appropriately verbalize feelings, Increase emotional regulation, Facilitate acceptance of mental health diagnosis and concerns, Facilitate patient progression through stages of change regarding substance use diagnoses and concerns, Identify triggers associated with mental health/substance abuse issues, and Increase skills for wellness and recovery  Therapeutic Interventions: Assess for all discharge needs, 1 to 1 time with Social worker, Explore available resources and support systems, Assess for adequacy in community support  network, Educate family and significant other(s) on suicide prevention, Complete Psychosocial Assessment, Interpersonal group therapy.  Evaluation of Outcomes: Not Met   Progress in Treatment: Attending groups: No. Participating in groups: No. Taking medication as prescribed: Yes. Toleration medication: Yes. Family/Significant other contact made: No, will contact:  CSW to contact once permission is granted.  Patient understands diagnosis: Yes. Discussing patient identified problems/goals with staff: Yes. Medical problems stabilized or resolved: No. Denies suicidal/homicidal ideation: Yes. Issues/concerns per patient self-inventory: No. Other: None  New problem(s) identified: No, Describe:  None  New Short Term/Long Term Goal(s):detox, elimination of symptoms of psychosis, medication management for mood stabilization; elimination of SI thoughts; development of comprehensive mental wellness/sobriety plan.    Patient Goals:  I want to go home.  Discharge Plan or Barriers: CSW to assist with the development of appropriate discharge plan.    Reason for Continuation of Hospitalization: Aggression Anxiety Delusions  Depression Mania Suicidal ideation Withdrawal symptoms  Estimated Length of Stay: 1-7 days.   Last 3 [place] Suicide Severity Risk Score: Flowsheet Row Admission (Current) from 01/27/2024 in  [facility] ED from 01/26/2024 in [facility] Emergency Department at [facility] ED from 12/07/2021 in [facility] Emergency Department at [facility]  C-SSRS RISK CATEGORY No Risk No Risk No Risk    Last PHQ 2/9 Scores:     No data to display          Scribe for Treatment Team: Fatema Rabe M Nyisha Clippard, KEN 01/28/2024 2:17 PM

## 2024-01-28 NOTE — Plan of Care (Signed)
   Problem: Education: Goal: Knowledge of [facility] Education information/materials will improve Outcome: Progressing Goal: Emotional status will improve Outcome: Progressing Goal: Mental status will improve Outcome: Progressing Goal: Verbalization of understanding the information provided will improve Outcome: Progressing
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
## 2024-01-28 NOTE — Progress Notes (Signed)
   01/28/24 1000  Psych Admission Type (Psych Patients Only)  Admission Status Involuntary  Psychosocial Assessment  Patient Complaints Anxiety  Eye Contact Brief  Facial Expression Angry  Affect Irritable  Speech Logical/coherent  Interaction Assertive  Motor Activity Slow  Appearance/Hygiene In scrubs  Behavior Characteristics Irritable  Mood Irritable  Thought Process  Coherency WDL  Content Blaming others  Delusions None reported or observed  Perception WDL  Hallucination None reported or observed  Judgment WDL  Confusion WDL  Danger to Self  Current suicidal ideation? Denies  Danger to Others  Danger to Others None reported or observed

## 2024-01-28 NOTE — BHH Suicide Risk Assessment (Signed)
 [facility] Admission Suicide Risk Assessment   Nursing information obtained from:  Patient Demographic factors:  Caucasian, Low socioeconomic status Current Mental Status:  NA Loss Factors:  Legal issues, Financial problems / change in socioeconomic status Historical Factors:  Impulsivity Risk Reduction Factors:  Employed, Living with another person, especially a relative, Positive social support  Total Time spent with patient: 30 minutes Principal Problem: Substance-induced psychotic disorder (HCC) Diagnosis:  Principal Problem:   Substance-induced psychotic disorder (HCC)  Subjective Data: This is a 33 year old female with history of schizophrenia and substance-induced psychotic disorder who presented with visual hallucinations and delusions.  She endorses recent methamphetamine and cocaine use.  She reports poor compliance with home medications.  Patient is admitted to the adult inpatient unit with every 15-minute safety monitoring.  Multidisciplinary team approach is offered.  Medication management, group/milieu therapy is offered.  Continued Clinical Symptoms:  Alcohol Use Disorder Identification Test Final Score (AUDIT): 3 The Alcohol Use Disorders Identification Test, Guidelines for Use in Primary Care, Second Edition.  World Science Writer [facility]). Score between 0-7:  no or low risk or alcohol related problems. Score between 8-15:  moderate risk of alcohol related problems. Score between 16-19:  high risk of alcohol related problems. Score 20 or above:  warrants further diagnostic evaluation for alcohol dependence and treatment.   CLINICAL FACTORS:   Schizophrenia:   Depressive state   Musculoskeletal: Strength & Muscle Tone: within normal limits Gait & Station: normal Patient leans: N/A  Psychiatric Specialty Exam:  Presentation  General Appearance:  Disheveled  Eye Contact: Poor  Speech: Clear and Coherent  Speech Volume: Normal  Handedness:No data  recorded  Mood and Affect  Mood: Anxious; Irritable  Affect: Labile   Thought Process  Thought Processes: Linear  Descriptions of Associations:Intact  Orientation:Full (Time, Place and Person)  Thought Content:Perseveration  History of Schizophrenia/Schizoaffective disorder:Yes  Duration of Psychotic Symptoms:Greater than six months  Hallucinations:No data recorded Ideas of Reference:None  Suicidal Thoughts:Suicidal Thoughts: No  Homicidal Thoughts:Homicidal Thoughts: No   Sensorium  Memory: Immediate Poor; Recent Poor  Judgment: Poor  Insight: Fair   Art Therapist  Concentration: Poor  Attention Span: Poor  Recall: [employer] of Knowledge: Fair  Language: Fair   Psychomotor Activity  Psychomotor Activity: Psychomotor Activity: Normal   Assets  Assets: Manufacturing Systems Engineer; Desire for Improvement; Housing   Sleep  Sleep: Sleep: Fair    Physical Exam: Physical Exam ROS Blood pressure (!) 89/74, pulse (!) 108, temperature 98 F (36.7 C), temperature source Oral, resp. rate 18, height 5' 6 (1.676 m), weight 62.1 kg, SpO2 100%, unknown if currently breastfeeding. Body mass index is 22.11 kg/m.   COGNITIVE FEATURES THAT CONTRIBUTE TO RISK:  None    SUICIDE RISK:   Minimal: No identifiable suicidal ideation.  Patients presenting with no risk factors but with morbid ruminations; may be classified as minimal risk based on the severity of the depressive symptoms  PLAN OF CARE: Patient is admitted to the adult inpatient unit with every 15-minute safety monitoring.  Multidisciplinary team approach is offered.  Medication management, group/milieu therapy is offered.  I certify that inpatient services furnished can reasonably be expected to improve the patient's condition.   Camelia LITTIE Lukes, PA-C 01/28/2024, 9:19 PM

## 2024-01-28 NOTE — Plan of Care (Signed)
  Problem: Education: Goal: Emotional status will improve Outcome: Progressing Goal: Mental status will improve Outcome: Progressing   Problem: Activity: Goal: Interest or engagement in activities will improve Outcome: Not Progressing   Problem: Health Behavior/Discharge Planning: Goal: Compliance with treatment plan for underlying cause of condition will improve Outcome: Progressing   Problem: Physical Regulation: Goal: Ability to maintain clinical measurements within normal limits will improve Outcome: Progressing   Problem: Safety: Goal: Periods of time without injury will increase Outcome: Progressing

## 2024-01-28 NOTE — Progress Notes (Signed)
   01/28/24 2000  Psych Admission Type (Psych Patients Only)  Admission Status Involuntary  Psychosocial Assessment  Patient Complaints Anxiety;Worrying (Patient reports anxiety about wanting to see her kids on Sunday and worried about losing her job.)  Eye Contact Brief  Facial Expression Animated;Anxious  Affect Anxious  Speech Logical/coherent  Interaction Minimal  Motor Activity Fidgety  Appearance/Hygiene Unremarkable  Behavior Characteristics Cooperative;Anxious  Mood Anxious;Pleasant  Thought Process  Coherency Circumstantial  Content Paranoia;Blaming others (Patient reports paranoia that someone is going to break into her house, since being released from prison.)  Delusions None reported or observed  Perception WDL  Hallucination None reported or observed  Judgment Poor  Confusion None  Danger to Self  Current suicidal ideation? Denies  Danger to Others  Danger to Others None reported or observed

## 2024-01-28 NOTE — H&P (Addendum)
 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 female who presents to the emergency department under Involuntary Commitment (IVC) due to concerns for psychosis. The patient reports seeing her family being electrocuted and states she has a history of schizophrenia. She reports that her doctor started her on lithium today. The patient denies suicidal ideation (SI) and homicidal ideation (HI).  She admits to methamphetamine use four days ago. She denies alcohol or other substance use.  Collateral from husband: He reports that the patient has been seeing people outside the home--in trees and the yard--and believes people are out to get them. He notes that these symptoms are consistent with previous episodes related to methamphetamine use.  On interview today, patient endorses methamphetamine use approximately 5 days ago and cocaine use over 1 week ago.  She reported visual hallucinations upon ED presentation but now denies hallucinations.  She denies SI/HI/plan.  She denies previous suicide attempts or self-harm.  She reports decreased motivation and decreased energy, as well as depressed mood.  She denies sleep disturbance or appetite changes.  She endorses anxiety, stating she worries about many different things and has difficulty relaxing.  She reports a history of trauma related to the loss of her siblings and her father while she was in prison, states she was unable to attend their funerals. Patient states she has been out of prison for 2 years.  She reports history of 9 felony charges.  She states she has an upcoming court date in February 2026 related to a suspended license.  She reports home medication of Paxil but states she has not taken this  for about 1 week.  She also reports home medication of prazosin, provider discussed with patient this medication will be held at this time due to patient's lower blood pressure.  Patient states her blood pressure tends to run low.  She denies dizziness, lightheadedness, fatigue, or vision changes.   Provider contacted ex-husband, Bailey Cabrera, who stated he does not have any safety concerns about patient's grandfather while patient is hospitalized, reassures provider he is fine. Provider attempted to call grandfather, Bailey Cabrera, twice without success.   Total Time spent with patient: 1 hour Sleep  Sleep:Sleep: Fair  Past Psychiatric History: Schizophrenia, substance induced psychotic disorder Psychiatric History:  Information collected from the patient and chart review  Prev Dx/Sx: Schizophrenia, substance induced psychotic disorder Current Psych Provider: Yes Home Meds (current): Risperdal, Paxil, prazosin, though patient reports poor compliance Previous Med Trials: Unknown Therapy: Denies  Prior Psych Hospitalization: Denies Prior Self Harm: Denies Prior Violence: Denies  Family Psych History: Schizophrenia in mother Family Hx suicide: Denies  Social History:  Educational Hx: Ninth grade Occupational Hx: Unemployed Legal Hx: History of 9 felony charges per patient report, has court date in February 2026 for suspended license  Living Situation: Lives with grandfather Spiritual Hx: Unknown Access to weapons/lethal means: Denies  Substance History Alcohol: Denies Tobacco: Cigarettes and vape Illicit drugs: Methamphetamine, cocaine Prescription drug abuse: Denies Rehab hx: [facility], 6 to 7 years ago Is the patient at risk to self? No.  Has the patient been a risk to self in the past 6 months? No.  Has the patient been a risk to self within the distant past? No.  Is the patient a risk to others?  No.  Has the patient been a risk to others in the past 6 months? No.   Has the patient been a risk to others within the distant past? No.   Columbia Scale:  Flowsheet Row Admission (Current) from 01/27/2024 in [facility]PAT[facility] ED from 01/26/2024 in [facility] Emergency Department at [facility] ED from 12/07/2021 in [facility] Emergency Department at [facility]  C-SSRS RISK CATEGORY No Risk No Risk No Risk     Past Medical History:  Past Medical History:  Diagnosis Date   Anemia    Anxiety and depression    Asthma    Bipolar 1 disorder (HCC)    Drug use    Herpes genitalis 07/20/2014   Has been tx'd since 32weeks for suppresion   Hypotension    Marijuana use    Schizophrenia (HCC) 2022   diagnosed while in prision in 2022   Seizures (HCC)    During pregnancy; most recent April 2016    Past Surgical History:  Procedure Laterality Date   sonohystogram  2012   TUBAL LIGATION N/A 02/29/2016   Procedure: POST PARTUM TUBAL LIGATION;  Surgeon: Bailey JONETTA Mace, MD;  Location: [facility] ORS;  Service: Gynecology;  Laterality: N/A;   WISDOM TOOTH EXTRACTION Bilateral 2011   Post op infection   Family History: History reviewed. No pertinent family history.  Social History:  Social History   Substance and Sexual Activity  Alcohol Use No     Social History   Substance and Sexual Activity  Drug Use Yes   Types: Marijuana, Cocaine   Comment: Heroin last use 07/22/2019, marijuana last use 07/22/2019, meth last use 2 weeks ago      Allergies:   Allergies  Allergen Reactions   Penicillins Hives and Swelling    Throat swells.   Amoxicillin Itching, Nausea Only and Rash   Sulfamethoxazole -Trimethoprim  Nausea Only   Lab Results:  Results for orders placed or performed during the hospital encounter of 01/27/24 (from the past 48 hours)  Hemoglobin A1c     Status: None   Collection Time: 01/28/24  2:30 PM  Result Value Ref Range   Hgb A1c MFr Bld 4.8 4.8 - 5.6 %    Comment: (NOTE) Diagnosis of Diabetes The  following HbA1c ranges recommended by the American Diabetes Association (ADA) may be used as an aid in the diagnosis of diabetes mellitus.  Hemoglobin             Suggested A1C NGSP%              Diagnosis  <5.7                   Non Diabetic  5.7-6.4                Pre-Diabetic  >6.4                   Diabetic  <7.0                   Glycemic control for                       adults with diabetes.     Mean Plasma Glucose 91.06 mg/dL    Comment: Performed at [facility], [address] [place], [place] [address]  Lipid panel     Status: Abnormal   Collection Time: 01/28/24  2:30 PM  Result Value Ref Range  Cholesterol 131 0 - 200 mg/dL    Comment:        ATP III CLASSIFICATION:  <200     mg/dL   Desirable  799-760  mg/dL   Borderline High  >=759    mg/dL   High           Triglycerides 52 <150 mg/dL   HDL 36 (L) >59 mg/dL   Total CHOL/HDL Ratio 3.6 RATIO   VLDL 10 0 - 40 mg/dL   LDL Cholesterol 84 0 - 99 mg/dL    Comment:        Total Cholesterol/HDL:CHD Risk Coronary Heart Disease Risk Table                     Men   Women  1/2 Average Risk   3.4   3.3  Average Risk       5.0   4.4  2 X Average Risk   9.6   7.1  3 X Average Risk  23.4   11.0        Use the calculated Patient Ratio above and the CHD Risk Table to determine the patient's CHD Risk.        ATP III CLASSIFICATION (LDL):  <100     mg/dL   Optimal  899-870  mg/dL   Near or Above                    Optimal  130-159  mg/dL   Borderline  839-810  mg/dL   High  >809     mg/dL   Very High Performed at [facility], [address] [place], [place] [address]   TSH     Status: None   Collection Time: 01/28/24  2:30 PM  Result Value Ref Range   TSH 1.580 0.350 - 4.500 uIU/mL    Comment: Performed at [facility], [address] [place], [place] [address]    Blood Alcohol level:  Lab Results  Component Value Date   [facility] <15 01/26/2024   ETH <10 12/07/2021     Metabolic Disorder Labs:  Lab Results  Component Value Date   HGBA1C 4.8 01/28/2024   MPG 91.06 01/28/2024   No results found for: PROLACTIN Lab Results  Component Value Date   CHOL 131 01/28/2024   TRIG 52 01/28/2024   HDL 36 (L) 01/28/2024   CHOLHDL 3.6 01/28/2024   VLDL 10 01/28/2024   LDLCALC 84 01/28/2024    Current Medications: Current Facility-Administered Medications  Medication Dose Route Frequency Provider Last Rate Last Admin   acetaminophen  (TYLENOL ) tablet 650 mg  650 mg Oral Q6H PRN Smith, Annie B, NP       alum & mag hydroxide-simeth (MAALOX/MYLANTA) 200-200-20 MG/5ML suspension 30 mL  30 mL Oral Q4H PRN Smith, Annie B, NP       buprenorphine-naloxone  (SUBOXONE) 8-2 mg per SL tablet 1 tablet  1 tablet Sublingual TID Smith, Annie B, NP   1 tablet at 01/28/24 1752   haloperidol (HALDOL) tablet 5 mg  5 mg Oral TID PRN Smith, Annie B, NP       And   diphenhydrAMINE  (BENADRYL ) capsule 50 mg  50 mg Oral TID PRN Smith, Annie B, NP       haloperidol lactate (HALDOL) injection 5 mg  5 mg Intramuscular TID PRN Smith, Annie B, NP       And   diphenhydrAMINE  (BENADRYL ) injection 50 mg  50 mg Intramuscular  TID PRN Smith, Annie B, NP       And   LORazepam (ATIVAN) injection 2 mg  2 mg Intramuscular TID PRN Smith, Annie B, NP       haloperidol lactate (HALDOL) injection 10 mg  10 mg Intramuscular TID PRN Smith, Annie B, NP       And   diphenhydrAMINE  (BENADRYL ) injection 50 mg  50 mg Intramuscular TID PRN Smith, Annie B, NP       And   LORazepam (ATIVAN) injection 2 mg  2 mg Intramuscular TID PRN Smith, Annie B, NP       hydrOXYzine (ATARAX) tablet 25 mg  25 mg Oral TID PRN Smith, Annie B, NP   25 mg at 01/28/24 2112   magnesium hydroxide (MILK OF MAGNESIA) suspension 30 mL  30 mL Oral Daily PRN Smith, Annie B, NP       nicotine (NICODERM CQ - dosed in mg/24 hours) patch 21 mg  21 mg Transdermal Daily Jadapalle, Sree, MD   21 mg at 01/28/24 1241   nicotine polacrilex  (NICORETTE) gum 2 mg  2 mg Oral PRN Jadapalle, Sree, MD   2 mg at 01/28/24 1241   OLANZapine zydis (ZYPREXA) disintegrating tablet 10 mg  10 mg Oral QHS Smith, Annie B, NP   10 mg at 01/28/24 2111   traZODone (DESYREL) tablet 50 mg  50 mg Oral QHS PRN Smith, Annie B, NP       PTA Medications: Medications Prior to Admission  Medication Sig Dispense Refill Last Dose/Taking   desvenlafaxine (PRISTIQ) 25 MG 24 hr tablet Take 25 mg by mouth daily.      naloxone  (NARCAN ) nasal spray 4 mg/0.1 mL Spray intranasally for overdose 1 each 0    PARoxetine (PAXIL) 20 MG tablet Take 20 mg by mouth every evening.      prazosin (MINIPRESS) 2 MG capsule Take 2 mg by mouth at bedtime.      risperiDONE (RISPERDAL) 3 MG tablet Take 3 mg by mouth at bedtime.      SUBOXONE 8-2 MG FILM Place 1 Film under the tongue 3 (three) times daily.       Psychiatric Specialty Exam:  Presentation  General Appearance:  Disheveled  Eye Contact: Poor  Speech: Clear and Coherent  Speech Volume: Normal    Mood and Affect  Mood: Anxious; Irritable  Affect: Labile   Thought Process  Thought Processes: Linear  Descriptions of Associations:Intact  Orientation:Full (Time, Place and Person)  Thought Content:Perseveration  Hallucinations:No data recorded Ideas of Reference:None  Suicidal Thoughts:Suicidal Thoughts: No  Homicidal Thoughts:Homicidal Thoughts: No   Sensorium  Memory: Immediate Poor; Recent Poor  Judgment: Poor  Insight: Fair   Art Therapist  Concentration: Poor  Attention Span: Poor  Recall: [employer] of Knowledge: Fair  Language: Fair   Psychomotor Activity  Psychomotor Activity: Psychomotor Activity: Normal   Assets  Assets: Manufacturing Systems Engineer; Desire for Improvement; Housing    Musculoskeletal: Strength & Muscle Tone: within normal limits Gait & Station: normal  Physical Exam: Physical Exam Vitals and nursing note reviewed.   Constitutional:      Appearance: Normal appearance.  HENT:     Head: Normocephalic and atraumatic.     Nose: Nose normal.     Mouth/Throat:     Mouth: Mucous membranes are moist.  Eyes:     Conjunctiva/sclera: Conjunctivae normal.  Pulmonary:     Effort: Pulmonary effort is normal.  Neurological:     Mental Status:  She is alert and oriented to person, place, and time.  Psychiatric:        Attention and Perception: Attention normal. She does not perceive auditory or visual hallucinations.        Mood and Affect: Mood is anxious. Affect is labile and tearful.        Speech: Speech normal.        Behavior: Behavior normal.        Thought Content: Thought content is not paranoid or delusional. Thought content does not include homicidal or suicidal ideation. Thought content does not include homicidal or suicidal plan.        Cognition and Memory: Cognition normal.        Judgment: Judgment is impulsive.    ROS Blood pressure (!) 89/74, pulse (!) 108, temperature 98 F (36.7 C), temperature source Oral, resp. rate 18, height 5' 6 (1.676 m), weight 62.1 kg, SpO2 100%, unknown if currently breastfeeding. Body mass index is 22.11 kg/m.  Principal Diagnosis: Substance-induced psychotic disorder (HCC) Diagnosis:  Principal Problem:   Substance-induced psychotic disorder [facility])   Clinical Decision Making:  Treatment Plan Summary:  Safety and Monitoring:             -- Voluntary admission to inpatient psychiatric unit for safety, stabilization and treatment             -- Daily contact with patient to assess and evaluate symptoms and progress in treatment             -- Patient's case to be discussed in multi-disciplinary team meeting             -- Observation Level: q15 minute checks             -- Vital signs:  q12 hours             -- Precautions: suicide, elopement, and assault   2. Psychiatric Diagnoses and Treatment:  Substance induced psychotic disorder Zyprexa 10 mg at  bedtime Suboxone 3 times daily                -- The risks/benefits/side-effects/alternatives to this medication were discussed in detail with the patient and time was given for questions. The patient consents to medication trial.                -- Metabolic profile and EKG monitoring obtained while on an atypical antipsychotic (BMI: Lipid Panel: HbgA1c: QTc:)              -- Encouraged patient to participate in unit milieu and in scheduled group therapies                            3. Medical Issues Being Addressed:  No acute medical issues identified.   4. Discharge Planning:              -- Social work and case management to assist with discharge planning and identification of hospital follow-up needs prior to discharge             -- Estimated LOS: 5-7 days             -- Discharge Concerns: Need to establish a safety plan; Medication compliance and effectiveness             -- Discharge Goals: Return home with outpatient referrals follow ups  Physician Treatment Plan for Primary Diagnosis: Substance-induced psychotic disorder (HCC) Long Term Goal(s): Improvement  in symptoms so as ready for discharge  Short Term Goals: Ability to identify changes in lifestyle to reduce recurrence of condition will improve, Ability to demonstrate self-control will improve, Ability to identify and develop effective coping behaviors will improve, and Ability to identify triggers associated with substance abuse/mental health issues will improve  Physician Treatment Plan for Secondary Diagnosis: Principal Problem:   Substance-induced psychotic disorder (HCC)  Long Term Goal(s): Improvement in symptoms so as ready for discharge  Short Term Goals: Ability to identify changes in lifestyle to reduce recurrence of condition will improve, Ability to demonstrate self-control will improve, Ability to identify and develop effective coping behaviors will improve, and Ability to identify triggers associated with  substance abuse/mental health issues will improve  I certify that inpatient services furnished can reasonably be expected to improve the patient's condition.    Camelia LITTIE Lukes, PA-C 11/14/20259:30 PM

## 2024-01-28 NOTE — [facility] Suicide Risk Assessment (Signed)
 [facility] INPATIENT:  Family/Significant Other Suicide Prevention Education  Suicide Prevention Education:  Patient Refusal for Family/Significant Other Suicide Prevention Education: The patient Bailey Cabrera has refused to provide written consent for family/significant other to be provided Family/Significant Other Suicide Prevention Education during admission and/or prior to discharge.  Physician notified.  SPE completed with pt, as pt refused to consent to family contact. SPI pamphlet provided to pt and pt was encouraged to share information with support network, ask questions, and talk about any concerns relating to SPE. Pt denies access to guns/firearms and verbalized understanding of information provided. Mobile Crisis information also provided to pt.  Nadara JONELLE Fam 01/28/2024, 1:52 PM

## 2024-01-28 NOTE — Plan of Care (Signed)
   Problem: Safety: Goal: Periods of time without injury will increase Outcome: Progressing

## 2024-01-28 NOTE — Tx Team (Signed)
 Initial Treatment Plan 01/28/2024 2:16 AM [name] FMW:[id number]    PATIENT STRESSORS: Medication change or noncompliance   Substance abuse     PATIENT STRENGTHS: Motivation for treatment/growth  Supportive family/friends    PATIENT IDENTIFIED PROBLEMS: Psychosis     Substance abuse                  DISCHARGE CRITERIA:  Improved stabilization in mood, thinking, and/or behavior  PRELIMINARY DISCHARGE PLAN: Outpatient therapy  PATIENT/FAMILY INVOLVEMENT: This treatment plan has been presented to and reviewed with the patient, Bailey Cabrera, The patient and family have been given the opportunity to ask questions and make suggestions.  Jannett Lorelie Gary, RN 01/28/2024, 2:16 AM

## 2024-01-29 ENCOUNTER — Inpatient Hospital Stay

## 2024-01-29 DIAGNOSIS — I959 Hypotension, unspecified: Secondary | ICD-10-CM | POA: Insufficient documentation

## 2024-01-29 DIAGNOSIS — R079 Chest pain, unspecified: Secondary | ICD-10-CM

## 2024-01-29 DIAGNOSIS — F19959 Other psychoactive substance use, unspecified with psychoactive substance-induced psychotic disorder, unspecified: Secondary | ICD-10-CM | POA: Diagnosis not present

## 2024-01-29 LAB — COMPREHENSIVE METABOLIC PANEL WITH GFR
ALT: 6 U/L (ref 0–44)
AST: 11 U/L — ABNORMAL LOW (ref 15–41)
Albumin: 3.9 g/dL (ref 3.5–5.0)
Alkaline Phosphatase: 58 U/L (ref 38–126)
Anion gap: 7 (ref 5–15)
BUN: 8 mg/dL (ref 6–20)
CO2: 25 mmol/L (ref 22–32)
Calcium: 8.7 mg/dL — ABNORMAL LOW (ref 8.9–10.3)
Chloride: 103 mmol/L (ref 98–111)
Creatinine, Ser: 0.52 mg/dL (ref 0.44–1.00)
GFR, Estimated: >60 mL/min (ref >60)
Glucose, Bld: 121 mg/dL — ABNORMAL HIGH (ref 70–99)
Potassium: 3.8 mmol/L (ref 3.5–5.1)
Sodium: 135 mmol/L (ref 135–145)
Total Bilirubin: 0.2 mg/dL (ref 0.0–1.2)
Total Protein: 6.5 g/dL (ref 6.5–8.1)

## 2024-01-29 LAB — TROPONIN T, HIGH SENSITIVITY
Troponin T High Sensitivity: <15 ng/L (ref 0–19)
Troponin T High Sensitivity: <15 ng/L (ref 0–19)

## 2024-01-29 MED ORDER — OLANZAPINE 5 MG PO TBDP
5.0000 mg | ORAL_TABLET | Freq: Every day | ORAL | Status: DC
  Administered 2024-01-29 – 2024-01-30 (×2): 5 mg via ORAL
  Filled 2024-01-29 (×2): qty 1

## 2024-01-29 NOTE — Progress Notes (Signed)
   01/29/24 1500  Psych Admission Type (Psych Patients Only)  Admission Status Involuntary  Psychosocial Assessment  Patient Complaints Anxiety  Eye Contact Brief  Facial Expression Anxious  Affect Anxious  Speech Logical/coherent  Interaction Assertive  Motor Activity Fidgety  Appearance/Hygiene Unremarkable  Behavior Characteristics Cooperative  Mood Anxious  Thought Process  Coherency WDL  Content Paranoia  Delusions None reported or observed  Perception WDL  Hallucination None reported or observed  Judgment Poor  Confusion None  Danger to Self  Current suicidal ideation? Denies  Danger to Others  Danger to Others None reported or observed

## 2024-01-29 NOTE — Plan of Care (Signed)

## 2024-01-29 NOTE — Group Note (Signed)
 Date:  01/29/2024 Time:  11:07 AM  Group Topic/Focus:  Goals Group:   The focus of this group is to help patients establish daily goals to achieve during treatment and discuss how the patient can incorporate goal setting into their daily lives to aide in recovery.    Participation Level:  Did Not Attend   Deitra Clap [facility] 01/29/2024, 11:07 AM

## 2024-01-29 NOTE — Plan of Care (Signed)

## 2024-01-29 NOTE — Progress Notes (Signed)
 Pt complained of feeling unwell during AM med pass, vitals rechecked, due to hypotension pt was encouraged to drink fluids and provided with Gatorade.  Pt later stated that she had a pain in her right upper chest.  Attending Psych NP notified.  Hospitalist consulted due to chest discomfort and hypotension.  Hospitalist evaluated patient and and encouraged water intake and continued provision of gatorade.This was reinforced with the patient.  Patient was observed to be more active in the milieu throughout the afternoon, denied pain, and confirmed she was trying to drink more fluids.

## 2024-01-29 NOTE — Assessment & Plan Note (Signed)
 I specked this is secondary to poor p.o. intake.  I agree with nursing, giving patient Gatorade bottles, 20 ounce bottles, and encouraging water intake.  I counseled patient that she needs to drink at least 3 of those bottles per day.

## 2024-01-29 NOTE — Assessment & Plan Note (Signed)
 Per primary team

## 2024-01-29 NOTE — Assessment & Plan Note (Addendum)
 Patient describes the chest pain as sharp Check portable chest x-ray, high sensitive troponin, CMP, and repeat EKG Message nursing to obtain repeat EKG  Update: Chest x-ray was negative for acute cardiopulmonary process, EKG showed sinus rhythm, troponin was negative.  CMP was unremarkable.  I suspect patient's chest pain is related to anxiety with possibility of withdrawal from methamphetamine and psychosomatic disturbances.  Primary team can consider as needed lidocaine  patch daily.  I would avoid all narcotic agents.  NSAID can be an alternative though I would avoid scheduled daily use due to side effects including renal injury as patient does not have an appetite and is not eating very well.

## 2024-01-29 NOTE — Progress Notes (Signed)
 [facility] MD Progress Note  01/29/2024 6:40 PM Bailey Cabrera  MRN:  [id number]   Subjective:  Chart reviewed, case discussed in multidisciplinary meeting, patient seen during rounds.   11/15: Upon assessment today patient is found on the phone. Patient is able to engage with clinical research associate and expresses desire to go home. Patient reports her children who reside in [place]  are coming in tomorrow and she does not get to see them often. She then reports they are leaving Monday night. She reports that she lives with her pawpaw and he has dementia. He has dementia and I have schizophrenia, so we both see things. She then reports that they exacerbate each other seeing things. She denies AVH since being in the hospital. Patient minimizing substance use - when asked directly about methamphetamines, she reports she began using because of trauma. She reports she had sobriety for a period of time until her and her husband split up. Patient reporting she was told her stay would only be 3-4 days by the police that brought her to the hospital. Patient also reporting increased sedation with night medication. Agreed to decrease dosing at this time to see if improvement with sedation. Patient denies SI, HI, and AVH.   Of note, patient's BP has been running low. Today it was low, with elevated HR, and she began reporting right chest pain. Hospitalist consulted. No significant findings.   From Admission: On interview today, patient endorses methamphetamine use approximately 5 days ago and cocaine use over 1 week ago.  She reported visual hallucinations upon ED presentation but now denies hallucinations.  She denies SI/HI/plan.  She denies previous suicide attempts or self-harm.  She reports decreased motivation and decreased energy, as well as depressed mood.  She denies sleep disturbance or appetite changes.  She endorses anxiety, stating she worries about many different things and has difficulty relaxing.  She reports a  history of trauma related to the loss of her siblings and her father while she was in prison, states she was unable to attend their funerals. Patient states she has been out of prison for 2 years.  She reports history of 9 felony charges.  She states she has an upcoming court date in February 2026 related to a suspended license.  She reports home medication of Paxil but states she has not taken this for about 1 week.  She also reports home medication of prazosin, provider discussed with patient this medication will be held at this time due to patient's lower blood pressure.  Patient states her blood pressure tends to run low.  She denies dizziness, lightheadedness, fatigue, or vision changes.  Past Psychiatric History: see h&P Family History: History reviewed. No pertinent family history. Social History:  Social History   Substance and Sexual Activity  Alcohol Use No     Social History   Substance and Sexual Activity  Drug Use Yes   Types: Marijuana, Cocaine   Comment: Heroin last use 07/22/2019, marijuana last use 07/22/2019, meth last use 2 weeks ago    Social History   Socioeconomic History   Marital status: Married    Spouse name: Not on file   Number of children: 4   Years of education: Not on file   Highest education level: Not on file  Occupational History   Not on file  Tobacco Use   Smoking status: Every Day    Current packs/day: 1.50    Average packs/day: 1.5 packs/day for 5.0 years (7.5 ttl pk-yrs)  Types: Cigarettes   Smokeless tobacco: Never  Vaping Use   Vaping status: Never Used  Substance and Sexual Activity   Alcohol use: No   Drug use: Yes    Types: Marijuana, Cocaine    Comment: Heroin last use 07/22/2019, marijuana last use 07/22/2019, meth last use 2 weeks ago   Sexual activity: Not Currently    Birth control/protection: Surgical  Other Topics Concern   Not on file  Social History Narrative   Not on file   Social Drivers of Health   Financial  Resource Strain: Low Risk (07/07/2022)   Received from [facility]   Overall Financial Resource Strain (CARDIA)    Difficulty of Paying Living Expenses: Not hard at all  Food Insecurity: No Food Insecurity (01/28/2024)   Hunger Vital Sign    Worried About Running Out of Food in the Last Year: Never true    Ran Out of Food in the Last Year: Never true  Transportation Needs: No Transportation Needs (01/28/2024)   PRAPARE - Administrator, Civil Service (Medical): No    Lack of Transportation (Non-Medical): No  Physical Activity: Not on file  Stress: Not on file  Social Connections: Moderately Integrated (01/28/2024)   Social Connection and Isolation Panel    Frequency of Communication with Friends and Family: Twice a week    Frequency of Social Gatherings with Friends and Family: Twice a week    Attends Religious Services: 1 to 4 times per year    Active Member of Clubs or Organizations: Yes    Attends Engineer, Structural: More than 4 times per year    Marital Status: Never married   Past Medical History:  Past Medical History:  Diagnosis Date   Anemia    Anxiety and depression    Asthma    Bipolar 1 disorder (HCC)    Drug use    Herpes genitalis 07/20/2014   Has been tx'd since 32weeks for suppresion   Hypotension    Marijuana use    Schizophrenia (HCC) 2022   diagnosed while in prision in 2022   Seizures (HCC)    During pregnancy; most recent April 2016    Past Surgical History:  Procedure Laterality Date   sonohystogram  2012   TUBAL LIGATION N/A 02/29/2016   Procedure: POST PARTUM TUBAL LIGATION;  Surgeon: Garnette JONETTA Mace, MD;  Location: [facility] ORS;  Service: Gynecology;  Laterality: N/A;   WISDOM TOOTH EXTRACTION Bilateral 2011   Post op infection    Current Medications: Current Facility-Administered Medications  Medication Dose Route Frequency Provider Last Rate Last Admin   acetaminophen  (TYLENOL ) tablet 650 mg  650 mg Oral Q6H PRN  Smith, Annie B, NP   650 mg at 01/29/24 1127   alum & mag hydroxide-simeth (MAALOX/MYLANTA) 200-200-20 MG/5ML suspension 30 mL  30 mL Oral Q4H PRN Smith, Annie B, NP       buprenorphine-naloxone  (SUBOXONE) 8-2 mg per SL tablet 1 tablet  1 tablet Sublingual TID Smith, Annie B, NP   1 tablet at 01/29/24 1700   haloperidol (HALDOL) tablet 5 mg  5 mg Oral TID PRN Smith, Annie B, NP       And   diphenhydrAMINE  (BENADRYL ) capsule 50 mg  50 mg Oral TID PRN Smith, Annie B, NP       haloperidol lactate (HALDOL) injection 5 mg  5 mg Intramuscular TID PRN Smith, Annie B, NP       And   diphenhydrAMINE  (  BENADRYL ) injection 50 mg  50 mg Intramuscular TID PRN Smith, Annie B, NP       And   LORazepam (ATIVAN) injection 2 mg  2 mg Intramuscular TID PRN Smith, Annie B, NP       haloperidol lactate (HALDOL) injection 10 mg  10 mg Intramuscular TID PRN Smith, Annie B, NP       And   diphenhydrAMINE  (BENADRYL ) injection 50 mg  50 mg Intramuscular TID PRN Smith, Annie B, NP       And   LORazepam (ATIVAN) injection 2 mg  2 mg Intramuscular TID PRN Smith, Annie B, NP       hydrOXYzine (ATARAX) tablet 25 mg  25 mg Oral TID PRN Smith, Annie B, NP   25 mg at 01/28/24 2112   magnesium hydroxide (MILK OF MAGNESIA) suspension 30 mL  30 mL Oral Daily PRN Smith, Annie B, NP       nicotine (NICODERM CQ - dosed in mg/24 hours) patch 21 mg  21 mg Transdermal Daily Jadapalle, Sree, MD   21 mg at 01/29/24 0944   nicotine polacrilex (NICORETTE) gum 2 mg  2 mg Oral PRN Jadapalle, Sree, MD   2 mg at 01/28/24 1241   OLANZapine zydis (ZYPREXA) disintegrating tablet 5 mg  5 mg Oral QHS Evelin Cake, NP       traZODone (DESYREL) tablet 50 mg  50 mg Oral QHS PRN Smith, Annie B, NP        Lab Results:  Results for orders placed or performed during the hospital encounter of 01/27/24 (from the past 48 hours)  Hemoglobin A1c     Status: None   Collection Time: 01/28/24  2:30 PM  Result Value Ref Range   Hgb A1c MFr Bld 4.8 4.8 - 5.6 %     Comment: (NOTE) Diagnosis of Diabetes The following HbA1c ranges recommended by the American Diabetes Association (ADA) Nyssa Sayegh be used as an aid in the diagnosis of diabetes mellitus.  Hemoglobin             Suggested A1C NGSP%              Diagnosis  <5.7                   Non Diabetic  5.7-6.4                Pre-Diabetic  >6.4                   Diabetic  <7.0                   Glycemic control for                       adults with diabetes.     Mean Plasma Glucose 91.06 mg/dL    Comment: Performed at [facility], [address] [place], [place] [address]  Lipid panel     Status: Abnormal   Collection Time: 01/28/24  2:30 PM  Result Value Ref Range   Cholesterol 131 0 - 200 mg/dL    Comment:        ATP III CLASSIFICATION:  <200     mg/dL   Desirable  799-760  mg/dL   Borderline High  >=759    mg/dL   High           Triglycerides 52 <150 mg/dL   HDL 36 (L) >59 mg/dL  Total CHOL/HDL Ratio 3.6 RATIO   VLDL 10 0 - 40 mg/dL   LDL Cholesterol 84 0 - 99 mg/dL    Comment:        Total Cholesterol/HDL:CHD Risk Coronary Heart Disease Risk Table                     Men   Women  1/2 Average Risk   3.4   3.3  Average Risk       5.0   4.4  2 X Average Risk   9.6   7.1  3 X Average Risk  23.4   11.0        Use the calculated Patient Ratio above and the CHD Risk Table to determine the patient's CHD Risk.        ATP III CLASSIFICATION (LDL):  <100     mg/dL   Optimal  899-870  mg/dL   Near or Above                    Optimal  130-159  mg/dL   Borderline  839-810  mg/dL   High  >809     mg/dL   Very High Performed at [facility], [address] [place], [place] [address]   TSH     Status: None   Collection Time: 01/28/24  2:30 PM  Result Value Ref Range   TSH 1.580 0.350 - 4.500 uIU/mL    Comment: Performed at [facility], [address] [place], [place] [address]  Troponin T, High Sensitivity     Status: None   Collection Time:  01/29/24  1:22 PM  Result Value Ref Range   Troponin T High Sensitivity <15 0 - 19 ng/L    Comment: (NOTE) Biotin concentrations > 1000 ng/mL falsely decrease TnT results.  Serial cardiac troponin measurements are suggested.  Refer to the Links section for chest pain algorithms and additional  guidance. Performed at [facility], [address] [place], [place] [address]   Comprehensive metabolic panel     Status: Abnormal   Collection Time: 01/29/24  1:22 PM  Result Value Ref Range   Sodium 135 135 - 145 mmol/L   Potassium 3.8 3.5 - 5.1 mmol/L   Chloride 103 98 - 111 mmol/L   CO2 25 22 - 32 mmol/L   Glucose, Bld 121 (H) 70 - 99 mg/dL    Comment: Glucose reference range applies only to samples taken after fasting for at least 8 hours.   BUN 8 6 - 20 mg/dL   Creatinine, Ser 9.47 0.44 - 1.00 mg/dL   Calcium 8.7 (L) 8.9 - 10.3 mg/dL   Total Protein 6.5 6.5 - 8.1 g/dL   Albumin 3.9 3.5 - 5.0 g/dL   AST 11 (L) 15 - 41 U/L   ALT 6 0 - 44 U/L   Alkaline Phosphatase 58 38 - 126 U/L   Total Bilirubin 0.2 0.0 - 1.2 mg/dL   GFR, Estimated >39 >39 mL/min    Comment: (NOTE) Calculated using the CKD-EPI Creatinine Equation (2021)    Anion gap 7 5 - 15    Comment: Performed at [facility], [address] [place], [place] [address]  Troponin T, High Sensitivity     Status: None   Collection Time: 01/29/24  2:25 PM  Result Value Ref Range   Troponin T High Sensitivity <15 0 - 19 ng/L    Comment: (NOTE) Biotin concentrations > 1000  ng/mL falsely decrease TnT results.  Serial cardiac troponin measurements are suggested.  Refer to the Links section for chest pain algorithms and additional  guidance. Performed at [facility], [address] [place], [place] [address]     Blood Alcohol level:  Lab Results  Component Value Date   [facility] <15 01/26/2024   ETH <10 12/07/2021    Metabolic Disorder Labs: Lab Results  Component Value Date   HGBA1C 4.8  01/28/2024   MPG 91.06 01/28/2024   No results found for: PROLACTIN Lab Results  Component Value Date   CHOL 131 01/28/2024   TRIG 52 01/28/2024   HDL 36 (L) 01/28/2024   CHOLHDL 3.6 01/28/2024   VLDL 10 01/28/2024   LDLCALC 84 01/28/2024    Physical Findings: AIMS:  , ,  ,  ,    CIWA:    COWS:      Psychiatric Specialty Exam:  Presentation  General Appearance:  Disheveled  Eye Contact: Poor  Speech: Clear and Coherent  Speech Volume: Normal    Mood and Affect  Mood: Irritable  Affect: Congruent   Thought Process  Thought Processes: Linear  Orientation:Full (Time, Place and Person)  Thought Content:WDL  Hallucinations:Hallucinations: None  Ideas of Reference:None  Suicidal Thoughts:Suicidal Thoughts: No  Homicidal Thoughts:Homicidal Thoughts: No   Sensorium  Memory: Immediate Poor; Recent Poor  Judgment: Poor  Insight: Poor   Executive Functions  Concentration: Fair  Attention Span: Fair  Recall: [employer] of Knowledge: Fair  Language: Fair   Psychomotor Activity  Psychomotor Activity: Psychomotor Activity: Normal  Musculoskeletal: Strength & Muscle Tone: within normal limits Gait & Station: normal Assets  Assets: Manufacturing Systems Engineer; Housing    Physical Exam: Physical Exam Vitals and nursing note reviewed.  Constitutional:      Appearance: Normal appearance.  Pulmonary:     Effort: Pulmonary effort is normal.  Neurological:     Mental Status: She is alert and oriented to person, place, and time.    Review of Systems  Eyes:  Negative for blurred vision.  Respiratory:  Negative for shortness of breath.   Cardiovascular:  Positive for chest pain.  Gastrointestinal:  Negative for diarrhea, nausea and vomiting.  Neurological:  Negative for dizziness.  Psychiatric/Behavioral:  Positive for substance abuse. Negative for hallucinations and suicidal ideas. The patient is nervous/anxious.   All other  systems reviewed and are negative.  Blood pressure 95/69, pulse 97, temperature 98.3 F (36.8 C), resp. rate 20, height 5' 6 (1.676 m), weight 62.1 kg, SpO2 98%, unknown if currently breastfeeding. Body mass index is 22.11 kg/m.  Diagnosis: Principal Problem:   Substance-induced psychotic disorder (HCC) Active Problems:   Anxiety   Depression   Hypotension   Chest pain   PLAN: Safety and Monitoring:  -- Involuntary admission to inpatient psychiatric unit for safety, stabilization and treatment  -- Daily contact with patient to assess and evaluate symptoms and progress in treatment  -- Patient's case to be discussed in multi-disciplinary team meeting  -- Observation Level : q15 minute checks  -- Vital signs:  q12 hours  -- Precautions: suicide, elopement, and assault -- Encouraged patient to participate in unit milieu and in scheduled group therapies  2. Psychiatric Treatment:  Scheduled Medications: Decrease Zyprexa to 5 mg nightly Suboxone TID Nicotine replacement    -- The risks/benefits/side-effects/alternatives to this medication were discussed in detail with the patient and time was given for questions. The patient consents to medication trial.  3. Medical Issues  Being Addressed:  Hospitalist consulted for chest pain/low BP.    4. Discharge Planning:   -- Social work and case management to assist with discharge planning and identification of hospital follow-up needs prior to discharge  -- Estimated LOS: 5-7 days  Junius Faucett, NP 01/29/2024, 6:40 PM

## 2024-01-29 NOTE — Group Note (Signed)
 LCSW Group Therapy Note  Group Date: 01/29/2024 Start Time: 1100 End Time: 1200   Type of Therapy and Topic:  Group Therapy: Positive Affirmations  Participation Level:  Did Not Attend   Description of Group:   This group addressed positive affirmation towards self and others.  Patients went around the room and identified two positive things about themselves and two positive things about a peer in the room.  Patients reflected on how it felt to share something positive with others, to identify positive things about themselves, and to hear positive things from others/ Patients were encouraged to have a daily reflection of positive characteristics or circumstances.   Therapeutic Goals: Patients will verbalize two of their positive qualities Patients will demonstrate empathy for others by stating two positive qualities about a peer in the group Patients will verbalize their feelings when voicing positive self affirmations and when voicing positive affirmations of others Patients will discuss the potential positive impact on their wellness/recovery of focusing on positive traits of self and others.  Summary of Patient Progress:  Patient did not attend.   Therapeutic Modalities:   Cognitive Behavioral Therapy Motivational Interviewing    [name], [place] 01/29/2024  1:53 PM

## 2024-01-29 NOTE — Progress Notes (Signed)
   01/29/24 2137  Psych Admission Type (Psych Patients Only)  Admission Status Involuntary  Psychosocial Assessment  Patient Complaints Anxiety;Other (Comment) (Patient reports ongoing paranoia - feeling like someone is watching her in the evenings in her room. She reports this is fairly common for her.)  Eye Contact Brief  Facial Expression Anxious  Affect Anxious  Speech Logical/coherent  Interaction Assertive  Motor Activity Fidgety  Appearance/Hygiene Unremarkable  Behavior Characteristics Cooperative;Anxious  Mood Anxious;Pleasant  Thought Process  Coherency Circumstantial  Content Paranoia  Delusions None reported or observed  Perception WDL  Hallucination None reported or observed  Judgment Poor  Confusion None  Danger to Self  Current suicidal ideation? Denies  Danger to Others  Danger to Others None reported or observed

## 2024-01-29 NOTE — Group Note (Signed)
 Date:  01/29/2024 Time:  5:28 PM  Group Topic/Focus:  Wellness Toolbox:   The focus of this group is to discuss various aspects of wellness, balancing those aspects and exploring ways to increase the ability to experience wellness.  Patients will create a wellness toolbox for use upon discharge.    Participation Level:  Did Not Attend   Deitra Clap [facility] 01/29/2024, 5:28 PM

## 2024-01-29 NOTE — Hospital Course (Signed)
 Bailey Cabrera is a 33 year old female with history of psychosis, chronic low blood pressure, history of schizophrenia with visual hallucinations, who presented to the emergency department on 01/26/24, for chief concerns of IVC for psychosis.  Medicine was consulted for chief concerns of hypotension and chest pain.  Vitals at the time of consultation showed temperature 98.3, respiration rate 20, heart rate of 112, blood pressure 89/71, SpO2 97% on room air.  No labs were obtained on day of medicine consultation.

## 2024-01-29 NOTE — Consult Note (Signed)
 Consultation Note   Patient: Bailey Cabrera FMW:[id number] DOB: 12/09/1990 PCP: [facility] DOA: 01/27/2024 DOS: the patient was seen and examined on 01/29/2024 Primary service: Donnelly Mellow, MD  Referring provider: Glenys Beal, [facility] NP Reason for consult: Chest pain, hypotension.  HPI: Bailey Cabrera is a 33 year old female with history of psychosis, chronic low blood pressure, history of schizophrenia with visual hallucinations, who presented to the emergency department on 01/26/24, for chief concerns of IVC for psychosis.  Medicine was consulted for chief concerns of hypotension and chest pain.  Vitals at the time of consultation showed temperature 98.3, respiration rate 20, heart rate of 112, blood pressure 89/71, SpO2 97% on room air.  No labs were obtained on day of medicine consultation. -------------------------------------- At bedside, patient was able to tell me her first and last name, age, location, current calendar year.  She does not appear to be in acute distress.  She was able to walk to her room and sit down and then lay down in her bed without difficulty.  She was unable to get up from the laying position to the standing position without any signs of dizziness or difficulty.  The entirety of the medical exam, she does not appear to be in acute distress.  She initially denies having chest pain then she states that 'my chest is always hurting'.  She states the pain is always severe and it is sharp.  She denies any trauma to her person.  She endorses decreased appetite over the last few days.  When I ask her why she does not have an appetite, she states she does not know.  She reports the food is not bad here.  She denies fever, shortness of breath, dysuria, hematuria, diarrhea.  She denies blood in her stool.  Social history: Patient denies tobacco and EtOH use.  She endorses recreational drug use, methamphetamine.  ROS: Constitutional: no weight  change, no fever ENT/Mouth: no sore throat, no rhinorrhea Eyes: no eye pain, no vision changes Cardiovascular: + chest pain, no dyspnea,  no edema, no palpitations Respiratory: no cough, no sputum, no wheezing Gastrointestinal: no nausea, no vomiting, no diarrhea, no constipation Genitourinary: no urinary incontinence, no dysuria, no hematuria Musculoskeletal: no arthralgias, no myalgias Skin: no skin lesions, no pruritus, Neuro: + weakness, no loss of consciousness, no syncope Psych: no anxiety, no depression, + decrease appetite Heme/Lymph: no bruising, no bleeding  Assessment/Plan  Principal Problem:   Substance-induced psychotic disorder (HCC) Active Problems:   Chest pain   Anxiety   Depression   Hypotension   Assessment and Plan:  * Substance-induced psychotic disorder (HCC) Per primary team  Chest pain Patient describes the chest pain as sharp Check portable chest x-ray, high sensitive troponin, CMP, and repeat EKG Message nursing to obtain repeat EKG  Update: Chest x-ray was negative for acute cardiopulmonary process, EKG showed sinus rhythm, troponin was negative.  CMP was unremarkable.  I suspect patient's chest pain is related to anxiety with possibility of withdrawal from methamphetamine and psychosomatic disturbances.  Primary team can consider as needed lidocaine  patch daily.  I would avoid all narcotic agents.  NSAID can be an alternative though I would avoid scheduled daily use due to side effects including renal injury as patient does not have an appetite and is not eating very well.  Hypotension I specked this is secondary to poor p.o. intake.  I agree with nursing, giving patient Gatorade bottles, 20 ounce bottles, and encouraging water intake.  I  counseled patient that she needs to drink at least 3 of those bottles per day.  Depression Per primary team  Anxiety Per primary team  The above have been discussed with the primary team.  TRH will sign  off at present, please call us  again when needed.  Chart reviewed.   DVT prophylaxis: Ambulate as tolerated, per primary team otherwise Code Status: Full code Diet: Regular diet Disposition Plan: Per primary team  Past Medical History:  Diagnosis Date   Anemia    Anxiety and depression    Asthma    Bipolar 1 disorder (HCC)    Drug use    Herpes genitalis 07/20/2014   Has been tx'd since 32weeks for suppresion   Hypotension    Marijuana use    Schizophrenia (HCC) 2022   diagnosed while in prision in 2022   Seizures (HCC)    During pregnancy; most recent April 2016   Past Surgical History:  Procedure Laterality Date   sonohystogram  2012   TUBAL LIGATION N/A 02/29/2016   Procedure: POST PARTUM TUBAL LIGATION;  Surgeon: Garnette JONETTA Mace, MD;  Location: [facility] ORS;  Service: Gynecology;  Laterality: N/A;   WISDOM TOOTH EXTRACTION Bilateral 2011   Post op infection   Social History:  reports that she has been smoking cigarettes. She has a 7.5 pack-year smoking history. She has never used smokeless tobacco. She reports current drug use. Drugs: Marijuana and Cocaine. She reports that she does not drink alcohol.  Allergies  Allergen Reactions   Penicillins Hives and Swelling    Throat swells.   Amoxicillin Itching, Nausea Only and Rash   Sulfamethoxazole -Trimethoprim  Nausea Only   History reviewed. No pertinent family history.  Prior to Admission medications   Medication Sig Start Date End Date Taking? Authorizing Provider  desvenlafaxine (PRISTIQ) 25 MG 24 hr tablet Take 25 mg by mouth daily. 01/11/24   [provider]  naloxone  (NARCAN ) nasal spray 4 mg/0.1 mL Spray intranasally for overdose 06/19/19   Jessup, Charles, MD  PARoxetine (PAXIL) 20 MG tablet Take 20 mg by mouth every evening.    [provider]  prazosin (MINIPRESS) 2 MG capsule Take 2 mg by mouth at bedtime. 01/11/24   [provider]  risperiDONE (RISPERDAL) 3 MG tablet Take 3 mg by  mouth at bedtime. 01/11/24   [provider]  SUBOXONE 8-2 MG FILM Place 1 Film under the tongue 3 (three) times daily.    [provider]  albuterol  (PROVENTIL  HFA;VENTOLIN  HFA) 108 (90 BASE) MCG/ACT inhaler Inhale 2 puffs into the lungs every 4 (four) hours as needed for wheezing or shortness of breath. 11/27/14 06/19/15  Servando Hire, MD   Physical Exam: Vitals:   01/29/24 0635 01/29/24 0639 01/29/24 0912 01/29/24 1127  BP: (!) 91/58 (!) 89/65 94/65 (!) 89/71  Pulse: (!) 124 (!) 109 100 (!) 112  Resp: 20 20    Temp: 98.7 F (37.1 C)   98.3 F (36.8 C)  TempSrc: Oral     SpO2: 98% 96% 98% 97%  Weight:      Height:       Physical Exam Constitutional:      Appearance: She is normal weight.  HENT:     Head: Normocephalic and atraumatic.     Right Ear: External ear normal.     Left Ear: External ear normal.     Nose: Nose normal.  Eyes:     Extraocular Movements: Extraocular movements intact.     Conjunctiva/sclera: Conjunctivae  normal.     Pupils: Pupils are equal, round, and reactive to light.  Cardiovascular:     Rate and Rhythm: Normal rate and regular rhythm.     Pulses: Normal pulses.     Heart sounds: Normal heart sounds.  Pulmonary:     Effort: Pulmonary effort is normal.     Breath sounds: Normal breath sounds.  Abdominal:     General: Abdomen is flat. Bowel sounds are normal. There is no distension.     Tenderness: There is no abdominal tenderness.  Musculoskeletal:        General: Normal range of motion.     Cervical back: Normal range of motion and neck supple.  Skin:    General: Skin is warm and dry.     Capillary Refill: Capillary refill takes less than 2 seconds.  Neurological:     General: No focal deficit present.     Mental Status: She is alert and oriented to person, place, and time. Mental status is at baseline.  Psychiatric:        Mood and Affect: Mood normal.   Data Reviewed: CMP, Troponin, and chest x-ray were  reviewed.  Primary team communication: yes via secure chat. Thank you very much for involving us  in the care of your patient.  Author: Dr. Sherre 01/29/2024 2:52 PM  For on call review [email or web address].

## 2024-01-30 DIAGNOSIS — F19959 Other psychoactive substance use, unspecified with psychoactive substance-induced psychotic disorder, unspecified: Secondary | ICD-10-CM | POA: Diagnosis not present

## 2024-01-30 NOTE — Group Note (Signed)
 Date:  01/30/2024 Time:  5:36 AM  Group Topic/Focus:  Emotional Education:   The focus of this group is to discuss what feelings/emotions are, and how they are experienced. Identifying Needs:   The focus of this group is to help patients identify their personal needs that have been historically problematic and identify healthy behaviors to address their needs. Making Healthy Choices:   The focus of this group is to help patients identify negative/unhealthy choices they were using prior to admission and identify positive/healthier coping strategies to replace them upon discharge. Self Care:   The focus of this group is to help patients understand the importance of self-care in order to improve or restore emotional, physical, spiritual, interpersonal, and financial health.    Participation Level:  Active  Participation Quality:  Appropriate and Attentive  Affect:  Appropriate  Cognitive:  Alert, Appropriate, and Oriented  Insight: Appropriate and Good  Engagement in Group:  Engaged  Modes of Intervention:  Discussion and Support  Additional Comments:  N/A  Bailey Cabrera 01/30/2024, 5:36 AM

## 2024-01-30 NOTE — Progress Notes (Signed)
 [facility] MD Progress Note  01/30/2024 12:26 PM Bailey Cabrera  MRN:  [id number]   Subjective:  Chart reviewed, case discussed in multidisciplinary meeting, patient seen during rounds.   11/16: Upon assessment, patient presents as tired/somnolent. She reports they gave me a shot. Patient denies SI, HI, and AVH. She does not appear to be responding to external stimuli nor is there evidence of paranoia/delusions at this time. Patient does not want to talk further and wants to sleep. Encouraged fluids and intake of food.   Per nursing report, overnight patient presented paranoid and was experiencing AVH. She brought her bed into the hallway due to anxiety/fear of hallucinations (see nursing note). Due to acute distress and anxiety, patient was medication utilizing agitation protocol. She was given 10 mg Haldol, 2 mg Ativan, and 50 mg Benadryl  IM (0018 am). This was a few hours after receiving nightly PO Zyprexa 5 mg.   Due to current level of somnolence and patient likely metabolizing IM medications, Suboxone dosing held to avoid respiratory depression. Can resume once patient more awake/less somnolence. At request, nursing updated vitals. Vitals remain consistent with previous values. Vitals Q2 hours for remainder of day until patient more awake.   11/15: Upon assessment today patient is found on the phone. Patient is able to engage with clinical research associate and expresses desire to go home. Patient reports her children who reside in [place]  are coming in tomorrow and she does not get to see them often. She then reports they are leaving Monday night. She reports that she lives with her pawpaw and he has dementia. He has dementia and I have schizophrenia, so we both see things. She then reports that they exacerbate each other seeing things. She denies AVH since being in the hospital. Patient minimizing substance use - when asked directly about methamphetamines, she reports she began using because of trauma. She  reports she had sobriety for a period of time until her and her husband split up. Patient reporting she was told her stay would only be 3-4 days by the police that brought her to the hospital. Patient also reporting increased sedation with night medication. Agreed to decrease dosing at this time to see if improvement with sedation. Patient denies SI, HI, and AVH.   Of note, patient's BP has been running low. Today it was low, with elevated HR, and she began reporting right chest pain. Hospitalist consulted. No significant findings.   From Admission: On interview today, patient endorses methamphetamine use approximately 5 days ago and cocaine use over 1 week ago.  She reported visual hallucinations upon ED presentation but now denies hallucinations.  She denies SI/HI/plan.  She denies previous suicide attempts or self-harm.  She reports decreased motivation and decreased energy, as well as depressed mood.  She denies sleep disturbance or appetite changes.  She endorses anxiety, stating she worries about many different things and has difficulty relaxing.  She reports a history of trauma related to the loss of her siblings and her father while she was in prison, states she was unable to attend their funerals. Patient states she has been out of prison for 2 years.  She reports history of 9 felony charges.  She states she has an upcoming court date in February 2026 related to a suspended license.  She reports home medication of Paxil but states she has not taken this for about 1 week.  She also reports home medication of prazosin, provider discussed with patient this medication will be held at this  time due to patient's lower blood pressure.  Patient states her blood pressure tends to run low.  She denies dizziness, lightheadedness, fatigue, or vision changes.  Past Psychiatric History: see h&P Family History: History reviewed. No pertinent family history. Social History:  Social History   Substance and  Sexual Activity  Alcohol Use No     Social History   Substance and Sexual Activity  Drug Use Yes   Types: Marijuana, Cocaine   Comment: Heroin last use 07/22/2019, marijuana last use 07/22/2019, meth last use 2 weeks ago    Social History   Socioeconomic History   Marital status: Married    Spouse name: Not on file   Number of children: 4   Years of education: Not on file   Highest education level: Not on file  Occupational History   Not on file  Tobacco Use   Smoking status: Every Day    Current packs/day: 1.50    Average packs/day: 1.5 packs/day for 5.0 years (7.5 ttl pk-yrs)    Types: Cigarettes   Smokeless tobacco: Never  Vaping Use   Vaping status: Never Used  Substance and Sexual Activity   Alcohol use: No   Drug use: Yes    Types: Marijuana, Cocaine    Comment: Heroin last use 07/22/2019, marijuana last use 07/22/2019, meth last use 2 weeks ago   Sexual activity: Not Currently    Birth control/protection: Surgical  Other Topics Concern   Not on file  Social History Narrative   Not on file   Social Drivers of Health   Financial Resource Strain: Low Risk (07/07/2022)   Received from [facility]   Overall Financial Resource Strain (CARDIA)    Difficulty of Paying Living Expenses: Not hard at all  Food Insecurity: No Food Insecurity (01/28/2024)   Hunger Vital Sign    Worried About Running Out of Food in the Last Year: Never true    Ran Out of Food in the Last Year: Never true  Transportation Needs: No Transportation Needs (01/28/2024)   PRAPARE - Administrator, Civil Service (Medical): No    Lack of Transportation (Non-Medical): No  Physical Activity: Not on file  Stress: Not on file  Social Connections: Moderately Integrated (01/28/2024)   Social Connection and Isolation Panel    Frequency of Communication with Friends and Family: Twice a week    Frequency of Social Gatherings with Friends and Family: Twice a week    Attends Religious  Services: 1 to 4 times per year    Active Member of Clubs or Organizations: Yes    Attends Engineer, Structural: More than 4 times per year    Marital Status: Never married   Past Medical History:  Past Medical History:  Diagnosis Date   Anemia    Anxiety and depression    Asthma    Bipolar 1 disorder (HCC)    Drug use    Herpes genitalis 07/20/2014   Has been tx'd since 32weeks for suppresion   Hypotension    Marijuana use    Schizophrenia (HCC) 2022   diagnosed while in prision in 2022   Seizures (HCC)    During pregnancy; most recent April 2016    Past Surgical History:  Procedure Laterality Date   sonohystogram  2012   TUBAL LIGATION N/A 02/29/2016   Procedure: POST PARTUM TUBAL LIGATION;  Surgeon: Garnette JONETTA Mace, MD;  Location: [facility] ORS;  Service: Gynecology;  Laterality: N/A;   WISDOM TOOTH  EXTRACTION Bilateral 2011   Post op infection    Current Medications: Current Facility-Administered Medications  Medication Dose Route Frequency Provider Last Rate Last Admin   acetaminophen  (TYLENOL ) tablet 650 mg  650 mg Oral Q6H PRN Smith, Annie B, NP   650 mg at 01/29/24 1127   alum & mag hydroxide-simeth (MAALOX/MYLANTA) 200-200-20 MG/5ML suspension 30 mL  30 mL Oral Q4H PRN Smith, Annie B, NP       buprenorphine-naloxone  (SUBOXONE) 8-2 mg per SL tablet 1 tablet  1 tablet Sublingual TID Smith, Annie B, NP   1 tablet at 01/29/24 1700   haloperidol (HALDOL) tablet 5 mg  5 mg Oral TID PRN Smith, Annie B, NP       And   diphenhydrAMINE  (BENADRYL ) capsule 50 mg  50 mg Oral TID PRN Smith, Annie B, NP       haloperidol lactate (HALDOL) injection 5 mg  5 mg Intramuscular TID PRN Smith, Annie B, NP       And   diphenhydrAMINE  (BENADRYL ) injection 50 mg  50 mg Intramuscular TID PRN Smith, Annie B, NP       And   LORazepam (ATIVAN) injection 2 mg  2 mg Intramuscular TID PRN Smith, Annie B, NP       haloperidol lactate (HALDOL) injection 10 mg  10 mg Intramuscular TID PRN  Smith, Annie B, NP   10 mg at 01/30/24 0018   And   diphenhydrAMINE  (BENADRYL ) injection 50 mg  50 mg Intramuscular TID PRN Smith, Annie B, NP   50 mg at 01/30/24 0019   And   LORazepam (ATIVAN) injection 2 mg  2 mg Intramuscular TID PRN Smith, Annie B, NP   2 mg at 01/30/24 0018   hydrOXYzine (ATARAX) tablet 25 mg  25 mg Oral TID PRN Smith, Annie B, NP   25 mg at 01/28/24 2112   magnesium hydroxide (MILK OF MAGNESIA) suspension 30 mL  30 mL Oral Daily PRN Smith, Annie B, NP       nicotine (NICODERM CQ - dosed in mg/24 hours) patch 21 mg  21 mg Transdermal Daily Jadapalle, Sree, MD   21 mg at 01/30/24 1211   nicotine polacrilex (NICORETTE) gum 2 mg  2 mg Oral PRN Jadapalle, Sree, MD   2 mg at 01/28/24 1241   OLANZapine zydis (ZYPREXA) disintegrating tablet 5 mg  5 mg Oral QHS Raynell Upton, NP   5 mg at 01/29/24 2112   traZODone (DESYREL) tablet 50 mg  50 mg Oral QHS PRN Smith, Annie B, NP        Lab Results:  Results for orders placed or performed during the hospital encounter of 01/27/24 (from the past 48 hours)  Hemoglobin A1c     Status: None   Collection Time: 01/28/24  2:30 PM  Result Value Ref Range   Hgb A1c MFr Bld 4.8 4.8 - 5.6 %    Comment: (NOTE) Diagnosis of Diabetes The following HbA1c ranges recommended by the American Diabetes Association (ADA) Dontai Pember be used as an aid in the diagnosis of diabetes mellitus.  Hemoglobin             Suggested A1C NGSP%              Diagnosis  <5.7                   Non Diabetic  5.7-6.4                Pre-Diabetic  >  6.4                   Diabetic  <7.0                   Glycemic control for                       adults with diabetes.     Mean Plasma Glucose 91.06 mg/dL    Comment: Performed at [facility], [address], [place], [place] [address]  Lipid panel     Status: Abnormal   Collection Time: 01/28/24  2:30 PM  Result Value Ref Range   Cholesterol 131 0 - 200 mg/dL    Comment:        ATP III CLASSIFICATION:  <200      mg/dL   Desirable  799-760  mg/dL   Borderline High  >=759    mg/dL   High           Triglycerides 52 <150 mg/dL   HDL 36 (L) >59 mg/dL   Total CHOL/HDL Ratio 3.6 RATIO   VLDL 10 0 - 40 mg/dL   LDL Cholesterol 84 0 - 99 mg/dL    Comment:        Total Cholesterol/HDL:CHD Risk Coronary Heart Disease Risk Table                     Men   Women  1/2 Average Risk   3.4   3.3  Average Risk       5.0   4.4  2 X Average Risk   9.6   7.1  3 X Average Risk  23.4   11.0        Use the calculated Patient Ratio above and the CHD Risk Table to determine the patient's CHD Risk.        ATP III CLASSIFICATION (LDL):  <100     mg/dL   Optimal  899-870  mg/dL   Near or Above                    Optimal  130-159  mg/dL   Borderline  839-810  mg/dL   High  >809     mg/dL   Very High Performed at [facility], [address] [place], [place] [address]   TSH     Status: None   Collection Time: 01/28/24  2:30 PM  Result Value Ref Range   TSH 1.580 0.350 - 4.500 uIU/mL    Comment: Performed at [facility], [address] [place], [place] [address]  Troponin T, High Sensitivity     Status: None   Collection Time: 01/29/24  1:22 PM  Result Value Ref Range   Troponin T High Sensitivity <15 0 - 19 ng/L    Comment: (NOTE) Biotin concentrations > 1000 ng/mL falsely decrease TnT results.  Serial cardiac troponin measurements are suggested.  Refer to the Links section for chest pain algorithms and additional  guidance. Performed at [facility], [address] [place], [place] [address]   Comprehensive metabolic panel     Status: Abnormal   Collection Time: 01/29/24  1:22 PM  Result Value Ref Range   Sodium 135 135 - 145 mmol/L   Potassium 3.8 3.5 - 5.1 mmol/L   Chloride 103 98 - 111 mmol/L   CO2 25 22 - 32 mmol/L   Glucose, Bld 121 (H) 70 -  99 mg/dL    Comment: Glucose reference range applies only to samples taken after fasting for at least 8 hours.   BUN  8 6 - 20 mg/dL   Creatinine, Ser 9.47 0.44 - 1.00 mg/dL   Calcium 8.7 (L) 8.9 - 10.3 mg/dL   Total Protein 6.5 6.5 - 8.1 g/dL   Albumin 3.9 3.5 - 5.0 g/dL   AST 11 (L) 15 - 41 U/L   ALT 6 0 - 44 U/L   Alkaline Phosphatase 58 38 - 126 U/L   Total Bilirubin 0.2 0.0 - 1.2 mg/dL   GFR, Estimated >39 >39 mL/min    Comment: (NOTE) Calculated using the CKD-EPI Creatinine Equation (2021)    Anion gap 7 5 - 15    Comment: Performed at [facility], [address] [place], [place] [address]  Troponin T, High Sensitivity     Status: None   Collection Time: 01/29/24  2:25 PM  Result Value Ref Range   Troponin T High Sensitivity <15 0 - 19 ng/L    Comment: (NOTE) Biotin concentrations > 1000 ng/mL falsely decrease TnT results.  Serial cardiac troponin measurements are suggested.  Refer to the Links section for chest pain algorithms and additional  guidance. Performed at [facility], [address] [place], [place] [address]     Blood Alcohol level:  Lab Results  Component Value Date   [facility] <15 01/26/2024   ETH <10 12/07/2021    Metabolic Disorder Labs: Lab Results  Component Value Date   HGBA1C 4.8 01/28/2024   MPG 91.06 01/28/2024   No results found for: PROLACTIN Lab Results  Component Value Date   CHOL 131 01/28/2024   TRIG 52 01/28/2024   HDL 36 (L) 01/28/2024   CHOLHDL 3.6 01/28/2024   VLDL 10 01/28/2024   LDLCALC 84 01/28/2024    Physical Findings: AIMS:  , ,  ,  ,    CIWA:    COWS:      Psychiatric Specialty Exam:  Presentation  General Appearance:  Disheveled  Eye Contact: Poor  Speech: Garbled  Speech Volume: Normal    Mood and Affect  Mood: -- (Tired)  Affect: Appropriate   Thought Process  Thought Processes: Linear  Orientation:Full (Time, Place and Person)  Thought Content:WDL  Hallucinations:Hallucinations: None  Ideas of Reference:None  Suicidal Thoughts:Suicidal Thoughts: No  Homicidal  Thoughts:Homicidal Thoughts: No   Sensorium  Memory: Recent Fair; Immediate Fair  Judgment: Poor  Insight: Poor   Executive Functions  Concentration: Fair  Attention Span: Fair  Recall: [employer] of Knowledge: Fair  Language: Fair   Psychomotor Activity  Psychomotor Activity: Psychomotor Activity: Normal  Musculoskeletal: Strength & Muscle Tone: within normal limits Gait & Station: normal Assets  Assets: Manufacturing Systems Engineer; Housing    Physical Exam: Physical Exam Vitals and nursing note reviewed.  Constitutional:      Appearance: Normal appearance.  Cardiovascular:     Rate and Rhythm: Tachycardia present.  Pulmonary:     Effort: Pulmonary effort is normal.  Neurological:     Mental Status: She is alert and oriented to person, place, and time.    Review of Systems  Eyes:  Negative for blurred vision.  Respiratory:  Negative for shortness of breath.   Cardiovascular:  Negative for chest pain.  Gastrointestinal:  Negative for diarrhea, nausea and vomiting.  Neurological:  Negative for dizziness.  Psychiatric/Behavioral:  Positive for substance abuse. Negative for hallucinations and suicidal ideas. The patient is not  nervous/anxious.   All other systems reviewed and are negative.  Blood pressure 91/66, pulse (!) 118, temperature 98.5 F (36.9 C), temperature source Oral, resp. rate 16, height 5' 6 (1.676 m), weight 62.1 kg, SpO2 95%, unknown if currently breastfeeding. Body mass index is 22.11 kg/m.  Diagnosis: Principal Problem:   Substance-induced psychotic disorder (HCC) Active Problems:   Anxiety   Depression   Hypotension   Chest pain   PLAN: Safety and Monitoring:  -- Involuntary admission to inpatient psychiatric unit for safety, stabilization and treatment  -- Daily contact with patient to assess and evaluate symptoms and progress in treatment  -- Patient's case to be discussed in multi-disciplinary team meeting  --  Observation Level : q15 minute checks  -- Vital signs:  q12 hours  -- Precautions: suicide, elopement, and assault -- Encouraged patient to participate in unit milieu and in scheduled group therapies   2. Psychiatric Treatment:  Scheduled Medications: Continue Zyprexa 5 mg nightly (dose lowered due to reported sedation/fogginess) Suboxone TID Nicotine replacement Vitals Q2 hours due to recent IM medication and somnolence (can be discontinued when patient awake and active)   -- The risks/benefits/side-effects/alternatives to this medication were discussed in detail with the patient and time was given for questions. The patient consents to medication trial.  3. Medical Issues Being Addressed:  Hospitalist consulted for chest pain/low BP/elevated HR (01/29/2024).   As per Hospitalist: Chest x-ray was negative for acute cardiopulmonary process, EKG showed sinus rhythm, troponin was negative.  CMP was unremarkable.   I suspect patient's chest pain is related to anxiety with possibility of withdrawal from methamphetamine and psychosomatic disturbances.   4. Discharge Planning:   -- Social work and case management to assist with discharge planning and identification of hospital follow-up needs prior to discharge  -- Estimated LOS: 5-7 days  Miquel Lamson, NP 01/30/2024, 12:26 PM

## 2024-01-30 NOTE — Progress Notes (Signed)
 Patient is more easily aroused compared to earlier in the day. She presented to the dining room for dinner. While she did say that she still feels sleepy, she was able to ambulate without assistance and denies feeling lightheaded.

## 2024-01-30 NOTE — Progress Notes (Signed)
 Pt was somnolent for much of the day today, and suboxone was held throughout the day.  Pt was responsive when awakened, but lethargic.  Pt was encouraged to eat and drink fluids.  Pt was observed in the dining room eating some of her lunch meal, but she was in bed and sleeping much of the afternoon.  Pt was up to the dayroom late in the shift and was provided more Gatorade and instructed to continue drinking fluids.  Pt continued to appear drowsy even when sitting in the dayroom.  Vital signs are ordered q4h at this time.    01/30/24 1400  Psych Admission Type (Psych Patients Only)  Admission Status Involuntary  Psychosocial Assessment  Patient Complaints Anxiety  Eye Contact Brief  Facial Expression Anxious  Affect Anxious  Speech Logical/coherent  Interaction Isolative  Motor Activity Slow;Lethargic  Appearance/Hygiene Disheveled  Behavior Characteristics Unable to participate;Other (Comment) (somnolent)  Mood Apathetic  Thought Process  Coherency WDL  Content UTA  Delusions None reported or observed  Perception WDL  Hallucination None reported or observed  Judgment Poor  Confusion None  Danger to Self  Current suicidal ideation? Denies  Danger to Others  Danger to Others None reported or observed

## 2024-01-30 NOTE — Group Note (Signed)
 Date:  01/30/2024 Time:  9:03 PM  Group Topic/Focus:  Wrap-Up Group:   The focus of this group is to help patients review their daily goal of treatment and discuss progress on daily workbooks.    Participation Level:  Did Not Attend  Participation Quality:  none  Affect:  none  Cognitive:  none  Insight: None  Engagement in Group:  none  Modes of Intervention:  none  Additional Comments:  none   Butler LITTIE Gelineau 01/30/2024, 9:03 PM

## 2024-01-30 NOTE — Progress Notes (Signed)
 Patient appear paranoid and restless, she was pacing and experiencing AVH, she stated to writer there  there is someone in my room and they are pulling my legs upon assessment of the room there was no one in the room she was hysterical, crying, anxious that and expressed impending doom and eminent danger. Patient thoughts are disorganized , speech is pressured and tangential, poor eye contact, she appeared frightened and anxious  affect was congruent with mood. Writer immediately reassured  patient of her safety and she was medicated with agitation protocol. Patient because of paranoia brought her bed on the hall way. 15 minutes safety checks maintained will continue to monitor.

## 2024-01-30 NOTE — Plan of Care (Signed)

## 2024-01-30 NOTE — Group Note (Signed)
 Date:  01/30/2024 Time:  5:16 PM  Group Topic/Focus:  Goals Group:   The focus of this group is to help patients establish daily goals to achieve during treatment and discuss how the patient can incorporate goal setting into their daily lives to aide in recovery.    Participation Level:  Did Not Attend  Participation Quality:    Affect:    Cognitive:    Insight:   Engagement in Group:    Modes of Intervention:    Additional Comments:    Bonnielee LOISE Pepper 01/30/2024, 5:16 PM

## 2024-01-31 MED ORDER — RISPERIDONE 1 MG PO TABS
3.0000 mg | ORAL_TABLET | Freq: Every day | ORAL | Status: DC
  Administered 2024-01-31 – 2024-02-01 (×2): 3 mg via ORAL
  Filled 2024-01-31 (×2): qty 3

## 2024-01-31 MED ORDER — RISPERIDONE 1 MG PO TABS: 3.0000 mg | ORAL_TABLET | Freq: Every day | ORAL | Status: DC

## 2024-01-31 MED ORDER — RISPERIDONE 1 MG PO TABS: 2.0000 mg | ORAL_TABLET | Freq: Every day | ORAL | Status: DC

## 2024-01-31 NOTE — Progress Notes (Signed)
   01/31/24 1200  Psych Admission Type (Psych Patients Only)  Admission Status Involuntary  Psychosocial Assessment  Patient Complaints Sleep disturbance  Eye Contact Brief  Facial Expression Flat  Affect Anxious  Speech Logical/coherent  Interaction Avoidant  Motor Activity Slow  Appearance/Hygiene Unremarkable  Behavior Characteristics Unwilling to participate (Patient too sleepy.)  Mood Labile  Thought Process  Coherency WDL  Content UTA  Delusions None reported or observed  Perception WDL  Hallucination None reported or observed  Judgment Impaired  Confusion None  Danger to Self  Current suicidal ideation? Denies  Danger to Others  Danger to Others None reported or observed   Patient stayed in bed most of the shift. When staff encouraged for groups patient states  I don't need any group therapy. I just need to go home. Patient appears irritable at times. Compliant with medications. Support and encouragement given.

## 2024-01-31 NOTE — Group Note (Signed)
 Recreation Therapy Group Note   Group Topic:Other  Group Date: 01/31/2024 Start Time: 1500 End Time: 1545 Facilitators: Celestia Jeoffrey FORBES ARTICE, CTRS Location: Craft Room  Activity Description/Intervention: Therapeutic Drumming. Patients with peers and staff were given the opportunity to engage in a leader facilitated [facility] Group Empowerment Drumming Circle with staff from the [employer], in partnership with [employer]. Teaching laboratory technician and trained [employer], Norleen Mon leading with LRT observing and documenting intervention and pt response. This evidenced-based practice targets 7 areas of health and wellbeing in the human experience including: stress-reduction, exercise, self-expression, camaraderie/support, nurturing, spirituality, and music-making (leisure).    Goal Area(s) Addresses:  Patient will engage in pro-social way in music group.  Patient will follow directions of drum leader on the first prompt. Patient will demonstrate no behavioral issues during group.  Patient will identify if a reduction in stress level occurs as a result of participation in therapeutic drum circle.   Affect/Mood: N/A   Participation Level: Did not attend    Clinical Observations/Individualized Feedback: Patient did not attend group.  Plan: Continue to engage patient in RT group sessions 2-3x/week.   Jeoffrey FORBES Celestia, LRT, CTRS 01/31/2024 5:33 PM

## 2024-01-31 NOTE — Progress Notes (Signed)
 [facility] MD Progress Note  01/31/2024 3:46 PM Bailey Cabrera  MRN:  [id number]   Subjective:  Chart reviewed, case discussed in multidisciplinary meeting, patient seen during rounds.   11/17: On assessment patient is found in bed they are tired and somnolent.  They deny SI, HI, and AVH.  They do not appear to be responding to internal stimuli.  They are irritable indicating do not want to talk and that they want to be discharged.  Will transition patient back to home Risperdal.  Given intermittent compliance we will continue to hold Paxil and given blood pressures will continue to hold prazosin and they are not agreeable to further adjustments at this time.  Zyprexa was discontinued in favor of Risperdal.  Patient is encouraged to hydrate reviewed tachycardia with them also reviewed blood pressure.   11/16: Upon assessment, patient presents as tired/somnolent. She reports they gave me a shot. Patient denies SI, HI, and AVH. She does not appear to be responding to external stimuli nor is there evidence of paranoia/delusions at this time. Patient does not want to talk further and wants to sleep. Encouraged fluids and intake of food.   Per nursing report, overnight patient presented paranoid and was experiencing AVH. She brought her bed into the hallway due to anxiety/fear of hallucinations (see nursing note). Due to acute distress and anxiety, patient was medication utilizing agitation protocol. She was given 10 mg Haldol, 2 mg Ativan, and 50 mg Benadryl  IM (0018 am). This was a few hours after receiving nightly PO Zyprexa 5 mg.   Due to current level of somnolence and patient likely metabolizing IM medications, Suboxone dosing held to avoid respiratory depression. Can resume once patient more awake/less somnolence. At request, nursing updated vitals. Vitals remain consistent with previous values. Vitals Q2 hours for remainder of day until patient more awake.   11/15: Upon assessment today patient  is found on the phone. Patient is able to engage with clinical research associate and expresses desire to go home. Patient reports her children who reside in [place]  are coming in tomorrow and she does not get to see them often. She then reports they are leaving Monday night. She reports that she lives with her pawpaw and he has dementia. He has dementia and I have schizophrenia, so we both see things. She then reports that they exacerbate each other seeing things. She denies AVH since being in the hospital. Patient minimizing substance use - when asked directly about methamphetamines, she reports she began using because of trauma. She reports she had sobriety for a period of time until her and her husband split up. Patient reporting she was told her stay would only be 3-4 days by the police that brought her to the hospital. Patient also reporting increased sedation with night medication. Agreed to decrease dosing at this time to see if improvement with sedation. Patient denies SI, HI, and AVH.   Of note, patient's BP has been running low. Today it was low, with elevated HR, and she began reporting right chest pain. Hospitalist consulted. No significant findings.   From Admission: On interview today, patient endorses methamphetamine use approximately 5 days ago and cocaine use over 1 week ago.  She reported visual hallucinations upon ED presentation but now denies hallucinations.  She denies SI/HI/plan.  She denies previous suicide attempts or self-harm.  She reports decreased motivation and decreased energy, as well as depressed mood.  She denies sleep disturbance or appetite changes.  She endorses anxiety, stating she worries about  many different things and has difficulty relaxing.  She reports a history of trauma related to the loss of her siblings and her father while she was in prison, states she was unable to attend their funerals. Patient states she has been out of prison for 2 years.  She reports history of 9 felony  charges.  She states she has an upcoming court date in February 2026 related to a suspended license.  She reports home medication of Paxil but states she has not taken this for about 1 week.  She also reports home medication of prazosin, provider discussed with patient this medication will be held at this time due to patient's lower blood pressure.  Patient states her blood pressure tends to run low.  She denies dizziness, lightheadedness, fatigue, or vision changes.  Past Psychiatric History: see h&P Family History: History reviewed. No pertinent family history. Social History:  Social History   Substance and Sexual Activity  Alcohol Use No     Social History   Substance and Sexual Activity  Drug Use Yes   Types: Marijuana, Cocaine   Comment: Heroin last use 07/22/2019, marijuana last use 07/22/2019, meth last use 2 weeks ago    Social History   Socioeconomic History   Marital status: Married    Spouse name: Not on file   Number of children: 4   Years of education: Not on file   Highest education level: Not on file  Occupational History   Not on file  Tobacco Use   Smoking status: Every Day    Current packs/day: 1.50    Average packs/day: 1.5 packs/day for 5.0 years (7.5 ttl pk-yrs)    Types: Cigarettes   Smokeless tobacco: Never  Vaping Use   Vaping status: Never Used  Substance and Sexual Activity   Alcohol use: No   Drug use: Yes    Types: Marijuana, Cocaine    Comment: Heroin last use 07/22/2019, marijuana last use 07/22/2019, meth last use 2 weeks ago   Sexual activity: Not Currently    Birth control/protection: Surgical  Other Topics Concern   Not on file  Social History Narrative   Not on file   Social Drivers of Health   Financial Resource Strain: Low Risk (07/07/2022)   [facility] from [facility]   Overall Financial Resource Strain (CARDIA)    Difficulty of Paying Living Expenses: Not hard at all  Food Insecurity: No Food Insecurity (01/28/2024)    Hunger Vital Sign    Worried About Running Out of Food in the Last Year: Never true    Ran Out of Food in the Last Year: Never true  Transportation Needs: No Transportation Needs (01/28/2024)   PRAPARE - Administrator, Civil Service (Medical): No    Lack of Transportation (Non-Medical): No  Physical Activity: Not on file  Stress: Not on file  Social Connections: Moderately Integrated (01/28/2024)   Social Connection and Isolation Panel    Frequency of Communication with Friends and Family: Twice a week    Frequency of Social Gatherings with Friends and Family: Twice a week    Attends Religious Services: 1 to 4 times per year    Active Member of [employer] or Organizations: Yes    Attends Engineer, Structural: More than 4 times per year    Marital Status: Never married   Past Medical History:  Past Medical History:  Diagnosis Date   Anemia    Anxiety and depression    Asthma  Bipolar 1 disorder (HCC)    Drug use    Herpes genitalis 07/20/2014   Has been tx'd since 32weeks for suppresion   Hypotension    Marijuana use    Schizophrenia (HCC) 2022   diagnosed while in prision in 2022   Seizures (HCC)    During pregnancy; most recent April 2016    Past Surgical History:  Procedure Laterality Date   sonohystogram  2012   TUBAL LIGATION N/A 02/29/2016   Procedure: POST PARTUM TUBAL LIGATION;  Surgeon: Garnette JONETTA Mace, MD;  Location: [facility] ORS;  Service: Gynecology;  Laterality: N/A;   WISDOM TOOTH EXTRACTION Bilateral 2011   Post op infection    Current Medications: Current Facility-Administered Medications  Medication Dose Route Frequency Provider Last Rate Last Admin   acetaminophen  (TYLENOL ) tablet 650 mg  650 mg Oral Q6H PRN Smith, Annie B, NP   650 mg at 01/29/24 1127   alum & mag hydroxide-simeth (MAALOX/MYLANTA) 200-200-20 MG/5ML suspension 30 mL  30 mL Oral Q4H PRN Smith, Annie B, NP       buprenorphine-naloxone  (SUBOXONE) 8-2 mg per SL tablet 1  tablet  1 tablet Sublingual TID Smith, Annie B, NP   1 tablet at 01/31/24 1155   haloperidol (HALDOL) tablet 5 mg  5 mg Oral TID PRN Smith, Annie B, NP       And   diphenhydrAMINE  (BENADRYL ) capsule 50 mg  50 mg Oral TID PRN Smith, Annie B, NP       haloperidol lactate (HALDOL) injection 5 mg  5 mg Intramuscular TID PRN Smith, Annie B, NP       And   diphenhydrAMINE  (BENADRYL ) injection 50 mg  50 mg Intramuscular TID PRN Smith, Annie B, NP       And   LORazepam (ATIVAN) injection 2 mg  2 mg Intramuscular TID PRN Smith, Annie B, NP       haloperidol lactate (HALDOL) injection 10 mg  10 mg Intramuscular TID PRN Smith, Annie B, NP   10 mg at 01/30/24 0018   And   diphenhydrAMINE  (BENADRYL ) injection 50 mg  50 mg Intramuscular TID PRN Smith, Annie B, NP   50 mg at 01/30/24 0019   And   LORazepam (ATIVAN) injection 2 mg  2 mg Intramuscular TID PRN Smith, Annie B, NP   2 mg at 01/30/24 0018   hydrOXYzine (ATARAX) tablet 25 mg  25 mg Oral TID PRN Smith, Annie B, NP   25 mg at 01/28/24 2112   magnesium hydroxide (MILK OF MAGNESIA) suspension 30 mL  30 mL Oral Daily PRN Smith, Annie B, NP       nicotine (NICODERM CQ - dosed in mg/24 hours) patch 21 mg  21 mg Transdermal Daily Jadapalle, Sree, MD   21 mg at 01/31/24 1155   nicotine polacrilex (NICORETTE) gum 2 mg  2 mg Oral PRN Jadapalle, Sree, MD   2 mg at 01/28/24 1241   risperiDONE (RISPERDAL) tablet 3 mg  3 mg Oral QHS Margreat Widener E, PA-C       traZODone (DESYREL) tablet 50 mg  50 mg Oral QHS PRN Smith, Annie B, NP        Lab Results:  No results found for this or any previous visit (from the past 48 hours).   Blood Alcohol level:  Lab Results  Component Value Date   [facility] <15 01/26/2024   ETH <10 12/07/2021    Metabolic Disorder Labs: Lab Results  Component Value Date  HGBA1C 4.8 01/28/2024   MPG 91.06 01/28/2024   No results found for: PROLACTIN Lab Results  Component Value Date   CHOL 131 01/28/2024   TRIG 52  01/28/2024   HDL 36 (L) 01/28/2024   CHOLHDL 3.6 01/28/2024   VLDL 10 01/28/2024   LDLCALC 84 01/28/2024    Physical Findings: AIMS:  , ,  ,  ,    CIWA:    COWS:      Psychiatric Specialty Exam:  Presentation  General Appearance:  Disheveled  Eye Contact: Poor  Speech: Garbled  Speech Volume: Normal    Mood and Affect  Mood: -- (Tired)  Affect: Appropriate   Thought Process  Thought Processes: Linear  Orientation:Full (Time, Place and Person)  Thought Content:WDL  Hallucinations:Hallucinations: None  Ideas of Reference:None  Suicidal Thoughts:Suicidal Thoughts: No  Homicidal Thoughts:Homicidal Thoughts: No   Sensorium  Memory: Recent Fair; Immediate Fair  Judgment: Poor  Insight: Poor   Executive Functions  Concentration: Fair  Attention Span: Fair  Recall: Fair  Fund of Knowledge: Fair  Language: Fair   Psychomotor Activity  Psychomotor Activity: Psychomotor Activity: Normal  Musculoskeletal: Strength & Muscle Tone: within normal limits Gait & Station: normal Assets  Assets: Manufacturing Systems Engineer; Housing    Physical Exam: Physical Exam Vitals and nursing note reviewed.  Constitutional:      Appearance: Normal appearance.  Cardiovascular:     Rate and Rhythm: Tachycardia present.  Pulmonary:     Effort: Pulmonary effort is normal.  Neurological:     Mental Status: She is alert and oriented to person, place, and time.    Review of Systems  Eyes:  Negative for blurred vision.  Respiratory:  Negative for shortness of breath.   Cardiovascular:  Negative for chest pain.  Gastrointestinal:  Negative for diarrhea, nausea and vomiting.  Neurological:  Negative for dizziness.  Psychiatric/Behavioral:  Positive for substance abuse. Negative for hallucinations and suicidal ideas. The patient is not nervous/anxious.   All other systems reviewed and are negative.  Blood pressure 100/62, pulse (!) 110,  temperature 97.8 F (36.6 C), temperature source Oral, resp. rate 20, height 5' 6 (1.676 m), weight 62.1 kg, SpO2 99%, unknown if currently breastfeeding. Body mass index is 22.11 kg/m.  Diagnosis: Principal Problem:   Substance-induced psychotic disorder (HCC) Active Problems:   Anxiety   Depression   Hypotension   Chest pain   PLAN: Safety and Monitoring:  -- Involuntary admission to inpatient psychiatric unit for safety, stabilization and treatment  -- Daily contact with patient to assess and evaluate symptoms and progress in treatment  -- Patient's case to be discussed in multi-disciplinary team meeting  -- Observation Level : q15 minute checks  -- Vital signs:  q12 hours  -- Precautions: suicide, elopement, and assault -- Encouraged patient to participate in unit milieu and in scheduled group therapies   2. Psychiatric Treatment:  Scheduled Medications: Discontinue Zyprexa restart risperidone 3 mg nightly this is their home medication. Continue Suboxone TID Nicotine replacement Vitals Q2 hours due to recent IM medication and somnolence (can be discontinued when patient awake and active) Patient not agreeable to further medication updates.  Prazosin and Paxil were not restarted as discussed above.   -- The risks/benefits/side-effects/alternatives to this medication were discussed in detail with the patient and time was given for questions. The patient consents to medication trial.  3. Medical Issues Being Addressed:  Hospitalist consulted for chest pain/low BP/elevated HR (01/29/2024).   As per Hospitalist: Chest x-ray was  negative for acute cardiopulmonary process, EKG showed sinus rhythm, troponin was negative.  CMP was unremarkable.   I suspect patient's chest pain is related to anxiety with possibility of withdrawal from methamphetamine and psychosomatic disturbances.   4. Discharge Planning:   -- Social work and case management to assist with discharge planning and  identification of hospital follow-up needs prior to discharge  -- Estimated LOS: 5-7 days  Donnice FORBES Right, PA-C 01/31/2024, 3:46 PM

## 2024-01-31 NOTE — Group Note (Signed)
 Recreation Therapy Group Note   Group Topic:Health and Wellness  Group Date: 01/31/2024 Start Time: 1015 End Time: 1100 Facilitators: Celestia Jeoffrey BRAVO, LRT, CTRS Location: Courtyard  Group Description: [employer]. LRT and patients played games of basketball, drew with chalk, and played corn hole while outside in the courtyard while getting fresh air and sunlight. Music was being played in the background. LRT and peers conversed about different games they have played before, what they do in their free time and anything else that is on their minds. LRT encouraged pts to drink water after being outside, sweating and getting their heart rate up.  Goal Area(s) Addressed: Patient will build on frustration tolerance skills. Patients will partake in a competitive play game with peers. Patients will gain knowledge of new leisure interest/hobby.    Affect/Mood: N/A   Participation Level: Did not attend    Clinical Observations/Individualized Feedback: Patient did not attend group.   Plan: Continue to engage patient in RT group sessions 2-3x/week.   Jeoffrey BRAVO Celestia, LRT, CTRS 01/31/2024 11:14 AM

## 2024-01-31 NOTE — Group Note (Signed)
 Date:  01/31/2024 Time:  9:02 PM  Group Topic/Focus:  Managing Feelings:   The focus of this group is to identify what feelings patients have difficulty handling and develop a plan to handle them in a healthier way upon discharge. Wrap-Up Group:   The focus of this group is to help patients review their daily goal of treatment and discuss progress on daily workbooks.    Participation Level:  Active  Participation Quality:  Drowsy  Affect:  Appropriate  Cognitive:  Appropriate and Lacking  Insight: Appropriate and Limited  Engagement in Group:  Limited  Modes of Intervention:  Discussion  Additional Comments:     Arlester CHRISTELLA Servant 01/31/2024, 9:02 PM

## 2024-01-31 NOTE — Plan of Care (Signed)
   Problem: Education: Goal: Emotional status will improve Outcome: Progressing   Problem: Education: Goal: Mental status will improve Outcome: Progressing

## 2024-01-31 NOTE — Group Note (Signed)
 LCSW Group Therapy Note   Group Date: 01/31/2024 Start Time: 1300 End Time: 1400   Type of Therapy and Topic:  Group Therapy: Challenging Core Beliefs  Participation Level:  Did Not Attend  Description of Group:  Patients were educated about core beliefs and asked to identify one harmful core belief that they have. Patients were asked to explore from where those beliefs originate. Patients were asked to discuss how those beliefs make them feel and the resulting behaviors of those beliefs. They were then be asked if those beliefs are true and, if so, what evidence they have to support them. Lastly, group members were challenged to replace those negative core beliefs with helpful beliefs.   Therapeutic Goals:   1. Patient will identify harmful core beliefs and explore the origins of such beliefs. 2. Patient will identify feelings and behaviors that result from those core beliefs. 3. Patient will discuss whether such beliefs are true. 4.  Patient will replace harmful core beliefs with helpful ones.  Summary of Patient Progress: Patient did not attend.   Therapeutic Modalities: Cognitive Behavioral Therapy; Solution-Focused Therapy   Shahzaib Azevedo M Jareb Radoncic, [place] 01/31/2024  2:29 PM

## 2024-01-31 NOTE — Plan of Care (Signed)
 Problem: Coping: Goal: Ability to verbalize frustrations and anger appropriately will improve Outcome: Progressing   Problem: Physical Regulation: Goal: Ability to maintain clinical measurements within normal limits will improve Outcome: Progressing   Problem: Safety: Goal: Periods of time without injury will increase Outcome: Progressing

## 2024-01-31 NOTE — Group Note (Signed)
 Date:  01/31/2024 Time:  7:12 PM  Group Topic/Focus:  Dimensions of Wellness:   The focus of this group is to introduce the topic of wellness and discuss the role each dimension of wellness plays in total health.    Participation Level:  Did Not Attend   Camellia HERO Althia Egolf 01/31/2024, 7:12 PM

## 2024-02-01 ENCOUNTER — Encounter: Payer: Self-pay | Admitting: Psychiatry

## 2024-02-01 DIAGNOSIS — F419 Anxiety disorder, unspecified: Secondary | ICD-10-CM

## 2024-02-01 DIAGNOSIS — F32A Depression, unspecified: Secondary | ICD-10-CM | POA: Diagnosis not present

## 2024-02-01 DIAGNOSIS — R079 Chest pain, unspecified: Secondary | ICD-10-CM | POA: Diagnosis not present

## 2024-02-01 DIAGNOSIS — F19959 Other psychoactive substance use, unspecified with psychoactive substance-induced psychotic disorder, unspecified: Secondary | ICD-10-CM | POA: Diagnosis not present

## 2024-02-01 DIAGNOSIS — I959 Hypotension, unspecified: Secondary | ICD-10-CM

## 2024-02-01 MED ORDER — BUPRENORPHINE HCL-NALOXONE HCL 2-0.5 MG SL SUBL
2.0000 | SUBLINGUAL_TABLET | Freq: Three times a day (TID) | SUBLINGUAL | Status: DC
  Administered 2024-02-02 – 2024-02-03 (×2): 2 via SUBLINGUAL
  Filled 2024-02-01 (×3): qty 2

## 2024-02-01 NOTE — Plan of Care (Signed)

## 2024-02-01 NOTE — Group Note (Signed)
 Date:  02/01/2024 Time:  10:30 AM  Group Topic/Focus:  Self Esteem Action Plan:   The focus of this group is to help patients create a plan to continue to build self-esteem after discharge.    Participation Level:  Active  Participation Quality:  Appropriate  Affect:  Appropriate  Cognitive:  Alert  Insight: Appropriate  Engagement in Group:  Engaged  Modes of Intervention:  Activity, Discussion, and Education  Additional Comments:    Bailey Cabrera Bennett 02/01/2024, 10:30 AM

## 2024-02-01 NOTE — Plan of Care (Signed)
   Problem: Education: Goal: Emotional status will improve Outcome: Progressing

## 2024-02-01 NOTE — Plan of Care (Signed)
  Problem: Education: Goal: Emotional status will improve Outcome: Progressing Goal: Mental status will improve Outcome: Not Met (add Reason) Goal: Verbalization of understanding the information provided will improve Outcome: Progressing   Problem: Activity: Goal: Interest or engagement in activities will improve Outcome: Progressing

## 2024-02-01 NOTE — Progress Notes (Signed)
   02/01/24 1000  Psych Admission Type (Psych Patients Only)  Admission Status Involuntary  Psychosocial Assessment  Patient Complaints Other (Comment) ( I feel very tired and im depressed because I miss my family)  Eye Contact Fair  Facial Expression Flat  Affect Anxious;Preoccupied  Speech Logical/coherent  Interaction Guarded  Motor Activity Slow  Appearance/Hygiene Unremarkable  Behavior Characteristics Calm  Mood Labile  Thought Process  Coherency WDL  Content Preoccupation  Delusions None reported or observed  Perception WDL  Hallucination None reported or observed  Judgment Impaired  Confusion None  Danger to Self  Current suicidal ideation? Denies

## 2024-02-01 NOTE — Progress Notes (Signed)
 [facility] MD Progress Note  02/01/2024 10:34 AM [facility]  MRN:  [id number]   Subjective:  Chart reviewed, case discussed in multidisciplinary meeting, patient seen during rounds.   11/18: On interview today, patient is noted to be alert and oriented.  Vital signs continue to be closely monitored.  Patient is noted to be drowsy on and off throughout the day, but is alert and engaged at time of interview.  She denies dizziness or lightheadedness. Suboxone held for first 2 out of 3 doses today.  Will decrease Suboxone dose.  Patient transitioned back to home Risperdal. Given intermittent compliance we will continue to hold Paxil and given blood pressures will continue to hold prazosin.  Patient states she is feeling well physically.  She continues to be future oriented and discharged focused.  She denies SI/HI/plan and denies hallucinations.  She denies current symptoms of depression or anxiety.  Per nursing report, she endorsed hallucinations last night.  She reports fair sleep and appetite.  Patient was evaluated by hospitalist on 11/15 for chest pain and hypotension.  Hospitalist suspects hypotension secondary to poor p.o. intake, recommended giving patient 20 ounce Gatorade bottles, 3 per day and encouraging water intake.  Hospitalist suspects chest pain related to anxiety with possibility of withdrawal from methamphetamine and psychosomatic disturbances.  Patient denies chest pain today.  11/17: On assessment patient is found in bed they are tired and somnolent.  They deny SI, HI, and AVH.  They do not appear to be responding to internal stimuli.  They are irritable indicating do not want to talk and that they want to be discharged.  Will transition patient back to home Risperdal.  Given intermittent compliance we will continue to hold Paxil and given blood pressures will continue to hold prazosin and they are not agreeable to further adjustments at this time.  Zyprexa was discontinued in favor of  Risperdal.  Patient is encouraged to hydrate reviewed tachycardia with them also reviewed blood pressure.   11/16: Upon assessment, patient presents as tired/somnolent. She reports they gave me a shot. Patient denies SI, HI, and AVH. She does not appear to be responding to external stimuli nor is there evidence of paranoia/delusions at this time. Patient does not want to talk further and wants to sleep. Encouraged fluids and intake of food.   Per nursing report, overnight patient presented paranoid and was experiencing AVH. She brought her bed into the hallway due to anxiety/fear of hallucinations (see nursing note). Due to acute distress and anxiety, patient was medication utilizing agitation protocol. She was given 10 mg Haldol, 2 mg Ativan, and 50 mg Benadryl  IM (0018 am). This was a few hours after receiving nightly PO Zyprexa 5 mg.   Due to current level of somnolence and patient likely metabolizing IM medications, Suboxone dosing held to avoid respiratory depression. Can resume once patient more awake/less somnolence. At request, nursing updated vitals. Vitals remain consistent with previous values. Vitals Q2 hours for remainder of day until patient more awake.   11/15: Upon assessment today patient is found on the phone. Patient is able to engage with clinical research associate and expresses desire to go home. Patient reports her children who reside in [place]  are coming in tomorrow and she does not get to see them often. She then reports they are leaving Monday night. She reports that she lives with her pawpaw and he has dementia. He has dementia and I have schizophrenia, so we both see things. She then reports that they exacerbate each  other seeing things. She denies AVH since being in the [facility]. Patient minimizing substance use - when asked directly about methamphetamines, she reports she began using because of trauma. She reports she had sobriety for a period of time until her and her husband split up.  Patient reporting she was told her stay would only be 3-4 days by the police that brought her to the [facility]. Patient also reporting increased sedation with night medication. Agreed to decrease dosing at this time to see if improvement with sedation. Patient denies SI, HI, and AVH.   Of note, patient's BP has been running low. Today it was low, with elevated HR, and she began reporting right chest pain. Hospitalist consulted. No significant findings.   From Admission: On interview today, patient endorses methamphetamine use approximately 5 days ago and cocaine use over 1 week ago.  She reported visual hallucinations upon ED presentation but now denies hallucinations.  She denies SI/HI/plan.  She denies previous suicide attempts or self-harm.  She reports decreased motivation and decreased energy, as well as depressed mood.  She denies sleep disturbance or appetite changes.  She endorses anxiety, stating she worries about many different things and has difficulty relaxing.  She reports a history of trauma related to the loss of her siblings and her father while she was in prison, states she was unable to attend their funerals. Patient states she has been out of prison for 2 years.  She reports history of 9 felony charges.  She states she has an upcoming court date in February 2026 related to a suspended license.  She reports home medication of Paxil but states she has not taken this for about 1 week.  She also reports home medication of prazosin, provider discussed with patient this medication will be held at this time due to patient's lower blood pressure.  Patient states her blood pressure tends to run low.  She denies dizziness, lightheadedness, fatigue, or vision changes.  Past Psychiatric History: see h&P Family History: History reviewed. No pertinent family history. Social History:  Social History   Substance and Sexual Activity  Alcohol Use No     Social History   Substance and Sexual  Activity  Drug Use Yes   Types: Marijuana, Cocaine   Comment: Heroin last use 07/22/2019, marijuana last use 07/22/2019, meth last use 2 weeks ago    Social History   Socioeconomic History   Marital status: Married    Spouse name: Not on file   Number of children: 4   Years of education: Not on file   Highest education level: Not on file  Occupational History   Not on file  Tobacco Use   Smoking status: Every Day    Current packs/day: 1.50    Average packs/day: 1.5 packs/day for 5.0 years (7.5 ttl pk-yrs)    Types: Cigarettes   Smokeless tobacco: Never  Vaping Use   Vaping status: Never Used  Substance and Sexual Activity   Alcohol use: No   Drug use: Yes    Types: Marijuana, Cocaine    Comment: Heroin last use 07/22/2019, marijuana last use 07/22/2019, meth last use 2 weeks ago   Sexual activity: Not Currently    Birth control/protection: Surgical  Other Topics Concern   Not on file  Social History Narrative   Not on file   Social Drivers of Health   Financial Resource Strain: Low Risk (07/07/2022)   [facility] from [facility]   Overall Financial Resource Strain (CARDIA)  Difficulty of Paying Living Expenses: Not hard at all  Food Insecurity: No Food Insecurity (01/28/2024)   Hunger Vital Sign    Worried About Running Out of Food in the Last Year: Never true    Ran Out of Food in the Last Year: Never true  Transportation Needs: No Transportation Needs (01/28/2024)   PRAPARE - Administrator, Civil Service (Medical): No    Lack of Transportation (Non-Medical): No  Physical Activity: Not on file  Stress: Not on file  Social Connections: Moderately Integrated (01/28/2024)   Social Connection and Isolation Panel    Frequency of Communication with Friends and Family: Twice a week    Frequency of Social Gatherings with Friends and Family: Twice a week    Attends Religious Services: 1 to 4 times per year    Active Member of Clubs or Organizations: Yes     Attends Engineer, Structural: More than 4 times per year    Marital Status: Never married   Past Medical History:  Past Medical History:  Diagnosis Date   Anemia    Anxiety and depression    Asthma    Bipolar 1 disorder (HCC)    Drug use    Herpes genitalis 07/20/2014   Has been tx'd since 32weeks for suppresion   Hypotension    Marijuana use    Schizophrenia (HCC) 2022   diagnosed while in prision in 2022   Seizures (HCC)    During pregnancy; most recent April 2016    Past Surgical History:  Procedure Laterality Date   sonohystogram  2012   TUBAL LIGATION N/A 02/29/2016   Procedure: POST PARTUM TUBAL LIGATION;  Surgeon: Garnette JONETTA Mace, MD;  Location: [facility] ORS;  Service: Gynecology;  Laterality: N/A;   WISDOM TOOTH EXTRACTION Bilateral 2011   Post op infection    Current Medications: Current Facility-Administered Medications  Medication Dose Route Frequency Provider Last Rate Last Admin   acetaminophen  (TYLENOL ) tablet 650 mg  650 mg Oral Q6H PRN Smith, Annie B, NP   650 mg at 01/29/24 1127   alum & mag hydroxide-simeth (MAALOX/MYLANTA) 200-200-20 MG/5ML suspension 30 mL  30 mL Oral Q4H PRN Smith, Annie B, NP       buprenorphine-naloxone  (SUBOXONE) 8-2 mg per SL tablet 1 tablet  1 tablet Sublingual TID Smith, Annie B, NP   1 tablet at 01/31/24 1705   haloperidol (HALDOL) tablet 5 mg  5 mg Oral TID PRN Smith, Annie B, NP       And   diphenhydrAMINE  (BENADRYL ) capsule 50 mg  50 mg Oral TID PRN Smith, Annie B, NP       haloperidol lactate (HALDOL) injection 5 mg  5 mg Intramuscular TID PRN Smith, Annie B, NP       And   diphenhydrAMINE  (BENADRYL ) injection 50 mg  50 mg Intramuscular TID PRN Smith, Annie B, NP       And   LORazepam (ATIVAN) injection 2 mg  2 mg Intramuscular TID PRN Smith, Annie B, NP       haloperidol lactate (HALDOL) injection 10 mg  10 mg Intramuscular TID PRN Smith, Annie B, NP   10 mg at 01/30/24 0018   And   diphenhydrAMINE  (BENADRYL )  injection 50 mg  50 mg Intramuscular TID PRN Smith, Annie B, NP   50 mg at 01/30/24 0019   And   LORazepam (ATIVAN) injection 2 mg  2 mg Intramuscular TID PRN Smith, Annie B, NP  2 mg at 01/30/24 0018   hydrOXYzine (ATARAX) tablet 25 mg  25 mg Oral TID PRN Smith, Annie B, NP   25 mg at 01/28/24 2112   magnesium hydroxide (MILK OF MAGNESIA) suspension 30 mL  30 mL Oral Daily PRN Smith, Annie B, NP       nicotine (NICODERM CQ - dosed in mg/24 hours) patch 21 mg  21 mg Transdermal Daily Jadapalle, Sree, MD   21 mg at 02/01/24 9160   nicotine polacrilex (NICORETTE) gum 2 mg  2 mg Oral PRN Jadapalle, Sree, MD   2 mg at 01/28/24 1241   risperiDONE (RISPERDAL) tablet 3 mg  3 mg Oral QHS Millington, Matthew E, PA-C   3 mg at 01/31/24 2106   traZODone (DESYREL) tablet 50 mg  50 mg Oral QHS PRN Smith, Annie B, NP        Lab Results:  No results found for this or any previous visit (from the past 48 hours).   Blood Alcohol level:  Lab Results  Component Value Date   [facility] <15 01/26/2024   ETH <10 12/07/2021    Metabolic Disorder Labs: Lab Results  Component Value Date   HGBA1C 4.8 01/28/2024   MPG 91.06 01/28/2024   No results found for: PROLACTIN Lab Results  Component Value Date   CHOL 131 01/28/2024   TRIG 52 01/28/2024   HDL 36 (L) 01/28/2024   CHOLHDL 3.6 01/28/2024   VLDL 10 01/28/2024   LDLCALC 84 01/28/2024    Physical Findings: AIMS:  , ,  ,  ,    CIWA:    COWS:      Psychiatric Specialty Exam:  Presentation  General Appearance:  Disheveled  Eye Contact: Poor  Speech: Garbled  Speech Volume: Normal    Mood and Affect  Mood: -- (Tired)  Affect: Appropriate   Thought Process  Thought Processes: Linear  Orientation:Full (Time, Place and Person)  Thought Content:WDL  Hallucinations: Denies  Ideas of Reference:None  Suicidal Thoughts: Denies  Homicidal Thoughts: Denies   Sensorium  Memory: Recent Fair; Immediate  Fair  Judgment: Poor  Insight: Poor   Art Therapist  Concentration: Fair  Attention Span: Fair  Recall: [employer] of Knowledge: Fair  Language: Fair   Psychomotor Activity  Psychomotor Activity: Normal  Musculoskeletal: Strength & Muscle Tone: within normal limits Gait & Station: normal Assets  Assets: Manufacturing Systems Engineer; Housing    Physical Exam: Physical Exam Vitals and nursing note reviewed.  Constitutional:      Appearance: Normal appearance.  Cardiovascular:     Rate and Rhythm: Tachycardia present.  Pulmonary:     Effort: Pulmonary effort is normal.  Neurological:     Mental Status: She is alert and oriented to person, place, and time.    Review of Systems  Eyes:  Negative for blurred vision.  Respiratory:  Negative for shortness of breath.   Cardiovascular:  Negative for chest pain.  Gastrointestinal:  Negative for diarrhea, nausea and vomiting.  Neurological:  Negative for dizziness.  Psychiatric/Behavioral:  Positive for substance abuse. Negative for hallucinations and suicidal ideas. The patient is not nervous/anxious.   All other systems reviewed and are negative.  Blood pressure 93/61, pulse (!) 120, temperature 98.5 F (36.9 C), temperature source Oral, resp. rate 18, height 5' 6 (1.676 m), weight 62.1 kg, SpO2 97%, unknown if currently breastfeeding. Body mass index is 22.11 kg/m.  Diagnosis: Principal Problem:   Substance-induced psychotic disorder [facility]) Active Problems:   Anxiety  Depression   Hypotension   Chest pain   PLAN: Safety and Monitoring:  -- Involuntary admission to inpatient psychiatric unit for safety, stabilization and treatment  -- Daily contact with patient to assess and evaluate symptoms and progress in treatment  -- Patient's case to be discussed in multi-disciplinary team meeting  -- Observation Level : q15 minute checks  -- Vital signs:  q12 hours  -- Precautions: suicide, elopement, and  assault -- Encouraged patient to participate in unit milieu and in scheduled group therapies   2. Psychiatric Treatment:  Scheduled Medications: Discontinued Zyprexa restarted risperidone 3 mg nightly this is their home medication. Continue Suboxone TID, dose reduced to buprenorphine-naloxone  (SUBOXONE) 2-0.5 mg per SL tablet 2 tablets Nicotine replacement Vitals Q2 hours due to somnolence (can be discontinued when patient awake and active) Patient not agreeable to further medication updates.  Prazosin and Paxil were not restarted as discussed above.   -- The risks/benefits/side-effects/alternatives to this medication were discussed in detail with the patient and time was given for questions. The patient consents to medication trial.  3. Medical Issues Being Addressed:  Hospitalist consulted for chest pain/low BP/elevated HR (01/29/2024).   As per Hospitalist: Chest x-ray was negative for acute cardiopulmonary process, EKG showed sinus rhythm, troponin was negative.  CMP was unremarkable.   I suspect patient's chest pain is related to anxiety with possibility of withdrawal from methamphetamine and psychosomatic disturbances.   Hypotension - per hospitalist I specked this is secondary to poor p.o. intake.  I agree with nursing, giving patient Gatorade bottles, 20 ounce bottles, and encouraging water intake. I counseled patient that she needs to drink at least 3 of those bottles per day.  4. Discharge Planning:   -- Social work and case management to assist with discharge planning and identification of [facility] follow-up needs prior to discharge  -- Estimated LOS: 5-7 days  Camelia LITTIE Lukes, PA-C 02/01/2024, 10:34 AM

## 2024-02-01 NOTE — Progress Notes (Signed)
   02/01/24 2100  Psych Admission Type (Psych Patients Only)  Admission Status Involuntary  Psychosocial Assessment  Patient Complaints Anxiety  Eye Contact Fair  Facial Expression Animated  Affect Anxious  Speech Logical/coherent  Interaction Minimal  Motor Activity Slow  Appearance/Hygiene Unremarkable  Behavior Characteristics Cooperative  Mood Preoccupied;Pleasant  Thought Process  Coherency WDL  Content WDL  Delusions None reported or observed  Perception WDL  Hallucination None reported or observed  Judgment Poor  Confusion None  Danger to Self  Current suicidal ideation? Denies  Danger to Others  Danger to Others None reported or observed

## 2024-02-01 NOTE — Group Note (Signed)
 LCSW Group Therapy Note   Group Date: 02/01/2024 Start Time: 1310 End Time: 1400   Type of Therapy and Topic:  Group Therapy: Boundaries  Participation Level:  Minimal  Description of Group: This group will address the use of boundaries in their personal lives. Patients will explore why boundaries are important, the difference between healthy and unhealthy boundaries, and negative and postive outcomes of different boundaries and will look at how boundaries can be crossed.  Patients will be encouraged to identify current boundaries in their own lives and identify what kind of boundary is being set. Facilitators will guide patients in utilizing problem-solving interventions to address and correct types boundaries being used and to address when no boundary is being used. Understanding and applying boundaries will be explored and addressed for obtaining and maintaining a balanced life. Patients will be encouraged to explore ways to assertively make their boundaries and needs known to significant others in their lives, using other group members and facilitator for role play, support, and feedback.  Therapeutic Goals:  1.  Patient will identify areas in their life where setting clear boundaries could be  used to improve their life.  2.  Patient will identify signs/triggers that a boundary is not being respected. 3.  Patient will identify two ways to set boundaries in order to achieve balance in  their lives: 4.  Patient will demonstrate ability to communicate their needs and set boundaries  through discussion and/or role plays  Summary of Patient Progress:   Patient was present in group.  Patient appeared attentive however was engaged.   Therapeutic Modalities:   Cognitive Behavioral Therapy Solution-Focused Therapy  Sherryle JINNY Margo, LCSW 02/01/2024  1:55 PM

## 2024-02-01 NOTE — Group Note (Signed)
 Date:  02/01/2024 Time:  8:41 PM  Group Topic/Focus:  Wrap-Up Group:   The focus of this group is to help patients review their daily goal of treatment and discuss progress on daily workbooks.    Participation Level:  Active  Participation Quality:  Appropriate, Attentive, and Sharing  Affect:  Appropriate  Cognitive:  Appropriate  Insight: Appropriate and Good  Engagement in Group:  Engaged  Modes of Intervention:  Discussion  Additional Comments:     Kerri Katz 02/01/2024, 8:41 PM

## 2024-02-01 NOTE — Group Note (Signed)
 Recreation Therapy Group Note   Group Topic:General Recreation  Group Date: 02/01/2024 Start Time: 1510 End Time: 1600 Facilitators: Celestia Jeoffrey BRAVO, LRT, CTRS Location: Courtyard  Group Description: [employer]. LRT and patients played games of basketball, drew with chalk, and played corn hole while outside in the courtyard while getting fresh air and sunlight. Music was being played in the background. LRT and peers conversed about different games they have played before, what they do in their free time and anything else that is on their minds. LRT encouraged pts to drink water after being outside, sweating and getting their heart rate up.  Goal Area(s) Addressed: Patient will build on frustration tolerance skills. Patients will partake in a competitive play game with peers. Patients will gain knowledge of new leisure interest/hobby.     Affect/Mood: Appropriate   Participation Level: Active   Participation Quality: Independent   Behavior: Appropriate   Speech/Thought Process: Coherent   Insight: Good   Judgement: Good   Modes of Intervention: Activity   Patient Response to Interventions:  Receptive   Education Outcome:  Acknowledges education   Clinical Observations/Individualized Feedback: [facility] was active in their participation of session activities and group discussion. Pt interacted well with LRT and peers duration of session.    Plan: Continue to engage patient in RT group sessions 2-3x/week.   Jeoffrey BRAVO Celestia, LRT, CTRS 02/01/2024 5:14 PM

## 2024-02-01 NOTE — Group Note (Signed)
 Recreation Therapy Group Note   Group Topic:Self-Esteem  Group Date: 02/01/2024 Start Time: 1000 End Time: 1050 Facilitators: Celestia Jeoffrey BRAVO, LRT, CTRS Location: Craft Room  Group Description: Positive Affirmation Worksheet. Patients and LRT discussed the importance of self-love/self-esteem and things that cause it to fluctuate, including our mental health. Patients completed a worksheet that helps them identify 24 different strengths and qualities about themselves. Pt encouraged to read aloud at least 3 off their sheet to the group. LRT and pts discussed how this can be applied to daily life post-discharge. After completing worksheet, patients played Positive Affirmation Bingo and won candy, approved by [employer], as prizes.   Goal Area(s) Addressed: Patient will identify positive qualities about themselves. Patient will learn new positive affirmations.  Patient will recite positive qualities and affirmations aloud to the group.  Patient will practice positive self-talk.  Patient will increase communication.   Affect/Mood: N/A   Participation Level: Minimal    Clinical Observations/Individualized Feedback: Bailey [facility] was present in group. Pt appeared to be heavily sedated and falling asleep sitting up with her eyebrows raised and mouth slightly open. Pt did not complete handout. RN aware.   Plan: Continue to engage patient in RT group sessions 2-3x/week.   Jeoffrey BRAVO Celestia, LRT, CTRS 02/01/2024 11:31 AM

## 2024-02-01 NOTE — Progress Notes (Signed)
  pulse 115 at 0600 and 0829 125, BP is 93/61. denies any symptoms notified Jadapalle

## 2024-02-01 NOTE — Progress Notes (Signed)
   02/01/24 0000  Psych Admission Type (Psych Patients Only)  Admission Status Involuntary  Psychosocial Assessment  Patient Complaints Anxiety  Eye Contact Fair  Facial Expression Flat  Affect Anxious;Preoccupied  Speech Logical/coherent  Interaction Guarded;Seclusive  Motor Activity Other (Comment) (wdl)  Appearance/Hygiene Unremarkable  Behavior Characteristics Unwilling to participate;Anxious;Restless  Mood Labile  Thought Process  Coherency WDL  Content Preoccupation;Paranoia  Delusions None reported or observed  Perception WDL  Hallucination Visual  Judgment Limited  Confusion None  Danger to Self  Current suicidal ideation? Denies  Danger to Others  Danger to Others None reported or observed   Prior to settling into bed, pt was restless, reported VH - I am seeing things.   She rolled her bed out of her room and slept in the bed while it was in the hallway.

## 2024-02-02 DIAGNOSIS — F419 Anxiety disorder, unspecified: Secondary | ICD-10-CM | POA: Diagnosis not present

## 2024-02-02 DIAGNOSIS — F32A Depression, unspecified: Secondary | ICD-10-CM | POA: Diagnosis not present

## 2024-02-02 DIAGNOSIS — F19959 Other psychoactive substance use, unspecified with psychoactive substance-induced psychotic disorder, unspecified: Secondary | ICD-10-CM | POA: Diagnosis not present

## 2024-02-02 DIAGNOSIS — R079 Chest pain, unspecified: Secondary | ICD-10-CM | POA: Diagnosis not present

## 2024-02-02 MED ORDER — RISPERIDONE 1 MG PO TABS
1.0000 mg | ORAL_TABLET | Freq: Every day | ORAL | Status: DC
  Administered 2024-02-02 – 2024-02-03 (×2): 1 mg via ORAL
  Filled 2024-02-02 (×2): qty 1

## 2024-02-02 NOTE — Group Note (Signed)

## 2024-02-02 NOTE — Plan of Care (Signed)
   Problem: Education: Goal: Knowledge of [place] [facility] Education information/materials will improve Outcome: Progressing

## 2024-02-02 NOTE — Group Note (Signed)
 Date:  02/02/2024 Time:  11:13 AM  Group Topic/Focus:  Pet Therapy: Rollo the Dog has made an appearance on the unit to help contribute to the patients' mental health!    Participation Level:  Did Not Attend   [name] 02/02/2024, 11:13 AM

## 2024-02-02 NOTE — Group Note (Signed)
 Date:  02/02/2024 Time:  10:56 AM  Group Topic/Focus:  Early Warning Signs:   The focus of this group is to help patients identify signs or symptoms they exhibit before slipping into an unhealthy state or crisis.    Participation Level:  Active  Participation Quality:  Appropriate  Affect:  Appropriate  Cognitive:  Appropriate  Insight: Appropriate  Engagement in Group:  Engaged  Modes of Intervention:  Activity  Additional Comments:    Bailey [facility] 02/02/2024, 10:56 AM

## 2024-02-02 NOTE — Progress Notes (Signed)
 Notified MD Japapalle and Camelia Lukes that husband would like a call from provider

## 2024-02-02 NOTE — Progress Notes (Addendum)
   02/02/24 0613 02/02/24 0756 02/02/24 0853  Vital Signs  Temp 98.5 F (36.9 C) 98.7 F (37.1 C)  --   Temp Source Oral Oral  --   Pulse Rate (!) 114 (!) 110 (!) 101  Pulse Rate Source Monitor Monitor  --   Resp 12 14  --   BP 99/62 (!) 84/59 (map 69) (!) 93/59 (map 70)  BP Location Right Arm Right Arm  --   BP Method Automatic Automatic  --   Patient Position (if appropriate) Sitting Sitting  --    Notified MD jadapalla and [employer] PA of patients VS and patient was lethargic this morning on assessment pt denies lightheaded or dizziness. Pt was falling asleep eating breakfast. Rn held 0800 suboxone. MD made adjustments medications. Will continue to monitor provided patient with Gatorade

## 2024-02-02 NOTE — Progress Notes (Signed)
 [facility] MD Progress Note  02/02/2024 11:54 AM Bailey Cabrera  MRN:  [id number]   Subjective:  Chart reviewed, case discussed in multidisciplinary meeting, patient seen during rounds.   11/19: On interview today, patient is noted to be alert, oriented, and irritable. She engages minimally with provider.  She denies SI/HI/plan and denies hallucinations at time of interview.  Per nursing report, she endorsed auditory and visual hallucinations last night.  She required PRN for agitation overnight.  Vital signs continue to be closely monitored.  Patient is noted to be drowsy on and off throughout the day.  Suboxone  dose decreased and will decrease Risperdal  dose.  She continues to be discharged focused and future oriented. Provider discussed plan of care with patient's husband, Christopher.  Husband has been in contact with patient and has visited patient throughout hospitalization, and feels she is improving.  All questions answered.  11/18: On interview today, patient is noted to be alert and oriented.  Vital signs continue to be closely monitored.  Patient is noted to be drowsy on and off throughout the day, but is alert and engaged at time of interview.  She denies dizziness or lightheadedness. Suboxone  held for first 2 out of 3 doses today.  Will decrease Suboxone  dose.  Patient transitioned back to home Risperdal . Given intermittent compliance we will continue to hold Paxil and given blood pressures will continue to hold prazosin.  Patient states she is feeling well physically.  She continues to be future oriented and discharged focused.  She denies SI/HI/plan and denies hallucinations.  She denies current symptoms of depression or anxiety.  Per nursing report, she endorsed hallucinations last night.  She reports fair sleep and appetite.  Patient was evaluated by hospitalist on 11/15 for chest pain and hypotension.  Hospitalist suspects hypotension secondary to poor p.o. intake, recommended giving patient 20  ounce Gatorade bottles, 3 per day and encouraging water intake.  Hospitalist suspects chest pain related to anxiety with possibility of withdrawal from methamphetamine and psychosomatic disturbances.  Patient denies chest pain today.  11/17: On assessment patient is found in bed they are tired and somnolent.  They deny SI, HI, and AVH.  They do not appear to be responding to internal stimuli.  They are irritable indicating do not want to talk and that they want to be discharged.  Will transition patient back to home Risperdal .  Given intermittent compliance we will continue to hold Paxil and given blood pressures will continue to hold prazosin and they are not agreeable to further adjustments at this time.  Zyprexa  was discontinued in favor of Risperdal .  Patient is encouraged to hydrate reviewed tachycardia with them also reviewed blood pressure.   11/16: Upon assessment, patient presents as tired/somnolent. She reports they gave me a shot. Patient denies SI, HI, and AVH. She does not appear to be responding to external stimuli nor is there evidence of paranoia/delusions at this time. Patient does not want to talk further and wants to sleep. Encouraged fluids and intake of food.   Per nursing report, overnight patient presented paranoid and was experiencing AVH. She brought her bed into the hallway due to anxiety/fear of hallucinations (see nursing note). Due to acute distress and anxiety, patient was medication utilizing agitation protocol. She was given 10 mg Haldol , 2 mg Ativan , and 50 mg Benadryl  IM (0018 am). This was a few hours after receiving nightly PO Zyprexa  5 mg.   Due to current level of somnolence and patient likely metabolizing IM medications,  Suboxone  dosing held to avoid respiratory depression. Can resume once patient more awake/less somnolence. At request, nursing updated vitals. Vitals remain consistent with previous values. Vitals Q2 hours for remainder of day until patient more  awake.   11/15: Upon assessment today patient is found on the phone. Patient is able to engage with clinical research associate and expresses desire to go home. Patient reports her children who reside in [place]  are coming in tomorrow and she does not get to see them often. She then reports they are leaving Monday night. She reports that she lives with her pawpaw and he has dementia. He has dementia and I have schizophrenia, so we both see things. She then reports that they exacerbate each other seeing things. She denies AVH since being in the hospital. Patient minimizing substance use - when asked directly about methamphetamines, she reports she began using because of trauma. She reports she had sobriety for a period of time until her and her husband split up. Patient reporting she was told her stay would only be 3-4 days by the police that brought her to the hospital. Patient also reporting increased sedation with night medication. Agreed to decrease dosing at this time to see if improvement with sedation. Patient denies SI, HI, and AVH.   Of note, patient's BP has been running low. Today it was low, with elevated HR, and she began reporting right chest pain. Hospitalist consulted. No significant findings.   From Admission: On interview today, patient endorses methamphetamine use approximately 5 days ago and cocaine use over 1 week ago.  She reported visual hallucinations upon ED presentation but now denies hallucinations.  She denies SI/HI/plan.  She denies previous suicide attempts or self-harm.  She reports decreased motivation and decreased energy, as well as depressed mood.  She denies sleep disturbance or appetite changes.  She endorses anxiety, stating she worries about many different things and has difficulty relaxing.  She reports a history of trauma related to the loss of her siblings and her father while she was in prison, states she was unable to attend their funerals. Patient states she has been out of  prison for 2 years.  She reports history of 9 felony charges.  She states she has an upcoming court date in February 2026 related to a suspended license.  She reports home medication of Paxil but states she has not taken this for about 1 week.  She also reports home medication of prazosin, provider discussed with patient this medication will be held at this time due to patient's lower blood pressure.  Patient states her blood pressure tends to run low.  She denies dizziness, lightheadedness, fatigue, or vision changes.  Past Psychiatric History: see h&P Family History: History reviewed. No pertinent family history. Social History:  Social History   Substance and Sexual Activity  Alcohol Use No     Social History   Substance and Sexual Activity  Drug Use Yes   Types: Marijuana, Cocaine   Comment: Heroin last use 07/22/2019, marijuana last use 07/22/2019, meth last use 2 weeks ago    Social History   Socioeconomic History   Marital status: Married    Spouse name: Not on file   Number of children: 4   Years of education: Not on file   Highest education level: Not on file  Occupational History   Not on file  Tobacco Use   Smoking status: Every Day    Current packs/day: 1.50    Average packs/day: 1.5 packs/day for 5.0  years (7.5 ttl pk-yrs)    Types: Cigarettes   Smokeless tobacco: Never  Vaping Use   Vaping status: Never Used  Substance and Sexual Activity   Alcohol use: No   Drug use: Yes    Types: Marijuana, Cocaine    Comment: Heroin last use 07/22/2019, marijuana last use 07/22/2019, meth last use 2 weeks ago   Sexual activity: Not Currently    Birth control/protection: Surgical  Other Topics Concern   Not on file  Social History Narrative   Not on file   Social Drivers of Health   Financial Resource Strain: Low Risk (07/07/2022)   Received from [facility]   Overall Financial Resource Strain (CARDIA)    Difficulty of Paying Living Expenses: Not hard at all   Food Insecurity: No Food Insecurity (01/28/2024)   Hunger Vital Sign    Worried About Running Out of Food in the Last Year: Never true    Ran Out of Food in the Last Year: Never true  Transportation Needs: No Transportation Needs (01/28/2024)   PRAPARE - Administrator, Civil Service (Medical): No    Lack of Transportation (Non-Medical): No  Physical Activity: Not on file  Stress: Not on file  Social Connections: Moderately Integrated (01/28/2024)   Social Connection and Isolation Panel    Frequency of Communication with Friends and Family: Twice a week    Frequency of Social Gatherings with Friends and Family: Twice a week    Attends Religious Services: 1 to 4 times per year    Active Member of Clubs or Organizations: Yes    Attends Engineer, Structural: More than 4 times per year    Marital Status: Never married   Past Medical History:  Past Medical History:  Diagnosis Date   Anemia    Anxiety and depression    Asthma    Bipolar 1 disorder (HCC)    Drug use    Herpes genitalis 07/20/2014   Has been tx'd since 32weeks for suppresion   Hypotension    Marijuana use    Schizophrenia (HCC) 2022   diagnosed while in prision in 2022   Seizures (HCC)    During pregnancy; most recent April 2016    Past Surgical History:  Procedure Laterality Date   sonohystogram  2012   TUBAL LIGATION N/A 02/29/2016   Procedure: POST PARTUM TUBAL LIGATION;  Surgeon: Garnette JONETTA Mace, MD;  Location: [facility] ORS;  Service: Gynecology;  Laterality: N/A;   WISDOM TOOTH EXTRACTION Bilateral 2011   Post op infection    Current Medications: Current Facility-Administered Medications  Medication Dose Route Frequency Provider Last Rate Last Admin   acetaminophen  (TYLENOL ) tablet 650 mg  650 mg Oral Q6H PRN Smith, Annie B, NP   650 mg at 01/29/24 1127   alum & mag hydroxide-simeth (MAALOX/MYLANTA) 200-200-20 MG/5ML suspension 30 mL  30 mL Oral Q4H PRN Smith, Annie B, NP        buprenorphine -naloxone  (SUBOXONE ) 2-0.5 mg per SL tablet 2 tablet  2 tablet Sublingual TID Arletha Marschke L, PA-C       haloperidol  (HALDOL ) tablet 5 mg  5 mg Oral TID PRN Smith, Annie B, NP   5 mg at 02/01/24 2252   And   diphenhydrAMINE  (BENADRYL ) capsule 50 mg  50 mg Oral TID PRN Smith, Annie B, NP   50 mg at 02/01/24 2252   haloperidol  lactate (HALDOL ) injection 5 mg  5 mg Intramuscular TID PRN Smith, Annie B, NP  And   diphenhydrAMINE  (BENADRYL ) injection 50 mg  50 mg Intramuscular TID PRN Smith, Annie B, NP       And   LORazepam  (ATIVAN ) injection 2 mg  2 mg Intramuscular TID PRN Smith, Annie B, NP       haloperidol  lactate (HALDOL ) injection 10 mg  10 mg Intramuscular TID PRN Smith, Annie B, NP   10 mg at 01/30/24 0018   And   diphenhydrAMINE  (BENADRYL ) injection 50 mg  50 mg Intramuscular TID PRN Smith, Annie B, NP   50 mg at 01/30/24 0019   And   LORazepam  (ATIVAN ) injection 2 mg  2 mg Intramuscular TID PRN Smith, Annie B, NP   2 mg at 01/30/24 0018   hydrOXYzine  (ATARAX ) tablet 25 mg  25 mg Oral TID PRN Smith, Annie B, NP   25 mg at 01/28/24 2112   magnesium  hydroxide (MILK OF MAGNESIA) suspension 30 mL  30 mL Oral Daily PRN Smith, Annie B, NP       nicotine  (NICODERM CQ  - dosed in mg/24 hours) patch 21 mg  21 mg Transdermal Daily Jadapalle, Sree, MD   21 mg at 02/02/24 9146   nicotine  polacrilex (NICORETTE ) gum 2 mg  2 mg Oral PRN Jadapalle, Sree, MD   2 mg at 01/28/24 1241   risperiDONE  (RISPERDAL ) tablet 1 mg  1 mg Oral QHS Jadapalle, Sree, MD       traZODone  (DESYREL ) tablet 50 mg  50 mg Oral QHS PRN Smith, Annie B, NP        Lab Results:  No results found for this or any previous visit (from the past 48 hours).   Blood Alcohol level:  Lab Results  Component Value Date   [facility] <15 01/26/2024   ETH <10 12/07/2021    Metabolic Disorder Labs: Lab Results  Component Value Date   HGBA1C 4.8 01/28/2024   MPG 91.06 01/28/2024   No results found for: PROLACTIN Lab  Results  Component Value Date   CHOL 131 01/28/2024   TRIG 52 01/28/2024   HDL 36 (L) 01/28/2024   CHOLHDL 3.6 01/28/2024   VLDL 10 01/28/2024   LDLCALC 84 01/28/2024    Physical Findings: AIMS:  , ,  ,  ,    CIWA:    COWS:      Psychiatric Specialty Exam:  Presentation  General Appearance:  Disheveled  Eye Contact: Poor  Speech: Garbled  Speech Volume: Normal    Mood and Affect  Mood: -- (Tired)  Affect: Irritable  Thought Process  Thought Processes: Linear  Orientation:Full (Time, Place and Person)  Thought Content:WDL  Hallucinations: Denies, though endorsed auditory and visual hallucinations overnight per nursing report  Ideas of Reference:None  Suicidal Thoughts: Denies  Homicidal Thoughts: Denies   Sensorium  Memory: Recent Fair; Immediate Fair  Judgment: Poor  Insight: Poor   Art Therapist  Concentration: Fair  Attention Span: Fair  Recall: [employer] of Knowledge: Fair  Language: Fair   Psychomotor Activity  Psychomotor Activity: Normal  Musculoskeletal: Strength & Muscle Tone: within normal limits Gait & Station: normal Assets  Assets: Manufacturing Systems Engineer; Housing    Physical Exam: Physical Exam Vitals and nursing note reviewed.  Constitutional:      Appearance: Normal appearance.  Cardiovascular:     Rate and Rhythm: Tachycardia present.  Pulmonary:     Effort: Pulmonary effort is normal.  Neurological:     Mental Status: She is alert and oriented to person, place, and time.  Review of Systems  Eyes:  Negative for blurred vision.  Respiratory:  Negative for shortness of breath.   Cardiovascular:  Negative for chest pain.  Gastrointestinal:  Negative for diarrhea, nausea and vomiting.  Neurological:  Negative for dizziness.  Psychiatric/Behavioral:  Positive for substance abuse. Negative for hallucinations and suicidal ideas. The patient is not nervous/anxious.   All other systems  reviewed and are negative.  Blood pressure (!) 93/59, pulse (!) 101, temperature 98.7 F (37.1 C), temperature source Oral, resp. rate 14, height 5' 6 (1.676 m), weight 62.1 kg, SpO2 98%, unknown if currently breastfeeding. Body mass index is 22.11 kg/m.  Diagnosis: Principal Problem:   Substance-induced psychotic disorder (HCC) Active Problems:   Anxiety   Depression   Hypotension   Chest pain   PLAN: Safety and Monitoring:  -- Involuntary admission to inpatient psychiatric unit for safety, stabilization and treatment  -- Daily contact with patient to assess and evaluate symptoms and progress in treatment  -- Patient's case to be discussed in multi-disciplinary team meeting  -- Observation Level : q15 minute checks  -- Vital signs:  q12 hours  -- Precautions: suicide, elopement, and assault -- Encouraged patient to participate in unit milieu and in scheduled group therapies   2. Psychiatric Treatment:  Scheduled Medications: Discontinued Zyprexa  restarted risperidone  3 mg nightly this is their home medication. Continue Suboxone  TID, dose reduced to buprenorphine -naloxone  (SUBOXONE ) 2-0.5 mg per SL tablet 2 tablets Nicotine  replacement Vitals Q2 hours due to somnolence (can be discontinued when patient awake and active) Patient not agreeable to further medication updates.  Prazosin and Paxil were not restarted as discussed above.   -- The risks/benefits/side-effects/alternatives to this medication were discussed in detail with the patient and time was given for questions. The patient consents to medication trial.  3. Medical Issues Being Addressed:  Hospitalist consulted for chest pain/low BP/elevated HR (01/29/2024).   As per Hospitalist: Chest x-ray was negative for acute cardiopulmonary process, EKG showed sinus rhythm, troponin was negative.  CMP was unremarkable.   I suspect patient's chest pain is related to anxiety with possibility of withdrawal from methamphetamine  and psychosomatic disturbances.   Hypotension - per hospitalist I specked this is secondary to poor p.o. intake.  I agree with nursing, giving patient Gatorade bottles, 20 ounce bottles, and encouraging water intake. I counseled patient that she needs to drink at least 3 of those bottles per day.  4. Discharge Planning:   -- Social work and case management to assist with discharge planning and identification of hospital follow-up needs prior to discharge  -- Estimated LOS: 5-7 days  Camelia LITTIE Lukes, PA-C 02/02/2024, 11:54 AM

## 2024-02-02 NOTE — Progress Notes (Signed)
   02/02/24 0900  Psych Admission Type (Psych Patients Only)  Admission Status Involuntary  Psychosocial Assessment  Patient Complaints None  Eye Contact Fair  Facial Expression Animated  Affect Anxious  Speech Logical/coherent  Interaction Superficial  Motor Activity Slow  Appearance/Hygiene Unremarkable  Behavior Characteristics Cooperative  Mood Labile  Thought Process  Coherency WDL  Content WDL  Delusions None reported or observed  Perception WDL  Hallucination None reported or observed (states only happends at night)  Judgment Poor  Confusion None  Danger to Self  Current suicidal ideation? Denies  Danger to Others  Danger to Others None reported or observed

## 2024-02-02 NOTE — Progress Notes (Signed)
 Patient was observed at the start of shift appearing anxious and depressed, and was noted to be tearful multiple times. Patient was alert and oriented and denied pain or suicidal ideation. However, she endorsed experiencing both visual and auditory hallucinations. She remained compliant with medications.  Patient was observed lying in a bed placed in the hallway, stating that the physicians allowed her to sleep there because she was scared to sleep in her room. Throughout the shift, patient repeatedly returned to the nurses' station reporting that her bed was moving and that water was coming from the ceiling. Writer accompanied the patient to inspect the area; no water was found, and the patient's bed was confirmed to be locked. Patient was encouraged to rest but later returned to the nurses' station stating, "People are following me to kill me. I know I am schizophrenic." PRN Haldol was administered for mild agitation along with Benadryl , with good effect and comfort and comfort and therapeutic support was given. Patient was observed sleeping at approximately 0100.  No unsafe behaviors or concerns noted at this time. Safety checks and Q15-minute rounding were completed as ordered.

## 2024-02-02 NOTE — Group Note (Signed)
 Date:  02/02/2024 Time:  5:08 PM  Group Topic/Focus:  Wellness Toolbox:   The focus of this group is to discuss various aspects of wellness, balancing those aspects and exploring ways to increase the ability to experience wellness.  Patients will create a wellness toolbox for use upon discharge.    Participation Level:  Active  Participation Quality:  Appropriate  Affect:  Appropriate  Cognitive:  Appropriate  Insight: Appropriate  Engagement in Group:  Engaged  Modes of Intervention:  Activity and Socialization  Additional Comments:    Deitra Caron Mainland 02/02/2024, 5:08 PM

## 2024-02-02 NOTE — Group Note (Signed)
 Date:  02/02/2024 Time:  8:52 PM  Group Topic/Focus:  Wrap-Up Group:   The focus of this group is to help patients review their daily goal of treatment and discuss progress on daily workbooks.    Participation Level:  Active  Participation Quality:  Drowsy  Affect:  Appropriate  Cognitive:  Appropriate and Oriented  Insight: Appropriate and Good  Engagement in Group:  Engaged  Modes of Intervention:  Orientation  Additional Comments:     Arlester CHRISTELLA Servant 02/02/2024, 8:52 PM

## 2024-02-02 NOTE — Progress Notes (Signed)
   02/02/24 1451  Vital Signs  Pulse Rate (!) 102  BP (!) 88/70 (map 77)   Notified MD jadapalle and Crystal PA no new orders 1400 suboxone  held

## 2024-02-02 NOTE — BH IP Treatment Plan (Signed)
 Interdisciplinary Treatment and Diagnostic Plan Update  02/02/2024 Time of Session: 1400 Bailey Cabrera MRN: [id number]  Principal Diagnosis: Substance-induced psychotic disorder [facility])  Secondary Diagnoses: Principal Problem:   Substance-induced psychotic disorder (HCC) Active Problems:   Anxiety   Depression   Hypotension   Chest pain   Current Medications:  Current Facility-Administered Medications  Medication Dose Route Frequency Provider Last Rate Last Admin   acetaminophen  (TYLENOL ) tablet 650 mg  650 mg Oral Q6H PRN Smith, Annie B, NP   650 mg at 01/29/24 1127   alum & mag hydroxide-simeth (MAALOX/MYLANTA) 200-200-20 MG/5ML suspension 30 mL  30 mL Oral Q4H PRN Smith, Annie B, NP       buprenorphine-naloxone  (SUBOXONE) 2-0.5 mg per SL tablet 2 tablet  2 tablet Sublingual TID Hunter, Crystal L, PA-C       haloperidol (HALDOL) tablet 5 mg  5 mg Oral TID PRN Smith, Annie B, NP   5 mg at 02/01/24 2252   And   diphenhydrAMINE  (BENADRYL ) capsule 50 mg  50 mg Oral TID PRN Smith, Annie B, NP   50 mg at 02/01/24 2252   haloperidol lactate (HALDOL) injection 5 mg  5 mg Intramuscular TID PRN Smith, Annie B, NP       And   diphenhydrAMINE  (BENADRYL ) injection 50 mg  50 mg Intramuscular TID PRN Smith, Annie B, NP       And   LORazepam (ATIVAN) injection 2 mg  2 mg Intramuscular TID PRN Smith, Annie B, NP       haloperidol lactate (HALDOL) injection 10 mg  10 mg Intramuscular TID PRN Smith, Annie B, NP   10 mg at 01/30/24 0018   And   diphenhydrAMINE  (BENADRYL ) injection 50 mg  50 mg Intramuscular TID PRN Smith, Annie B, NP   50 mg at 01/30/24 0019   And   LORazepam (ATIVAN) injection 2 mg  2 mg Intramuscular TID PRN Smith, Annie B, NP   2 mg at 01/30/24 0018   hydrOXYzine (ATARAX) tablet 25 mg  25 mg Oral TID PRN Smith, Annie B, NP   25 mg at 01/28/24 2112   magnesium hydroxide (MILK OF MAGNESIA) suspension 30 mL  30 mL Oral Daily PRN Smith, Annie B, NP       nicotine (NICODERM CQ  - dosed in mg/24 hours) patch 21 mg  21 mg Transdermal Daily Jadapalle, Sree, MD   21 mg at 02/02/24 9146   nicotine polacrilex (NICORETTE) gum 2 mg  2 mg Oral PRN Jadapalle, Sree, MD   2 mg at 01/28/24 1241   risperiDONE (RISPERDAL) tablet 1 mg  1 mg Oral QHS Jadapalle, Sree, MD       traZODone (DESYREL) tablet 50 mg  50 mg Oral QHS PRN Smith, Annie B, NP       PTA Medications: Medications Prior to Admission  Medication Sig Dispense Refill Last Dose/Taking   desvenlafaxine (PRISTIQ) 25 MG 24 hr tablet Take 25 mg by mouth daily.      naloxone  (NARCAN ) nasal spray 4 mg/0.1 mL Spray intranasally for overdose 1 each 0    PARoxetine (PAXIL) 20 MG tablet Take 20 mg by mouth every evening.      prazosin (MINIPRESS) 2 MG capsule Take 2 mg by mouth at bedtime.      risperiDONE (RISPERDAL) 3 MG tablet Take 3 mg by mouth at bedtime.      SUBOXONE 8-2 MG FILM Place 1 Film under the tongue 3 (three) times daily.  Patient Stressors: Medication change or noncompliance   Substance abuse    Patient Strengths: Motivation for treatment/growth  Supportive family/friends   Treatment Modalities: Medication Management, Group therapy, Case management,  1 to 1 session with clinician, Psychoeducation, Recreational therapy.   Physician Treatment Plan for Primary Diagnosis: Substance-induced psychotic disorder (HCC) Long Term Goal(s): Improvement in symptoms so as ready for discharge   Short Term Goals: Ability to identify changes in lifestyle to reduce recurrence of condition will improve Ability to demonstrate self-control will improve Ability to identify and develop effective coping behaviors will improve Ability to identify triggers associated with substance abuse/mental health issues will improve  Medication Management: Evaluate patient's response, side effects, and tolerance of medication regimen.  Therapeutic Interventions: 1 to 1 sessions, Unit Group sessions and Medication  administration.  Evaluation of Outcomes: Progressing  Physician Treatment Plan for Secondary Diagnosis: Principal Problem:   Substance-induced psychotic disorder (HCC) Active Problems:   Anxiety   Depression   Hypotension   Chest pain  Long Term Goal(s): Improvement in symptoms so as ready for discharge   Short Term Goals: Ability to identify changes in lifestyle to reduce recurrence of condition will improve Ability to demonstrate self-control will improve Ability to identify and develop effective coping behaviors will improve Ability to identify triggers associated with substance abuse/mental health issues will improve     Medication Management: Evaluate patient's response, side effects, and tolerance of medication regimen.  Therapeutic Interventions: 1 to 1 sessions, Unit Group sessions and Medication administration.  Evaluation of Outcomes: Progressing   RN Treatment Plan for Primary Diagnosis: Substance-induced psychotic disorder (HCC) Long Term Goal(s): Knowledge of disease and therapeutic regimen to maintain health will improve  Short Term Goals: Ability to remain free from injury will improve, Ability to verbalize frustration and anger appropriately will improve, Ability to demonstrate self-control, Ability to participate in decision making will improve, Ability to verbalize feelings will improve, Ability to disclose and discuss suicidal ideas, Ability to identify and develop effective coping behaviors will improve, and Compliance with prescribed medications will improve  Medication Management: RN will administer medications as ordered by provider, will assess and evaluate patient's response and provide education to patient for prescribed medication. RN will report any adverse and/or side effects to prescribing provider.  Therapeutic Interventions: 1 on 1 counseling sessions, Psychoeducation, Medication administration, Evaluate responses to treatment, Monitor vital signs and  CBGs as ordered, Perform/monitor CIWA, COWS, AIMS and Fall Risk screenings as ordered, Perform wound care treatments as ordered.  Evaluation of Outcomes: Progressing   LCSW Treatment Plan for Primary Diagnosis: Substance-induced psychotic disorder [facility]) Long Term Goal(s): Safe transition to appropriate next level of care at discharge, Engage patient in therapeutic group addressing interpersonal concerns.  Short Term Goals: Engage patient in aftercare planning with referrals and resources, Increase social support, Increase ability to appropriately verbalize feelings, Increase emotional regulation, Facilitate acceptance of mental health diagnosis and concerns, Facilitate patient progression through stages of change regarding substance use diagnoses and concerns, Identify triggers associated with mental health/substance abuse issues, and Increase skills for wellness and recovery  Therapeutic Interventions: Assess for all discharge needs, 1 to 1 time with Social worker, Explore available resources and support systems, Assess for adequacy in community support network, Educate family and significant other(s) on suicide prevention, Complete Psychosocial Assessment, Interpersonal group therapy.  Evaluation of Outcomes: Progressing   Progress in Treatment: Attending groups: Yes. and No. Participating in groups: Yes. and No. Taking medication as prescribed: Yes. Toleration medication: Yes. Family/Significant other  contact made: No, will contact:  if given permission.  Patient understands diagnosis: Yes. Discussing patient identified problems/goals with staff: Yes. Medical problems stabilized or resolved: Yes. Denies suicidal/homicidal ideation: Yes. Issues/concerns per patient self-inventory: No. Other: none.   New problem(s) identified: No, Describe:  None 02/02/24 Update: No changes at this time.    New Short Term/Long Term Goal(s):detox, elimination of symptoms of psychosis, medication  management for mood stabilization; elimination of SI thoughts; development of comprehensive mental wellness/sobriety plan. 02/02/24 Update: No changes at this time.    Patient Goals:  I want to go home. 02/02/24 Update: No changes at this time.   Discharge Plan or Barriers: CSW to assist with the development of appropriate discharge plan. 02/02/24 Update: No changes at this time.     Reason for Continuation of Hospitalization: Aggression Anxiety Delusions  Depression Mania Suicidal ideation Withdrawal symptoms   Estimated Length of Stay: 1-7 days. 02/02/24 Update: No changes at this time.  Last 3 [place] Suicide Severity Risk Score: Flowsheet Row Admission (Current) from 01/27/2024 in [facility]INE ED from 01/26/2024 in [facility] Emergency Department at [facility] ED from 12/07/2021 in [facility] Emergency Department at [facility]  C-SSRS RISK CATEGORY No Risk No Risk No Risk    Last PHQ 2/9 Scores:     No data to display          Scribe for Treatment Team: Nadara JONELLE Fam, LCSW 02/02/2024 3:03 PM

## 2024-02-03 MED ORDER — RISPERIDONE 1 MG PO TABS: 1.0000 mg | ORAL_TABLET | Freq: Every day | ORAL | 0 refills | Status: AC

## 2024-02-03 NOTE — BHH Counselor (Signed)
 CSW met with pt briefly to discuss discharge/aftercare plans. She reported plans to return home upon discharge and that her husband would be providing transportation home. CSW informed her that her appointments had been re-scheduled through [facility] as she was not able to make them while in the hospital. Pt has clothes to wear home upon discharge. She reported that she is a smoker but denied any interest in cessation. Pt reported history of substance use but stated that she was not going to be doing that anymore because she was already connected with a group and would attend an AA/NA meeting. CSW inquired if there was anything else that pt may need from social work. Pt denied. No other concerns expressed. Contact ended without incident.   Nadara SAUNDERS. Chaim, MSW, LCSW, LCAS 02/03/2024 3:17 PM

## 2024-02-03 NOTE — Group Note (Signed)
 LCSW Group Therapy Note  Group Date: 02/03/2024 Start Time: 1300 End Time: 1400   Type of Therapy and Topic:  Group Therapy: Using I Statements  Participation Level:  Active  Description of Group:  Patients were asked to provide details of some interpersonal conflicts they have experienced. Patients were then educated about "I" statements, communication which focuses on feelings or views of the speaker rather than what the other person is doing. T group members were asked to reflect on past conflicts and to provide specific examples for utilizing "I" statements.  Therapeutic Goals:  Patients will verbalize understanding of ineffective communication and effective communication. Patients will be able to empathize with whom they are having conflict. Patients will practice effective communication in the form of "I" statements.    Summary of Patient Progress:  Patient shared their personal feelings about their current plans and endeavors. The patient was present and active throughout the session and proved open to feedback from CSW and peers. Patient demonstrated proficient insight into the subject matter, was respectful of peers, and was present throughout the entire session.  Therapeutic Modalities:   Cognitive Behavioral Therapy Solution-Focused Therapy    [name], LCSW 02/03/2024  2:06 PM

## 2024-02-03 NOTE — Plan of Care (Signed)

## 2024-02-03 NOTE — Group Note (Signed)
 Recreation Therapy Group Note   Group Topic:General Recreation  Group Date: 02/03/2024 Start Time: 1500 End Time: 1555 Facilitators: Celestia Jeoffrey BRAVO, LRT, CTRS Location: Courtyard  Group Description: [employer]. LRT and patients played games of basketball, drew with chalk, and played corn hole while outside in the courtyard while getting fresh air and sunlight. Music was being played in the background. LRT and peers conversed about different games they have played before, what they do in their free time and anything else that is on their minds. LRT encouraged pts to drink water after being outside, sweating and getting their heart rate up.  Goal Area(s) Addressed: Patient will build on frustration tolerance skills. Patients will partake in a competitive play game with peers. Patients will gain knowledge of new leisure interest/hobby.    Affect/Mood: Appropriate   Participation Level: Active   Participation Quality: Independent   Behavior: Appropriate   Speech/Thought Process: Coherent   Insight: Good   Judgement: Good   Modes of Intervention: Activity   Patient Response to Interventions:  Receptive   Education Outcome:  Acknowledges education   Clinical Observations/Individualized Feedback: Bailey Cabrera was active in their participation of session activities and group discussion. Pt interacted well with LRT and peers duration of session.    Plan: Continue to engage patient in RT group sessions 2-3x/week.   Jeoffrey BRAVO Celestia, LRT, CTRS 02/03/2024 5:01 PM

## 2024-02-03 NOTE — Plan of Care (Signed)
   Problem: Education: Goal: Knowledge of Bailey Cabrera General Education information/materials will improve Outcome: Progressing Goal: Emotional status will improve Outcome: Progressing Goal: Mental status will improve Outcome: Progressing Goal: Verbalization of understanding the information provided will improve Outcome: Progressing   Problem: Activity: Goal: Interest or engagement in activities will improve Outcome: Progressing Goal: Sleeping patterns will improve Outcome: Progressing   Problem: Coping: Goal: Ability to verbalize frustrations and anger appropriately will improve Outcome: Progressing Goal: Ability to demonstrate self-control will improve Outcome: Progressing

## 2024-02-03 NOTE — Group Note (Signed)
 Recreation Therapy Group Note   Group Topic:Stress Management  Group Date: 02/03/2024 Start Time: 1000 End Time: 1040 Facilitators: Celestia Jeoffrey BRAVO, LRT, CTRS Location: Dayroom  Group Description: PMR (Progressive Muscle Relaxation). LRT educates patients on what PMR is and the benefits that come from it. Patients are asked to sit with their feet flat on the floor while sitting up and all the way back in their chair, if possible. LRT and pts follow a prompt through a speaker that requires you to tense and release different muscles in their body and focus on their breathing. During session, lights are off and soft music is being played. Pts are given a stress ball to use if needed.   Goal Area(s) Addressed:  Patients will be able to describe progressive muscle relaxation.  Patient will practice using relaxation technique. Patient will identify a new coping skill.  Patient will follow multistep directions to reduce anxiety and stress.   Affect/Mood: N/A   Participation Level: Non-verbal    Clinical Observations/Individualized Feedback: Bailey Cabrera was present in group, however, was asleep sitting up.   Plan: Continue to engage patient in RT group sessions 2-3x/week.   Jeoffrey BRAVO Celestia, LRT, CTRS 02/03/2024 11:44 AM

## 2024-02-03 NOTE — BHH Suicide Risk Assessment (Signed)
 [facility] Discharge Suicide Risk Assessment   Principal Problem: Substance-induced psychotic disorder [facility]) Discharge Diagnoses: Principal Problem:   Substance-induced psychotic disorder (HCC) Active Problems:   Anxiety   Depression   Hypotension   Chest pain   Total Time spent with patient: 30 minutes  Musculoskeletal: Strength & Muscle Tone: within normal limits Gait & Station: normal Patient leans: N/A  Psychiatric Specialty Exam  Presentation  General Appearance:  Appropriate for Environment  Eye Contact: Good  Speech: Clear and Coherent  Speech Volume: Normal  Handedness:No data recorded  Mood and Affect  Mood: Euthymic  Duration of Depression Symptoms: No data recorded Affect: Appropriate   Thought Process  Thought Processes: Coherent; Linear  Descriptions of Associations:Intact  Orientation:Full (Time, Place and Person)  Thought Content:WDL  History of Schizophrenia/Schizoaffective disorder:Yes  Duration of Psychotic Symptoms:Greater than six months  Hallucinations:Hallucinations: None  Ideas of Reference:None  Suicidal Thoughts:Suicidal Thoughts: No  Homicidal Thoughts:Homicidal Thoughts: No   Sensorium  Memory: Immediate Good; Recent Good  Judgment: Fair  Insight: Fair   Art Therapist  Concentration: Fair  Attention Span: Fair  Recall: Good  Fund of Knowledge: Fair  Language: Fair   Psychomotor Activity  Psychomotor Activity: Psychomotor Activity: Normal   Assets  Assets: Communication Skills; Desire for Improvement; Housing; Social Support   Sleep  Sleep: Sleep: Good  Estimated Sleeping Duration (Last 24 Hours): 9.00-12.25 hours (Due to Daylight Saving Time, the durations displayed may not accurately represent documentation during the time change interval)  Physical Exam: Physical Exam ROS Blood pressure 103/67, pulse (!) 102, temperature 97.9 F (36.6 C), temperature source Oral, resp.  rate 14, height 5' 6 (1.676 m), weight 62.1 kg, SpO2 98%, unknown if currently breastfeeding. Body mass index is 22.11 kg/m.  Mental Status Per Nursing Assessment::   On Admission:  NA  Demographic Factors:  Caucasian and Low socioeconomic status  Loss Factors: Legal issues and Financial problems/change in socioeconomic status  Historical Factors: Impulsivity  Risk Reduction Factors:   Employed, Living with another person, especially a relative, and Positive social support  Continued Clinical Symptoms:  Alcohol/Substance Abuse/Dependencies  Cognitive Features That Contribute To Risk:  None    Suicide Risk:  Minimal: No identifiable suicidal ideation.  Patients presenting with no risk factors but with morbid ruminations; may be classified as minimal risk based on the severity of the depressive symptoms   Follow-up Information     [facility]   Why: Your therapy appointment is scheduled for Monday, 02/07/24 at 3 PM.  Your psychiatric appointment is scheduled for Thursday, 02/24/24 at 2:30 PM. Contact information: [address] [place] [address] [phone]                 Plan Of Care/Follow-up recommendations:  Activity:  as tolerated  Camelia LITTIE Lukes, PA-C 02/03/2024, 3:34 PM
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
## 2024-02-03 NOTE — Group Note (Signed)
 Date:  02/03/2024 Time:  8:53 PM  Group Topic/Focus:  Orientation:   The focus of this group is to educate the patient on the purpose and policies of crisis stabilization and provide a format to answer questions about their admission.  The group details unit policies and expectations of patients while admitted. Self Care:   The focus of this group is to help patients understand the importance of self-care in order to improve or restore emotional, physical, spiritual, interpersonal, and financial health.    Participation Level:  Active  Participation Quality:  Appropriate and Attentive  Affect:  Appropriate  Cognitive:  Alert and Appropriate  Insight: Appropriate and Good  Engagement in Group:  Improving  Modes of Intervention:  Clarification, Discussion, Education, Orientation, Rapport Building, and Support  Additional Comments:     Kenedi Cilia 02/03/2024, 8:53 PM

## 2024-02-04 NOTE — Progress Notes (Signed)
  [facility] Adult Case Management Discharge Plan :  Will you be returning to the same living situation after discharge:  Yes,  pt plans to return home upon discharge.  At discharge, do you have transportation home?: Yes,  pt husband to provide transportation. Do you have the ability to pay for your medications: Yes,  [facility] [employer].  Release of information consent forms completed and in the chart;  Patient's signature needed at discharge.  Patient to Follow up at:  Follow-up Information     [facility].   Why: Your therapy appointment is scheduled for Monday, 02/07/24 at 3 PM.  Your psychiatric appointment is scheduled for Thursday, 02/24/24 at 2:30 PM. Contact information: [address] [place] [address] [phone]                 Next level of care provider has access to [facility] Link:no  Safety Planning and Suicide Prevention discussed: Yes,  SPE completed with pt.      Has patient been referred to the Quitline?: Patient refused referral for treatment  Patient has been referred for addiction treatment: Yes, the patient will follow up with an outpatient provider for substance use disorder. Psychiatrist/APP: appointment made and Therapist: appointment made  Nadara JONELLE Fam, LCSW 02/04/2024, 9:29 AM

## 2024-02-04 NOTE — Group Note (Signed)
 Date:  02/04/2024 Time:  10:40 AM  Group Topic/Focus:  Dimensions of Wellness:   The focus of this group is to introduce the topic of wellness and discuss the role each dimension of wellness plays in total health.    Participation Level:  Active  Participation Quality:  Appropriate  Affect:  Appropriate  Cognitive:  Alert  Insight: Appropriate  Engagement in Group:  Engaged  Modes of Intervention:  Activity, Discussion, and Education  Additional Comments:    Skippy LITTIE Bennett 02/04/2024, 10:40 AM

## 2024-02-04 NOTE — Group Note (Signed)
 Recreation Therapy Group Note   Group Topic:Leisure Education  Group Date: 02/04/2024 Start Time: 1010 End Time: 1115 Facilitators: Celestia Jeoffrey BRAVO, LRT, CTRS Location: Craft Room  Group Description: Leisure. Patients were given the option to choose from journaling, coloring, drawing, making origami, playing with playdoh, listening to music or singing karaoke. LRT and pts discussed the meaning of leisure, the importance of participating in leisure during their free time/when they're outside of the hospital, as well as how our leisure interests can also serve as coping skills.   Goal Area(s) Addressed:  Patient will identify a current leisure interest.  Patient will learn the definition of "leisure". Patient will practice making a positive decision. Patient will have the opportunity to try a new leisure activity. Patient will communicate with peers and LRT.    Affect/Mood: Flat   Participation Level: Minimal    Clinical Observations/Individualized Feedback: Bailey Cabrera was in and out of group multiple times. Pt was noted to be asleep while sitting up.   Plan: Continue to engage patient in RT group sessions 2-3x/week.   Jeoffrey BRAVO Celestia, LRT, CTRS 02/04/2024 11:45 AM

## 2024-02-04 NOTE — Progress Notes (Signed)
   02/04/24 0840  Psych Admission Type (Psych Patients Only)  Admission Status Involuntary  Psychosocial Assessment  Patient Complaints None  Eye Contact Fair  Facial Expression Animated  Affect Appropriate to circumstance  Speech Logical/coherent  Interaction Assertive  Motor Activity Slow  Appearance/Hygiene Unremarkable  Behavior Characteristics Cooperative  Mood Pleasant  Thought Process  Coherency WDL  Content WDL  Delusions None reported or observed  Perception WDL  Hallucination None reported or observed  Judgment Poor  Confusion None  Danger to Self  Current suicidal ideation? Denies  Danger to Others  Danger to Others None reported or observed

## 2024-02-04 NOTE — Plan of Care (Signed)
 Upon assessment, Patient verbalized she was ready to be discharged and go back to work. She also stated  Not getting back on drug for sure Patient was seen interacting with peers in the Milieu. Patient was med compliant. Patient denies SI/HI. Patient contracted for safety verbally and no other issues reported or observed. Patient being discharged on 02/04/24.   Bailey Cabrera is a 33 y.o. female patient. No diagnosis found. Past Medical History:  Diagnosis Date   Anemia    Anxiety and depression    Asthma    Bipolar 1 disorder (HCC)    Drug use    Herpes genitalis 07/20/2014   Has been tx'd since 32weeks for suppresion   Hypotension    Marijuana use    Schizophrenia (HCC) 2022   diagnosed while in prision in 2022   Seizures (HCC)    During pregnancy; most recent April 2016   Current Facility-Administered Medications  Medication Dose Route Frequency Provider Last Rate Last Admin   acetaminophen  (TYLENOL ) tablet 650 mg  650 mg Oral Q6H PRN Smith, Annie B, NP   650 mg at 01/29/24 1127   alum & mag hydroxide-simeth (MAALOX/MYLANTA) 200-200-20 MG/5ML suspension 30 mL  30 mL Oral Q4H PRN Smith, Annie B, NP       buprenorphine -naloxone  (SUBOXONE ) 2-0.5 mg per SL tablet 2 tablet  2 tablet Sublingual TID Hunter, Crystal L, PA-C   2 tablet at 02/03/24 2057   haloperidol  (HALDOL ) tablet 5 mg  5 mg Oral TID PRN Smith, Annie B, NP   5 mg at 02/01/24 2252   And   diphenhydrAMINE  (BENADRYL ) capsule 50 mg  50 mg Oral TID PRN Smith, Annie B, NP   50 mg at 02/01/24 2252   haloperidol  lactate (HALDOL ) injection 5 mg  5 mg Intramuscular TID PRN Smith, Annie B, NP       And   diphenhydrAMINE  (BENADRYL ) injection 50 mg  50 mg Intramuscular TID PRN Smith, Annie B, NP       And   LORazepam  (ATIVAN ) injection 2 mg  2 mg Intramuscular TID PRN Smith, Annie B, NP       haloperidol  lactate (HALDOL ) injection 10 mg  10 mg Intramuscular TID PRN Smith, Annie B, NP   10 mg at 01/30/24 0018   And    diphenhydrAMINE  (BENADRYL ) injection 50 mg  50 mg Intramuscular TID PRN Smith, Annie B, NP   50 mg at 01/30/24 0019   And   LORazepam  (ATIVAN ) injection 2 mg  2 mg Intramuscular TID PRN Smith, Annie B, NP   2 mg at 01/30/24 0018   hydrOXYzine  (ATARAX ) tablet 25 mg  25 mg Oral TID PRN Smith, Annie B, NP   25 mg at 01/28/24 2112   magnesium  hydroxide (MILK OF MAGNESIA) suspension 30 mL  30 mL Oral Daily PRN Smith, Annie B, NP       nicotine  (NICODERM CQ  - dosed in mg/24 hours) patch 21 mg  21 mg Transdermal Daily Jadapalle, Sree, MD   21 mg at 02/03/24 1203   nicotine  polacrilex (NICORETTE ) gum 2 mg  2 mg Oral PRN Jadapalle, Sree, MD   2 mg at 01/28/24 1241   risperiDONE  (RISPERDAL ) tablet 1 mg  1 mg Oral QHS Jadapalle, Sree, MD   1 mg at 02/03/24 2138   traZODone  (DESYREL ) tablet 50 mg  50 mg Oral QHS PRN Smith, Annie B, NP       Allergies  Allergen Reactions   Penicillins Hives and  Swelling    Throat swells.   Amoxicillin Itching, Nausea Only and Rash   Sulfamethoxazole -Trimethoprim  Nausea Only   Principal Problem:   Substance-induced psychotic disorder [facility]) Active Problems:   Anxiety   Depression   Hypotension   Chest pain  Blood pressure 91/67, pulse 97, temperature 97.9 F (36.6 C), temperature source Oral, resp. rate 14, height 5' 6 (1.676 m), weight 62.1 kg, SpO2 99%, unknown if currently breastfeeding.   Bailey Cabrera B Bailey Cabrera 02/04/2024

## 2024-02-04 NOTE — Discharge Summary (Signed)
 Physician Discharge Summary Note  Patient:  Bailey Cabrera is an 33 y.o., female MRN:  [id number] DOB:  [birth year] Patient phone:  [phone] (home)  Patient address:   [address] [place] [address],   Total time spent: 40 min Date of Admission:  01/27/2024 Date of Discharge: 02/04/24  Reason for Admission:  Bailey Cabrera is a 33 y.o. female who presents to the emergency department under Involuntary Commitment (IVC) due to concerns for psychosis. The patient reports seeing her family being electrocuted and states she has a history of schizophrenia. She reports that her doctor started her on lithium  today. The patient denies suicidal ideation (SI) and homicidal ideation (HI). Patient is admitted to [facility]rera unit with Q15 min safety monitoring. Multidisciplinary team approach is offered. Medication management; group/milieu therapy is offered.   Principal Problem: Substance-induced psychotic disorder [facility]) Discharge Diagnoses: Principal Problem:   Substance-induced psychotic disorder (HCC) Active Problems:   Anxiety   Depression   Hypotension   Chest pain   Past Psychiatric History: see h&p  Family Psychiatric  History: see h&p Social History:  Social History   Substance and Sexual Activity  Alcohol Use No     Social History   Substance and Sexual Activity  Drug Use Yes   Types: Marijuana, Cocaine   Comment: Heroin last use 07/22/2019, marijuana last use 07/22/2019, meth last use 2 weeks ago    Social History   Socioeconomic History   Marital status: Married    Spouse name: Not on file   Number of children: 4   Years of education: Not on file   Highest education level: Not on file  Occupational History   Not on file  Tobacco Use   Smoking status: Every Day    Current packs/day: 1.50    Average packs/day: 1.5 packs/day for 5.0 years (7.5 ttl pk-yrs)    Types: Cigarettes   Smokeless tobacco: Never  Vaping Use   Vaping status: Never Used   Substance and Sexual Activity   Alcohol use: No   Drug use: Yes    Types: Marijuana, Cocaine    Comment: Heroin last use 07/22/2019, marijuana last use 07/22/2019, meth last use 2 weeks ago   Sexual activity: Not Currently    Birth control/protection: Surgical  Other Topics Concern   Not on file  Social History Narrative   Not on file   Social Drivers of Health   Financial Resource Strain: Low Risk (07/07/2022)   [facility]   Overall Financial Resource Strain (CARDIA)    Difficulty of Paying Living Expenses: Not hard at all  Food Insecurity: No Food Insecurity (01/28/2024)   Hunger Vital Sign    Worried About Running Out of Food in the Last Year: Never true    Ran Out of Food in the Last Year: Never true  Transportation Needs: No Transportation Needs (01/28/2024)   PRAPARE - Administrator, Civil Service (Medical): No    Lack of Transportation (Non-Medical): No  Physical Activity: Not on file  Stress: Not on file  Social Connections: Moderately Integrated (01/28/2024)   Social Connection and Isolation Panel    Frequency of Communication with Friends and Family: Twice a week    Frequency of Social Gatherings with Friends and Family: Twice a week    Attends Religious Services: 1 to 4 times per year    Active Member of [employer] or Organizations: Yes    Attends Banker Meetings: More than  4 times per year    Marital Status: Never married   Past Medical History:  Past Medical History:  Diagnosis Date   Anemia    Anxiety and depression    Asthma    Bipolar 1 disorder (HCC)    Drug use    Herpes genitalis 07/20/2014   Has been tx'd since 32weeks for suppresion   Hypotension    Marijuana use    Schizophrenia (HCC) 2022   diagnosed while in prision in 2022   Seizures (HCC)    During pregnancy; most recent April 2016    Past Surgical History:  Procedure Laterality Date   sonohystogram  2012   TUBAL LIGATION N/A 02/29/2016    Procedure: POST PARTUM TUBAL LIGATION;  Surgeon: Bailey JONETTA Mace, MD;  Location: [facility] ORS;  Service: Gynecology;  Laterality: N/A;   WISDOM TOOTH EXTRACTION Bilateral 2011   Post op infection   Family History: History reviewed. No pertinent family history.  Cabrera Course:   Bailey Cabrera is a 33 y.o. female who presents to the emergency department under Involuntary Commitment (IVC) due to concerns for psychosis. The patient reports seeing her family being electrocuted and states she has a history of schizophrenia. She reports that her doctor started her on lithium  today. The patient denies suicidal ideation (SI) and homicidal ideation (HI). Patient is admitted to [facility] Cabrera unit with Q15 min safety monitoring. Multidisciplinary team approach is offered. Medication management; group/milieu therapy is offered.   On admission,patient was on zyprexa  10 mg ; home suboxone . Patient reported lethargy on suboxone  tablets stating she tolerates suboxone  strips better which were not available inpatient.Discontinued Zyprexa  restarted risperidone  3 mg nightly this is their home medication.buprenorphine -naloxone  (SUBOXONE ) 2-0.5 mg per SL tablet 2 tablets. Prazosin and Paxil were not restarted as patient reported lethargy. Patient tolerated the medication changes fairly well and during her stay her mood improved, no psychosis was noted and maintained safe behaviors on the unit.  Detailed risk assessment is complete based on clinical exam and individual risk factors and acute suicide risk is low and acute violence risk is low.    On the day of discharge, patient denies SI/HI/plan and denies hallucinations.  Patient remains future oriented and is willing to participate in outpatient mental health services.  Currently, all modifiable risk of harm to self/harm to others have been addressed and patient is no longer appropriate for the acute inpatient setting and is able to continue treatment for mental health  needs in the community with the supports as indicated below.  Patient is educated and verbalized understanding of discharge plan of care including medications, follow-up appointments, mental health resources and further crisis services in the community.  He is instructed to call 911 or present to the nearest emergency room should he experience any decompensation in mood, disturbance of bowel or return of suicidal/homicidal ideations.  Patient verbalizes understanding of this education and agrees to this plan of care  Physical Findings: AIMS:  , ,  ,  ,    CIWA:    COWS:      Psychiatric Specialty Exam:  Presentation  General Appearance:  Appropriate for Environment; Casual  Eye Contact: Fair  Speech: Clear and Coherent  Speech Volume: Normal    Mood and Affect  Mood: Euthymic  Affect: Appropriate   Thought Process  Thought Processes: Coherent  Descriptions of Associations:Intact  Orientation:Full (Time, Place and Person)  Thought Content:Logical  Hallucinations:Hallucinations: None  Ideas of Reference:None  Suicidal Thoughts:Suicidal Thoughts: No  Homicidal Thoughts:Homicidal Thoughts: No   Sensorium  Memory: Immediate Fair; Recent Fair  Judgment: Fair  Insight: Fair   Chartered Certified Accountant: Fair  Attention Span: Fair  Recall: [employer] of Knowledge: Fair  Language: Fair   Psychomotor Activity  Psychomotor Activity: Psychomotor Activity: Normal  Musculoskeletal: Strength & Muscle Tone: within normal limits Gait & Station: normal Assets  Assets: Manufacturing Systems Engineer; Desire for Improvement; Social Support   Sleep  Sleep: Sleep: Fair    Physical Exam: Physical Exam Vitals and nursing note reviewed.    ROS Blood pressure (!) 89/70, pulse (!) 109, temperature 97.6 F (36.4 C), temperature source Oral, resp. rate 16, height 5' 6 (1.676 m), weight 62.1 kg, SpO2 98%, unknown if currently breastfeeding.  Body mass index is 22.11 kg/m.   Social History   Tobacco Use  Smoking Status Every Day   Current packs/day: 1.50   Average packs/day: 1.5 packs/day for 5.0 years (7.5 ttl pk-yrs)   Types: Cigarettes  Smokeless Tobacco Never   Tobacco Cessation:  N/A, patient does not currently use tobacco products   Blood Alcohol level:  Lab Results  Component Value Date   [facility] <15 01/26/2024   ETH <10 12/07/2021    Metabolic Disorder Labs:  Lab Results  Component Value Date   HGBA1C 4.8 01/28/2024   MPG 91.06 01/28/2024   No results found for: PROLACTIN Lab Results  Component Value Date   CHOL 131 01/28/2024   TRIG 52 01/28/2024   HDL 36 (L) 01/28/2024   CHOLHDL 3.6 01/28/2024   VLDL 10 01/28/2024   LDLCALC 84 01/28/2024    See Psychiatric Specialty Exam and Suicide Risk Assessment completed by Attending Physician prior to discharge.  Discharge destination:  Home  Is patient on multiple antipsychotic therapies at discharge:  No   Has Patient had three or more failed trials of antipsychotic monotherapy by history:  No  Recommended Plan for Multiple Antipsychotic Therapies: NA  Discharge Instructions     Diet - low sodium heart healthy   Complete by: As directed    Increase activity slowly   Complete by: As directed       Allergies as of 02/04/2024       Reactions   Penicillins Hives, Swelling   Throat swells.   Amoxicillin Itching, Nausea Only, Rash   Sulfamethoxazole -trimethoprim  Nausea Only        Medication List     STOP taking these medications    desvenlafaxine 25 MG 24 hr tablet Commonly known as: PRISTIQ   PARoxetine 20 MG tablet Commonly known as: PAXIL   prazosin 2 MG capsule Commonly known as: MINIPRESS       TAKE these medications      Indication  naloxone  4 MG/0.1ML Liqd nasal spray kit Commonly known as: NARCAN  Spray intranasally for overdose  Indication: Opioid Overdose   risperiDONE  1 MG tablet Commonly known as:  RISPERDAL  Take 1 tablet (1 mg total) by mouth at bedtime. What changed:  medication strength how much to take  Indication: Mood disorder   Suboxone  8-2 MG Film Generic drug: Buprenorphine  HCl-Naloxone  HCl Place 1 Film under the tongue 3 (three) times daily.  Indication: Opioid Dependence        Follow-up Information     [facility]   Why: Your therapy appointment is scheduled for Monday, 02/07/24 at 3 PM.  Your psychiatric appointment is scheduled for Thursday, 02/24/24 at 2:30 PM. Contact information: [address] [place] [address]  [phone]                 Follow-up recommendations:  Activity:  as tolerated    Signed: Kimyata Milich, MD 02/04/2024, 3:51 PM

## 2024-02-04 NOTE — Progress Notes (Signed)
 Patient ID: Bailey Cabrera, female   DOB: 09/24/1990, 33 y.o.   MRN: [id number]  Patient discharged with husband. Discharge paperwork supplied with education and patient verbalized understanding. Patient satisfied with property returned. Denies SI/HI/AVH. No distress noted.

## 2024-02-05 NOTE — Progress Notes (Unsigned)
 Patient ID: Bailey Cabrera, female   DOB: [birth year], 33 y.o.   MRN: [id number]  On 02/05/2024, BM[facility] staff received a call informing us  that there is a flag on this patient's chart due to Suboxone  not being reviewed. This RN was able to located 4mg  (two 2mg  tablets) of Buprenorphine  and Naloxone  in this patient's bin that was used while she was inpatient. Patient was already discharged therefore this RN was unable to return the medication that was not returned by the previous RN. This clinical research associate returned the medication to [facility] at [facility] pharmacy on 02/05/2024 at 1825. Pharmacy states that they are unable to link the med return to the patient therefore, the flag will not be removed. RN notified nurse manager of this occurrence via email.

## 2024-02-19 ENCOUNTER — Emergency Department
Admit: 2024-02-19 | Discharge: 2024-02-19 | Disposition: A | Discharge status: Home / Self Care | Payer: Medicaid (Managed Care)

## 2024-03-19 ENCOUNTER — Emergency Department
Admit: 2024-03-19 | Discharge: 2024-03-19 | Disposition: A | Discharge status: Home / Self Care | Payer: Medicare (Managed Care)

## 2024-04-03 ENCOUNTER — Other Ambulatory Visit: Payer: Self-pay

## 2024-04-03 ENCOUNTER — Emergency Department
Admission: EM | Admit: 2024-04-03 | Discharge: 2024-04-03 | Disposition: A | Discharge status: Home / Self Care | Payer: MEDICAID | Attending: Emergency Medicine | Admitting: Emergency Medicine

## 2024-04-03 DIAGNOSIS — R0981 Nasal congestion: Secondary | ICD-10-CM | POA: Diagnosis present

## 2024-04-03 DIAGNOSIS — J069 Acute upper respiratory infection, unspecified: Secondary | ICD-10-CM | POA: Diagnosis not present

## 2024-04-03 DIAGNOSIS — R441 Visual hallucinations: Secondary | ICD-10-CM | POA: Insufficient documentation

## 2024-04-03 MED ORDER — LORAZEPAM 1 MG PO TABS
1.0000 mg | ORAL_TABLET | Freq: Once | ORAL | Status: AC
  Administered 2024-04-03: 1 mg via ORAL
  Filled 2024-04-03: qty 1

## 2024-04-03 MED ORDER — RISPERIDONE 1 MG PO TABS
2.0000 mg | ORAL_TABLET | Freq: Once | ORAL | Status: AC
  Administered 2024-04-03: 2 mg via ORAL
  Filled 2024-04-03: qty 2

## 2024-04-03 NOTE — ED Triage Notes (Addendum)
 Pt reports she has hx schizophrenia, pt was put on a new med 2 months ago and pt reports she doesn't feel like it is working because she has been having visual and auditory hallucinations. Pt reports feeling anxious. Pt denies SI. Pt reports she just wants to be seen for med change. Explained to pt process of dressing out and getting blood work to med clear pt to speak with psych  and pt reports she just wants to go to [facility] tomorrow but while she is here wants to be seen for cough congestion.

## 2024-04-03 NOTE — ED Provider Notes (Signed)
 "  [facility] Provider Note    Event Date/Time   First MD Initiated Contact with Patient 04/03/24 2110     (approximate)   History   Hallucinations and Nasal Congestion   HPI  Bailey Cabrera is a 34 y.o. female who presents to the ED for evaluation of Hallucinations and Nasal Congestion   Patient with history of schizophrenia presents to the ED with 1 week of upper respiratory congestion and chronic visual hallucinations.  Reports compliance with her antipsychotic regimen but no improvement despite months of adherence.  Denies SI, HI, command auditory destinations.  Physical Exam   Triage Vital Signs: ED Triage Vitals  Encounter Vitals Group     BP 04/03/24 2043 (!) 134/90     Girls Systolic BP Percentile --      Girls Diastolic BP Percentile --      Boys Systolic BP Percentile --      Boys Diastolic BP Percentile --      Pulse Rate 04/03/24 2043 (!) 110     Resp 04/03/24 2043 20     Temp 04/03/24 2043 98.1 F (36.7 C)     Temp src --      SpO2 04/03/24 2043 100 %     Weight 04/03/24 2039 143 lb (64.9 kg)     Height 04/03/24 2039 5' 3 (1.6 m)     Head Circumference --      Peak Flow --      Pain Score 04/03/24 2039 0     Pain Loc --      Pain Education --      Exclude from Growth Chart --     Most recent vital signs: Vitals:   04/03/24 2043  BP: (!) 134/90  Pulse: (!) 110  Resp: 20  Temp: 98.1 F (36.7 C)  SpO2: 100%    General: Awake, no distress.  Upper respiratory congestion.  Generally well-appearing CV:  Good peripheral perfusion.  Resp:  Normal effort.  Clear lungs Abd:  No distention.  MSK:  No deformity noted.  Neuro:  No focal deficits appreciated. Other:     ED Results / Procedures / Treatments   Labs (all labs ordered are listed, but only abnormal results are displayed) Labs Reviewed - No data to display  EKG   RADIOLOGY   Official radiology report(s): No results found.  PROCEDURES and  INTERVENTIONS:  Procedures  Medications  risperiDONE  (RISPERDAL ) tablet 2 mg (has no administration in time range)  LORazepam  (ATIVAN ) tablet 1 mg (has no administration in time range)     IMPRESSION / MDM / ASSESSMENT AND PLAN / ED COURSE  I reviewed the triage vital signs and the nursing notes.  Differential diagnosis includes, but is not limited to, viral URI, decompensated psychiatric illness, medication noncompliance, polysubstance abuse  {Patient presents with symptoms of an acute illness or injury that is potentially life-threatening.  Patient with history of  schizophrenia chronically sedations presents with chronic symptoms and an acute viral URI suitable for outpatient management.  Reports chronically sedations and poor sleep.  No suicidal thoughts or signs of overdose.  I offer the patient psychiatric evaluation but she prefers to leave because she has to go to work tomorrow and reports that she can go to [facility] for medication adjustment.  No indications for antibiotics or IVC.     FINAL CLINICAL IMPRESSION(S) / ED DIAGNOSES   Final diagnoses:  Viral URI with cough  Visual hallucinations  Rx / DC Orders   ED Discharge Orders     None        Note:  This document was prepared using Dragon voice recognition software and may include unintentional dictation errors.   Claudene Rover, MD 04/03/24 2235  "

## 2024-04-08 ENCOUNTER — Emergency Department
Admission: EM | Admit: 2024-04-08 | Discharge: 2024-04-08 | Disposition: A | Discharge status: Home / Self Care | Payer: MEDICAID | Attending: Emergency Medicine | Admitting: Emergency Medicine

## 2024-04-08 ENCOUNTER — Other Ambulatory Visit: Payer: Self-pay

## 2024-04-08 ENCOUNTER — Emergency Department
Admission: EM | Admit: 2024-04-08 | Discharge: 2024-04-11 | Disposition: A | Discharge status: Other Acute Inpatient Hospital | Payer: MEDICAID | Source: Home / Self Care | Attending: Emergency Medicine | Admitting: Emergency Medicine

## 2024-04-08 DIAGNOSIS — Z79899 Other long term (current) drug therapy: Secondary | ICD-10-CM | POA: Insufficient documentation

## 2024-04-08 DIAGNOSIS — F1990 Other psychoactive substance use, unspecified, uncomplicated: Secondary | ICD-10-CM

## 2024-04-08 DIAGNOSIS — F1490 Cocaine use, unspecified, uncomplicated: Secondary | ICD-10-CM | POA: Insufficient documentation

## 2024-04-08 DIAGNOSIS — F29 Unspecified psychosis not due to a substance or known physiological condition: Secondary | ICD-10-CM | POA: Insufficient documentation

## 2024-04-08 DIAGNOSIS — F1590 Other stimulant use, unspecified, uncomplicated: Secondary | ICD-10-CM | POA: Insufficient documentation

## 2024-04-08 DIAGNOSIS — F1910 Other psychoactive substance abuse, uncomplicated: Secondary | ICD-10-CM | POA: Insufficient documentation

## 2024-04-08 DIAGNOSIS — F419 Anxiety disorder, unspecified: Secondary | ICD-10-CM | POA: Insufficient documentation

## 2024-04-08 DIAGNOSIS — J45909 Unspecified asthma, uncomplicated: Secondary | ICD-10-CM | POA: Insufficient documentation

## 2024-04-08 DIAGNOSIS — F22 Delusional disorders: Secondary | ICD-10-CM | POA: Diagnosis not present

## 2024-04-08 DIAGNOSIS — F209 Schizophrenia, unspecified: Secondary | ICD-10-CM

## 2024-04-08 LAB — COMPREHENSIVE METABOLIC PANEL WITH GFR
ALT: 8 U/L (ref 0–44)
ALT: 9 U/L (ref 0–44)
AST: 14 U/L — ABNORMAL LOW (ref 15–41)
AST: 15 U/L (ref 15–41)
Albumin: 4.6 g/dL (ref 3.5–5.0)
Albumin: 4.8 g/dL (ref 3.5–5.0)
Alkaline Phosphatase: 62 U/L (ref 38–126)
Alkaline Phosphatase: 67 U/L (ref 38–126)
Anion gap: 12 (ref 5–15)
Anion gap: 12 (ref 5–15)
BUN: 6 mg/dL (ref 6–20)
BUN: <5 mg/dL — ABNORMAL LOW (ref 6–20)
CO2: 25 mmol/L (ref 22–32)
CO2: 25 mmol/L (ref 22–32)
Calcium: 9.1 mg/dL (ref 8.9–10.3)
Calcium: 9.5 mg/dL (ref 8.9–10.3)
Chloride: 102 mmol/L (ref 98–111)
Chloride: 101 mmol/L (ref 98–111)
Creatinine, Ser: 0.59 mg/dL (ref 0.44–1.00)
Creatinine, Ser: 0.6 mg/dL (ref 0.44–1.00)
GFR, Estimated: >60 mL/min
GFR, Estimated: >60 mL/min
Glucose, Bld: 108 mg/dL — ABNORMAL HIGH (ref 70–99)
Glucose, Bld: 97 mg/dL (ref 70–99)
Potassium: 3.8 mmol/L (ref 3.5–5.1)
Potassium: 3.3 mmol/L — ABNORMAL LOW (ref 3.5–5.1)
Sodium: 138 mmol/L (ref 135–145)
Sodium: 137 mmol/L (ref 135–145)
Total Bilirubin: 0.3 mg/dL (ref 0.0–1.2)
Total Bilirubin: 0.4 mg/dL (ref 0.0–1.2)
Total Protein: 7.4 g/dL (ref 6.5–8.1)
Total Protein: 8 g/dL (ref 6.5–8.1)

## 2024-04-08 LAB — CBC
HCT: 38.2 % (ref 36.0–46.0)
HCT: 39.2 % (ref 36.0–46.0)
Hemoglobin: 13.1 g/dL (ref 12.0–15.0)
Hemoglobin: 13.5 g/dL (ref 12.0–15.0)
MCH: 31.4 pg (ref 26.0–34.0)
MCH: 31.3 pg (ref 26.0–34.0)
MCHC: 34.3 g/dL (ref 30.0–36.0)
MCHC: 34.4 g/dL (ref 30.0–36.0)
MCV: 91.6 fL (ref 80.0–100.0)
MCV: 90.7 fL (ref 80.0–100.0)
Platelets: 257 10*3/uL (ref 150–400)
Platelets: 270 10*3/uL (ref 150–400)
RBC: 4.17 MIL/uL (ref 3.87–5.11)
RBC: 4.32 MIL/uL (ref 3.87–5.11)
RDW: 12.1 % (ref 11.5–15.5)
RDW: 12.1 % (ref 11.5–15.5)
WBC: 9 10*3/uL (ref 4.0–10.5)
WBC: 8.2 10*3/uL (ref 4.0–10.5)
nRBC: 0 % (ref 0.0–0.2)
nRBC: 0 % (ref 0.0–0.2)

## 2024-04-08 LAB — URINE DRUG SCREEN
Amphetamines: POSITIVE — AB
Barbiturates: NEGATIVE
Benzodiazepines: NEGATIVE
Cocaine: POSITIVE — AB
Fentanyl: NEGATIVE
Methadone Scn, Ur: NEGATIVE
Opiates: NEGATIVE
Tetrahydrocannabinol: NEGATIVE

## 2024-04-08 LAB — POC URINE PREG, ED: Preg Test, Ur: NEGATIVE

## 2024-04-08 LAB — ETHANOL
Alcohol, Ethyl (B): <15 mg/dL
Alcohol, Ethyl (B): <15 mg/dL

## 2024-04-08 MED ORDER — LORAZEPAM 2 MG/ML IJ SOLN
2.0000 mg | Freq: Once | INTRAMUSCULAR | Status: AC
  Administered 2024-04-08: 2 mg via INTRAVENOUS
  Filled 2024-04-08: qty 1

## 2024-04-08 MED ORDER — BUPRENORPHINE HCL-NALOXONE HCL 8-2 MG SL SUBL: 1.0000 | SUBLINGUAL_TABLET | Freq: Three times a day (TID) | SUBLINGUAL | Status: DC

## 2024-04-08 MED ORDER — HALOPERIDOL LACTATE 5 MG/ML IJ SOLN
5.0000 mg | Freq: Once | INTRAMUSCULAR | Status: AC
  Administered 2024-04-08: 5 mg via INTRAMUSCULAR
  Filled 2024-04-08: qty 1

## 2024-04-08 MED ORDER — ZIPRASIDONE MESYLATE 20 MG IM SOLR
20.0000 mg | Freq: Once | INTRAMUSCULAR | Status: AC
  Administered 2024-04-08: 20 mg via INTRAMUSCULAR
  Filled 2024-04-08: qty 20

## 2024-04-08 MED ORDER — OLANZAPINE 5 MG PO TABS
5.0000 mg | ORAL_TABLET | Freq: Two times a day (BID) | ORAL | Status: DC
  Administered 2024-04-09 – 2024-04-11 (×5): 5 mg via ORAL
  Filled 2024-04-08 (×6): qty 1

## 2024-04-08 MED ORDER — RISPERIDONE 1 MG PO TABS: 1.0000 mg | ORAL_TABLET | Freq: Every day | ORAL | Status: DC

## 2024-04-08 NOTE — ED Notes (Signed)
 Pt requested to use phone. Pt informed that she would have to wait until allotted phone times to make call. Patient threatened to leave if she could not use it. MD Ward made aware. Since pt is not under IVC, patient informed that she has the right to leave if she wishes or she could stay and receive help.

## 2024-04-08 NOTE — ED Notes (Signed)
 Pt reporting to ED for psych eval, reporting that she is hearing voices and seeing things. Pt states that they are trying to kill us . Pt says she doesn't think her current psych meds are working. Pt also stated that her children have been abducted and tortured for 3 years, so she needs to get on the right meds to help out. Pt denies SI/HI. Pt ABCs intact. RR even and unlabored. Pt in NAD.   Past Medical History:  Diagnosis Date   Anemia    Anxiety and depression    Asthma    Bipolar 1 disorder (HCC)    Drug use    Herpes genitalis 07/20/2014   Has been tx'd since 32weeks for suppresion   Hypotension    Marijuana use    Schizophrenia (HCC) 2022   diagnosed while in prision in 2022   Seizures (HCC)    During pregnancy; most recent April 2016

## 2024-04-08 NOTE — ED Triage Notes (Signed)
 Patient brought in by husband for voluntary psych evaluation. Patient reports visual and auditory hallucinations, states she's seeing family getting abducted and tortured and the fake people talking to her. Patient calm and cooperative in triage. Hx schizophrenia, denies SI/HI. Patient reports cocaine and meth use.
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
## 2024-04-08 NOTE — BH Assessment (Signed)
 Per [facility] Gennette H.), patient to be referred out of system.  Referral information for Psychiatric Hospitalization faxed to;   [facility] [phone]- [phone]), no appropriate bed   Service Provider Phone  [facility]  [phone]  [facility]  [phone]  [facility]  [phone]  [facility]  [phone]  [facility]  [phone]  [facility]  [phone]  [facility]  [phone]  [facility]  [phone]  [facility]  [phone]  [facility]  [phone]  [facility]  [phone]  [place] EFAX  [phone]  [facility]  [phone]  [facility]  [phone]  [facility]  [phone]
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
## 2024-04-08 NOTE — ED Notes (Signed)
 Mother Mar Mussel), phone [phone].

## 2024-04-08 NOTE — ED Notes (Addendum)
 Pt left AMA. Pt unwilling to wait for phone times to call family. Patient provided with all belongings and escorted to guest bathroom to change into clothes. After changing and using restroom, pt left ED.

## 2024-04-08 NOTE — ED Notes (Signed)
 Pt is calm and asleep, no needs at this time
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
## 2024-04-08 NOTE — ED Notes (Signed)
 Even rise and fall of chest noted. Pt is asleep.
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
## 2024-04-08 NOTE — ED Triage Notes (Signed)
 Pt in voluntarily with request for psychiatric help. Reports hx of schizophrenia and says she has been hearing voices. Pt states she was over at [facility], but says she could not further trx due to scabies. Pt mentions she has been on a medication for schizophrenia x 4mos and it is not effective. Denies any SI/HI at this time.

## 2024-04-08 NOTE — ED Notes (Signed)
 Patient has been accepted to [facility].  Patient assigned to [facility] Accepting physician is Dr. Odella Jumper.  Call report to [phone].  Representative was [employer].   ER Staff is aware of it:  Melody ER Secretary  Dr. Jossie, ER MD  [facility] Patient's Nurse

## 2024-04-08 NOTE — ED Provider Notes (Addendum)
 "  [facility] Provider Note    Event Date/Time   First MD Initiated Contact with Patient 04/08/24 [id number]     (approximate)   History   Psychiatric Evaluation   HPI  Bailey Cabrera is a 34 y.o. female with history of schizophrenia, polysubstance use disorder, bipolar disorder who presents to the emergency department with paranoia.  States she is hearing voices, seeing children get hurt.  She states she is scared for her life.  She denies SI or HI.  Was seen at [facility] and has multiple skin lesions likely from history of cocaine and methamphetamine use.  [facility] was concerned she had scabies and sent her to the emergency department.  She states she has had this rash for a long time.  No one else in her home has a similar rash.  It is pruritic in nature.  It involves her hands, feet but also multiple lesions to her face.  No new exposures.  Patient also states that she is seeing pinworms coming out of her nose.  She wants to be tested for that as well.   History provided by patient.    Past Medical History:  Diagnosis Date   Anemia    Anxiety and depression    Asthma    Bipolar 1 disorder (HCC)    Drug use    Herpes genitalis 07/20/2014   Has been tx'd since 32weeks for suppresion   Hypotension    Marijuana use    Schizophrenia (HCC) 2022   diagnosed while in prision in 2022   Seizures (HCC)    During pregnancy; most recent April 2016    Past Surgical History:  Procedure Laterality Date   sonohystogram  2012   TUBAL LIGATION N/A 02/29/2016   Procedure: POST PARTUM TUBAL LIGATION;  Surgeon: Garnette JONETTA Mace, MD;  Location: [facility] ORS;  Service: Gynecology;  Laterality: N/A;   WISDOM TOOTH EXTRACTION Bilateral 2011   Post op infection    MEDICATIONS:  Prior to Admission medications  Medication Sig Start Date End Date Taking? Authorizing Provider  naloxone  (NARCAN ) nasal spray 4 mg/0.1 mL Spray intranasally for overdose 06/19/19   Willo Dunnings, MD   risperiDONE  (RISPERDAL ) 1 MG tablet Take 1 tablet (1 mg total) by mouth at bedtime. 02/03/24   Hunter, Crystal L, PA-C  SUBOXONE  8-2 MG FILM Place 1 Film under the tongue 3 (three) times Cabrera.    [provider]  albuterol  (PROVENTIL  HFA;VENTOLIN  HFA) 108 (90 BASE) MCG/ACT inhaler Inhale 2 puffs into the lungs every 4 (four) hours as needed for wheezing or shortness of breath. 11/27/14 06/19/15  Servando Hire, MD    Physical Exam   Triage Vital Signs: ED Triage Vitals [04/08/24 0422]  Encounter Vitals Group     BP (!) 150/97     Girls Systolic BP Percentile      Girls Diastolic BP Percentile      Boys Systolic BP Percentile      Boys Diastolic BP Percentile      Pulse Rate 88     Resp 18     Temp 98.6 F (37 C)     Temp Source Oral     SpO2 99 %     Weight 145 lb (65.8 kg)     Height 5' 3 (1.6 m)     Head Circumference      Peak Flow      Pain Score 0     Pain Loc  Pain Education      Exclude from Growth Chart     Most recent vital signs: Vitals:   04/08/24 0422  BP: (!) 150/97  Pulse: 88  Resp: 18  Temp: 98.6 F (37 C)  SpO2: 99%    CONSTITUTIONAL: Alert, responds appropriately to questions.  Chronically ill-appearing HEAD: Normocephalic, atraumatic EYES: Conjunctivae clear, pupils appear equal, sclera nonicteric ENT: normal nose; moist mucous membranes, no lesions noted to the nose and no sign of any parasites NECK: Supple, normal ROM CARD: RRR; S1 and S2 appreciated RESP: Normal chest excursion without splinting or tachypnea; breath sounds clear and equal bilaterally; no wheezes, no rhonchi, no rales, no hypoxia or respiratory distress, speaking full sentences ABD/GI: Non-distended; soft, non-tender, no rebound, no guarding, no peritoneal signs BACK: The back appears normal EXT: Normal ROM in all joints; no deformity noted, no edema SKIN: Normal color for age and race; warm; multiple areas of what appears to be skin picking to the face and  extremities, no sign of scabies or any burrowing between the fingers or toes no sign of tinea, no sign of cellulitis.  No petechia or purpura.  No blisters or desquamation.  No urticaria. NEURO: Moves all extremities equally, normal speech PSYCH: The patient's mood and manner are appropriate.   ED Results / Procedures / Treatments   LABS: (all labs ordered are listed, but only abnormal results are displayed) Labs Reviewed  COMPREHENSIVE METABOLIC PANEL WITH GFR - Abnormal; Notable for the following components:      Result Value   Glucose, Bld 108 (*)    AST 14 (*)    All other components within normal limits  URINE DRUG SCREEN - Abnormal; Notable for the following components:   Cocaine POSITIVE (*)    Amphetamines POSITIVE (*)    All other components within normal limits  ETHANOL  CBC  POC URINE PREG, ED     EKG:  EKG Interpretation Date/Time:    Ventricular Rate:    PR Interval:    QRS Duration:    QT Interval:    QTC Calculation:   R Axis:      Text Interpretation:           RADIOLOGY: My personal review and interpretation of imaging:    I have personally reviewed all radiology reports.   No results found.   PROCEDURES:  Critical Care performed: No     Procedures    IMPRESSION / MDM / ASSESSMENT AND PLAN / ED COURSE  I reviewed the triage vital signs and the nursing notes.    Patient here with paranoia, psychosis.  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     DIFFERENTIAL DIAGNOSIS (includes but not limited to):   Suspect substance-induced psychosis but differential also includes psychosis due to schizophrenia or bipolar disorder.  Also suspect delusional parasitosis.  Doubt actual parasite infection of her nose.  No sign of scabies on exam.  Skin lesions appear secondary to substance use disorder, skin picking.  No signs of cellulitis.  Doubt SJS, TE N, allergic reaction.   Patient's presentation is most consistent with acute  presentation with potential threat to life or bodily function.   PLAN: Will obtain screening labs, urine.  Will consult psychiatry and TTS.   The patient has been placed in psychiatric observation due to the need to provide a safe environment for the patient while obtaining psychiatric consultation and evaluation, as well as ongoing medical and medication management to treat the patient's condition.  The patient has not been placed under full IVC at this time.    MEDICATIONS GIVEN IN ED: Medications  risperiDONE  (RISPERDAL ) tablet 1 mg (has no administration in time range)  buprenorphine -naloxone  (SUBOXONE ) 8-2 mg per SL tablet 1 tablet (has no administration in time range)     ED COURSE: 6:11 AM  Labs show no significant abnormality.  Normal electrolytes, hemoglobin.  Pregnancy test negative.  Drug screen is positive for cocaine and amphetamines.  Medically cleared at this time for psychiatric disposition.  7:00 AM  Pt's nurse contacted me that patient states that if she does not get the phone to call her family that she is going to leave.  At this time I do not feel she is a danger to herself or others.  She denies SI or HI and is not responding to internal stimuli.  I suspect that her paranoia and psychosis is secondary to her polysubstance use disorder.  I do not feel that I have any indication to involuntarily commit her at this time.  Patient can follow-up with her outpatient providers and continue her medications as prescribed.   CONSULTS: TTS and psychiatry consulted for further disposition.   OUTSIDE RECORDS REVIEWED: Reviewed recent behavioral health notes.  Patient was last admitted in November 2025.       FINAL CLINICAL IMPRESSION(S) / ED DIAGNOSES   Final diagnoses:  Psychosis, unspecified psychosis type (HCC)  Polysubstance use disorder  Delusions of parasitosis (HCC)     Rx / DC Orders   ED Discharge Orders     None        Note:  This document was  prepared using Dragon voice recognition software and may include unintentional dictation errors.   Liah Morr, Josette SAILOR, DO 04/08/24 [id number]    Lujain Kraszewski, Josette SAILOR, DO 04/08/24 [phone]  "

## 2024-04-08 NOTE — ED Notes (Signed)
 Patient is IVC pending inpatient treatment

## 2024-04-08 NOTE — ED Notes (Signed)
 Patient Belongings:  Black shoes, green socks, black stripped underwear, grey bra, jeans, black wingstop shirt, black long sleeve shirt, black jacket, charging cord and port, prazosin prescription bottle, clotrimazole cream, fixodent, desvenlafaxine prescription bottle, risperidone  prescription bottle, prednisone prescription bottle, necklace with metal ring, green silicone ring, purple silicone ring, four stone engagement ring, metal cross, phone with purple case, pink vape, green vape, cigarettes in fabric skull case.  Given to security officer:  Black knife.

## 2024-04-08 NOTE — ED Provider Notes (Signed)
 "  [facility] Provider Note    Event Date/Time   First MD Initiated Contact with Patient 04/08/24 1018     (approximate)  History   Chief Complaint: Mental Health Problem  HPI  ZILPHA MCANDREW is a 34 y.o. female with a past medical history of bipolar, schizophrenia, presents to the emergency department for altered mental status/psychosis.  Patient was seen in the emergency department yesterday ultimately left prior to being seen by psychiatry TTS, at that time did not meet IVC criteria.  Patient apparently did not leave and instead checked by again saying that she is seeing and hearing things.  States that she is hearing her husband's voice even in the emergency department.  Patient has erratic behaviors in the emergency department.  Patient is very anxious standing up and pacing at times and sitting back down.  Continues to states she is leaving.  Patient states her medications are not working.  During my evaluation patient states her husband is trying to tell us  things about her, the husband is not here.  Physical Exam   Triage Vital Signs: ED Triage Vitals  Encounter Vitals Group     BP 04/08/24 1003 136/89     Girls Systolic BP Percentile --      Girls Diastolic BP Percentile --      Boys Systolic BP Percentile --      Boys Diastolic BP Percentile --      Pulse Rate 04/08/24 1003 83     Resp 04/08/24 1003 16     Temp 04/08/24 1003 98.2 F (36.8 C)     Temp Source 04/08/24 1003 Oral     SpO2 04/08/24 1003 98 %     Weight 04/08/24 1005 140 lb (63.5 kg)     Height 04/08/24 1005 5' 3 (1.6 m)     Head Circumference --      Peak Flow --      Pain Score 04/08/24 1003 0     Pain Loc --      Pain Education --      Exclude from Growth Chart --     Most recent vital signs: Vitals:   04/08/24 1003  BP: 136/89  Pulse: 83  Resp: 16  Temp: 98.2 F (36.8 C)  SpO2: 98%    General: Awake, no distress.  Erratic behaviors.  Appears very anxious,  pacing. CV:  Good peripheral perfusion.  Regular rate and rhythm  Resp:  Normal effort.  Equal breath sounds bilaterally.  Abd:  No distention.  Soft, nontender.  No rebound or guarding. Other:  Scabbing/picking wounds over her face.   ED Results / Procedures / Treatments   MEDICATIONS ORDERED IN ED: Medications - No data to display   IMPRESSION / MDM / ASSESSMENT AND PLAN / ED COURSE  I reviewed the triage vital signs and the nursing notes.  Patient's presentation is most consistent with acute presentation with potential threat to life or bodily function.  Patient presents to the emergency department for altered mental status/psychosis.  Patient states she is seeing and hearing things needs her medications adjusted.  Patient somewhat erratic in the emergency department stands up will pace and sit back down, states she is leaving and is not going to stay here.  Patient during my evaluation states that her husband is trying to tell me things, she clarifies that she can hear his voice right now.  The husband is not here.  Patient denies any drugs or  alcohol use.  Denies any medical complaints.  As the patient was seen last night and left prior to evaluation now with worsening psychosis and erratic behavior will place under an IVC until psychiatry TTS can evaluate.  Patient has become increasingly agitated.  Psychiatry is seen they will admit for inpatient treatment.  Patient CBC is normal chemistry is normal ethanol negative.  Drug screen is pending.  Given the patient's increasing agitation not responding to verbal redirection we will dose 5 mg of Haldol  2 mg of Ativan  IM.  Patient has become agitated once again has thrown her couple of water at the wall and the bed.  Yelling loudly and refusing verbal redirection.  Will dose 20 mg of IM Geodon .  Patient's medical workup has been reassuring awaiting psychiatric inpatient placement.  ----------------------------------------- 1:51 PM on  04/08/2024 ----------------------------------------- Patient is much more calm after IM medication.  Will continue to closely monitor the patient.  InsertFINAL CLINICAL IMPRESSION(S) / ED DIAGNOSES   Psychosis    Note:  This document was prepared using Dragon voice recognition software and may include unintentional dictation errors.   Dorothyann Drivers, MD 04/08/24 1351  "

## 2024-04-08 NOTE — ED Notes (Signed)
 Pt endorses paranoia,  states  I dont want to be in a room, I need you to be able to see me pt refuses to go to room. Pt placed in hall bed. Denies HI/SI but states I'm just worried for my family

## 2024-04-08 NOTE — BH Assessment (Signed)
 Comprehensive Clinical Assessment (CCA) Screening, Triage and Referral Note  04/08/2024 Bailey Cabrera [id number]  Chief Complaint:  Chief Complaint  Patient presents with   Mental Health Problem   Visit Diagnosis: Schizophrenia  Bailey Cabrera is a 34 year old female who presents to the ER, after she left AMA this morning (04/08/2024). Patient currently responding to internal stimuli. 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    Patient Reported Information How did you hear about us ? Self  What Is the Reason for Your Visit/Call Today? Patient recently in the ER, left AMA. Currently responding to internal stimuli.  How Long Has This Been Causing You Problems? 1 wk - 1 month  What Do You Feel Would Help You the Most Today? Treatment for Depression or other mood problem; Alcohol or Drug Use Treatment   Have You Recently Had Any Thoughts About Hurting Yourself? No  Are You Planning to Commit Suicide/Harm Yourself At This time? No   Have you Recently Had Thoughts About Hurting Someone Bailey Cabrera? No  Are You Planning to Harm Someone at This Time? No  Explanation: No data recorded  Have You Used Any Alcohol or Drugs in the Past 24 Hours? Yes  How Long Ago Did You Use Drugs or Alcohol? No data recorded What Did You Use and How Much? No data recorded  Do You Currently Have a Therapist/Psychiatrist? Yes  Name of Therapist/Psychiatrist: [facility]   Have You Been Recently Discharged From Any Office Practice or Programs? No  Explanation of Discharge From Practice/Program: No data recorded   CCA Screening Triage Referral Assessment Type of Contact: Face-to-Face  Telemedicine Service Delivery:   Is this Initial or Reassessment?   Date Telepsych consult ordered in [facility]:    Time Telepsych consult ordered in [facility]:    Location of Assessment: [facility] ED  Provider Location: [facility] ED    Collateral Involvement: No data recorded  Does Patient Have a Court Appointed Legal  Guardian? No data recorded Name and Contact of Legal Guardian: No data recorded If Minor and Not Living with Parent(s), Who has Custody? No data recorded Is CPS involved or ever been involved? Never  Is APS involved or ever been involved? Never   Patient Determined To Be At Risk for Harm To Self or Others Based on Review of Patient Reported Information or Presenting Complaint? No  Method: No data recorded Availability of Means: No data recorded Intent: No data recorded Notification Required: No data recorded Additional Information for Danger to Others Potential: No data recorded Additional Comments for Danger to Others Potential: No data recorded Are There Guns or Other Weapons in Your Home? No  Types of Guns/Weapons: No data recorded Are These Weapons Safely Secured?                            No data recorded Who Could Verify You Are Able To Have These Secured: No data recorded Do You Have any Outstanding Charges, Pending Court Dates, Parole/Probation? No data recorded Contacted To Inform of Risk of Harm To Self or Others: No data recorded  Does Patient Present under Involuntary Commitment? Yes   [place] of Residence: [place]   Patient Currently Receiving the Following Services: Medication Management   Determination of Need: Emergent (2 hours)   Options For Referral: ED Visit; Inpatient Hospitalization   Disposition Recommendation per psychiatric provider: Inpatient Treatment  Bailey DOROTHA Barge MS, LCAS, [facility] Therapeutic Triage Specialist 04/08/2024 12:08 PM

## 2024-04-08 NOTE — ED Notes (Addendum)
 Pt wont sit down, yelling I see the red light, yall are going to let them shoot me. Pt can not be verbally deescalated. Pt asked for phone to call the police. This RN informed pt that no one is going to hurt her and law enforcement is here. Pt was given the option for PO ativan . Pt declined and became increasingly agitated. This RN and Glass Blower/designer + Patent examiner at beside to administer medication. Pt attempted to bite this RN. Pt tolerated well and was able to give IM injection without issue after being explained the reasoning

## 2024-04-08 NOTE — ED Notes (Signed)
Vol /psych consult pending 

## 2024-04-08 NOTE — ED Notes (Signed)
 Pt requesting to leave stating I am leaving, give me my stuff. Im not gonna allow my husband to tell you stuff about me when he doesn't have the right to

## 2024-04-09 NOTE — ED Notes (Signed)
 Pt refusing snack but given drink at this time. Pt calm and cooperative.

## 2024-04-09 NOTE — ED Notes (Signed)
 Hospital meal provided, pt tolerated w/o complaints.  Waste discarded appropriately.

## 2024-04-09 NOTE — ED Notes (Addendum)
 Breakfast at bedside.

## 2024-04-09 NOTE — ED Provider Notes (Signed)
 Emergency Medicine Observation Re-evaluation Note  Bailey Cabrera is a 34 y.o. female, seen on rounds today.  Pt initially presented to the ED for complaints of Mental Health Problem Currently, the patient is resting.  Physical Exam  BP (!) 89/61 (BP Location: Left Arm)   Pulse 78   Temp (!) 96.4 F (35.8 C) (Axillary)   Resp 16   Ht 1.6 m (5' 3)   Wt 63.5 kg   SpO2 98%   BMI 24.80 kg/m  Physical Exam Gen:  No acute distress Resp:  Breathing easily and comfortably, no accessory muscle usage Neuro:  Moving all four extremities, no gross focal neuro deficits Psych:  Resting currently, calm when awake  ED Course / MDM  EKG:   I have reviewed the labs performed to date as well as medications administered while in observation.  Recent changes in the last 24 hours include initial EDP and psych assessments.  Plan  Current plan is for psych placement.    Gordan Huxley, MD 04/09/24 [phone]

## 2024-04-09 NOTE — ED Notes (Signed)
IVC/pending psych inpatient admission 

## 2024-04-10 NOTE — ED Notes (Signed)
 Patient sleeping

## 2024-04-10 NOTE — ED Notes (Signed)
 Meal provided

## 2024-04-10 NOTE — ED Notes (Signed)
 Hospital meal provided, pt tolerated w/o complaints.  Waste discarded appropriately.

## 2024-04-10 NOTE — ED Notes (Signed)
 IVC GOING TO  [place]  DUNES NO  TRANSPORT  DUE TO  WEATHER

## 2024-04-10 NOTE — ED Provider Notes (Addendum)
 Emergency Medicine Observation Re-evaluation Note  Bailey Cabrera is a 34 y.o. female, seen on rounds today.  Pt initially presented to the ED for complaints of Mental Health Problem  Currently, the patient is is no acute distress. Denies any concerns at this time.  Physical Exam  Blood pressure (!) 90/56, pulse 90, temperature 98.2 F (36.8 C), temperature source Oral, resp. rate 18, height 5' 3 (1.6 m), weight 63.5 kg, SpO2 99%, unknown if currently breastfeeding.  Physical Exam: General: No apparent distress Pulm: Normal WOB Neuro: Moving all extremities Psych: Resting comfortably     ED Course / MDM     I have reviewed the labs performed to date as well as medications administered while in observation.  Recent changes in the last 24 hours include: No acute events overnight.  Plan   Current plan: Patient awaiting psychiatric disposition.  The patient has been placed in psychiatric observation due to the need to provide a safe environment for the patient while obtaining psychiatric consultation and evaluation, as well as ongoing medical and medication management to treat the patient's condition.     Awaiting transportation for inpatient psychiatric treatment.   Patient has been accepted to [facility].  Patient assigned to [facility] Accepting physician is Dr. Odella Jumper.        Bailey Cabrera, Bailey SAILOR, DO 04/10/24 0406    Bailey Cabrera, Bailey SAILOR, DO 04/10/24 [phone]

## 2024-04-10 NOTE — BH Assessment (Signed)
 Writer called and spoke to [place] at [facility] [phone]. Informed transportation is not available on today and will try to send patient on tomm 04/11/24. Morna advised they will hold the bed.

## 2024-04-10 NOTE — ED Notes (Signed)
 Pt provided with breakfast tray. Pt sitting up eating

## 2024-04-11 MED ORDER — BUPRENORPHINE HCL-NALOXONE HCL 8-2 MG SL SUBL
1.0000 | SUBLINGUAL_TABLET | Freq: Every day | SUBLINGUAL | Status: DC
  Administered 2024-04-11: 1 via SUBLINGUAL
  Filled 2024-04-11: qty 1

## 2024-04-11 NOTE — ED Notes (Signed)
[place]  [employer]  called  for  transport  to  [employer]

## 2024-04-11 NOTE — BH Assessment (Signed)
 Patient has been accepted to [facility] on today 04/11/24. Patient assigned to room [facility]. Accepting physician is Dr. Millie Manners.  Call report to [phone].  Representative was [employer]   ER Staff is aware of it:  Olam, ER Secretary  Dr. Nicholaus, ER MD  Leonor, Patient's Nurse     Writer left message for Patient's Family/Support System Astrid Gardiner [phone]) to return call. Need to inform of new bed acceptance.

## 2024-04-11 NOTE — BH Assessment (Signed)
 Writer called and spoke to [place] in Admissions at [facility]. Informed that patient will not be able to come today 04/11/24. Alan stated they will hold patient's bed until transportation is available.

## 2024-04-11 NOTE — ED Notes (Signed)
 Patient sleeping

## 2024-04-11 NOTE — ED Notes (Signed)
 Pt transferred to [facility] by [facility] at this time. Pt aware of admission and transfer. Pt calm and cooperative. All belongings sent with [facility]. Report has been called.

## 2024-04-11 NOTE — ED Notes (Signed)
 IVC/ Accepted to [facility]- Bed being held; no transport due to weather; call for transport in a.m on 04/11/24.

## 2024-04-11 NOTE — ED Notes (Signed)
 Patient in assigned room watching tv

## 2024-04-11 NOTE — ED Provider Notes (Signed)
 Emergency Medicine Observation Re-evaluation Note  Bailey Cabrera is a 34 y.o. female, seen on rounds today.  Pt initially presented to the ED for complaints of Mental Health Problem  Currently, the patient is is no acute distress. Denies any concerns at this time.  Physical Exam  Blood pressure 107/81, pulse 74, temperature 98.1 F (36.7 C), resp. rate 16, height 5' 3 (1.6 m), weight 63.5 kg, SpO2 99%, unknown if currently breastfeeding.  Physical Exam: General: No apparent distress Pulm: Normal WOB Neuro: Moving all extremities Psych: Resting comfortably     ED Course / MDM     I have reviewed the labs performed to date as well as medications administered while in observation.  Recent changes in the last 24 hours include: No acute events overnight.  Plan   Current plan: Patient awaiting psychiatric disposition.  The patient has been placed in psychiatric observation due to the need to provide a safe environment for the patient while obtaining psychiatric consultation and evaluation, as well as ongoing medical and medication management to treat the patient's condition.      Carri Spillers, Josette SAILOR, DO 04/11/24 [phone]

## 2024-04-11 NOTE — BH Assessment (Signed)
 Writer called [employer] [phone] and spoke to [place]. Informed that patient no longer needs bed placement due to transportation issues. Tammy verbalized understanding and stated she will remove patient off their board.

## 2024-04-11 NOTE — ED Notes (Signed)
 Meal provided

## 2024-04-11 NOTE — BH Assessment (Signed)
 Patient referral faxed to:   [facility]  [phone]  [facility]  [phone]  [facility]  [phone]  [facility]  [phone]  [facility]  [phone]  [facility]  [phone]  [facility]  [phone]  [facility]  [phone]  [facility]  [phone]  [facility]  [phone]  [facility]  [phone]  [facility]  [phone]  [facility] [facility]  [phone]
